# Patient Record
Sex: Female | Born: 1955 | Race: White | Hispanic: No | Marital: Single | Smoking: Former smoker
Health system: Southern US, Community
[De-identification: ages and names within clinical notes are randomized; demographics above are authoritative.]

## PROBLEM LIST (undated history)

## (undated) DIAGNOSIS — F17200 Nicotine dependence, unspecified, uncomplicated: Secondary | ICD-10-CM

## (undated) DIAGNOSIS — K219 Gastro-esophageal reflux disease without esophagitis: Secondary | ICD-10-CM

## (undated) DIAGNOSIS — E669 Obesity, unspecified: Secondary | ICD-10-CM

## (undated) DIAGNOSIS — L409 Psoriasis, unspecified: Secondary | ICD-10-CM

## (undated) DIAGNOSIS — L4050 Arthropathic psoriasis, unspecified: Secondary | ICD-10-CM

## (undated) DIAGNOSIS — G40909 Epilepsy, unspecified, not intractable, without status epilepticus: Secondary | ICD-10-CM

## (undated) DIAGNOSIS — T7840XA Allergy, unspecified, initial encounter: Secondary | ICD-10-CM

## (undated) DIAGNOSIS — I10 Essential (primary) hypertension: Secondary | ICD-10-CM

## (undated) DIAGNOSIS — I8390 Asymptomatic varicose veins of unspecified lower extremity: Secondary | ICD-10-CM

## (undated) DIAGNOSIS — C801 Malignant (primary) neoplasm, unspecified: Secondary | ICD-10-CM

## (undated) HISTORY — DX: Allergy, unspecified, initial encounter: T78.40XA

## (undated) HISTORY — DX: Psoriasis, unspecified: L40.9

## (undated) HISTORY — DX: Arthropathic psoriasis, unspecified: L40.50

## (undated) HISTORY — PX: TONSILECTOMY, ADENOIDECTOMY, BILATERAL MYRINGOTOMY AND TUBES: SHX2538

## (undated) HISTORY — DX: Asymptomatic varicose veins of unspecified lower extremity: I83.90

## (undated) HISTORY — DX: Obesity, unspecified: E66.9

## (undated) HISTORY — DX: Nicotine dependence, unspecified, uncomplicated: F17.200

## (undated) HISTORY — PX: TUBAL LIGATION: SHX77

## (undated) HISTORY — DX: Malignant (primary) neoplasm, unspecified: C80.1

## (undated) HISTORY — DX: Gastro-esophageal reflux disease without esophagitis: K21.9

## (undated) HISTORY — DX: Epilepsy, unspecified, not intractable, without status epilepticus: G40.909

## (undated) HISTORY — PX: CATARACT EXTRACTION W/ INTRAOCULAR LENS IMPLANT: SHX1309

---

## 2002-08-27 ENCOUNTER — Ambulatory Visit (HOSPITAL_COMMUNITY): Admission: RE | Admit: 2002-08-27 | Discharge: 2002-08-27 | Payer: Self-pay | Admitting: Rheumatology

## 2002-08-27 ENCOUNTER — Encounter: Payer: Self-pay | Admitting: Rheumatology

## 2005-12-26 ENCOUNTER — Ambulatory Visit: Payer: Self-pay | Admitting: Family Medicine

## 2006-08-25 ENCOUNTER — Ambulatory Visit: Payer: Self-pay | Admitting: Family Medicine

## 2006-08-25 ENCOUNTER — Other Ambulatory Visit: Admission: RE | Admit: 2006-08-25 | Discharge: 2006-08-25 | Payer: Self-pay | Admitting: Family Medicine

## 2006-12-01 ENCOUNTER — Ambulatory Visit: Payer: Self-pay | Admitting: Family Medicine

## 2007-12-18 ENCOUNTER — Ambulatory Visit: Payer: Self-pay | Admitting: Family Medicine

## 2008-01-22 ENCOUNTER — Ambulatory Visit: Payer: Self-pay | Admitting: Family Medicine

## 2008-01-25 ENCOUNTER — Ambulatory Visit: Payer: Self-pay | Admitting: Physician Assistant

## 2008-06-09 ENCOUNTER — Ambulatory Visit: Payer: Self-pay | Admitting: Family Medicine

## 2008-06-13 ENCOUNTER — Ambulatory Visit: Payer: Self-pay | Admitting: Family Medicine

## 2009-01-02 ENCOUNTER — Other Ambulatory Visit: Admission: RE | Admit: 2009-01-02 | Discharge: 2009-01-02 | Payer: Self-pay | Admitting: Family Medicine

## 2009-01-02 ENCOUNTER — Ambulatory Visit: Payer: Self-pay | Admitting: Family Medicine

## 2009-01-23 LAB — HM COLONOSCOPY

## 2009-03-13 ENCOUNTER — Ambulatory Visit (HOSPITAL_COMMUNITY): Admission: RE | Admit: 2009-03-13 | Discharge: 2009-03-13 | Payer: Self-pay | Admitting: Rheumatology

## 2009-07-24 ENCOUNTER — Ambulatory Visit: Payer: Self-pay | Admitting: Family Medicine

## 2009-08-21 ENCOUNTER — Ambulatory Visit: Payer: Self-pay | Admitting: Family Medicine

## 2009-09-04 ENCOUNTER — Ambulatory Visit: Payer: Self-pay | Admitting: Family Medicine

## 2009-10-30 ENCOUNTER — Ambulatory Visit: Payer: Self-pay | Admitting: Family Medicine

## 2010-02-19 ENCOUNTER — Ambulatory Visit: Payer: Self-pay | Admitting: Physician Assistant

## 2010-05-28 ENCOUNTER — Ambulatory Visit: Payer: Self-pay | Admitting: Family Medicine

## 2010-10-25 ENCOUNTER — Telehealth: Payer: Self-pay | Admitting: Family Medicine

## 2010-10-25 DIAGNOSIS — K219 Gastro-esophageal reflux disease without esophagitis: Secondary | ICD-10-CM

## 2010-10-25 MED ORDER — DEXLANSOPRAZOLE 60 MG PO CPDR: 60.0000 mg | DELAYED_RELEASE_CAPSULE | Freq: Every day | ORAL | Status: DC

## 2010-11-23 NOTE — Telephone Encounter (Signed)
DONE

## 2011-01-03 ENCOUNTER — Encounter: Payer: Self-pay | Admitting: Family Medicine

## 2011-01-07 ENCOUNTER — Ambulatory Visit (INDEPENDENT_AMBULATORY_CARE_PROVIDER_SITE_OTHER): Payer: Managed Care, Other (non HMO) | Admitting: Family Medicine

## 2011-01-07 ENCOUNTER — Other Ambulatory Visit: Payer: Self-pay | Admitting: Family Medicine

## 2011-01-07 ENCOUNTER — Encounter: Payer: Self-pay | Admitting: Family Medicine

## 2011-01-07 VITALS — BP 112/70 | HR 52 | Ht 67.0 in | Wt 224.0 lb

## 2011-01-07 DIAGNOSIS — L408 Other psoriasis: Secondary | ICD-10-CM

## 2011-01-07 DIAGNOSIS — L409 Psoriasis, unspecified: Secondary | ICD-10-CM

## 2011-01-07 DIAGNOSIS — K219 Gastro-esophageal reflux disease without esophagitis: Secondary | ICD-10-CM

## 2011-01-07 DIAGNOSIS — G40909 Epilepsy, unspecified, not intractable, without status epilepticus: Secondary | ICD-10-CM

## 2011-01-07 DIAGNOSIS — I10 Essential (primary) hypertension: Secondary | ICD-10-CM

## 2011-01-07 DIAGNOSIS — Z0000 Encounter for general adult medical examination without abnormal findings: Secondary | ICD-10-CM

## 2011-01-07 DIAGNOSIS — L4050 Arthropathic psoriasis, unspecified: Secondary | ICD-10-CM | POA: Insufficient documentation

## 2011-01-07 DIAGNOSIS — Z79899 Other long term (current) drug therapy: Secondary | ICD-10-CM

## 2011-01-07 DIAGNOSIS — J301 Allergic rhinitis due to pollen: Secondary | ICD-10-CM | POA: Insufficient documentation

## 2011-01-07 DIAGNOSIS — E669 Obesity, unspecified: Secondary | ICD-10-CM | POA: Insufficient documentation

## 2011-01-07 LAB — POCT URINALYSIS DIPSTICK
Glucose, UA: NEGATIVE
Ketones, UA: NEGATIVE
Leukocytes, UA: NEGATIVE
Protein, UA: NEGATIVE
Urobilinogen, UA: NEGATIVE

## 2011-01-07 LAB — CBC WITH DIFFERENTIAL/PLATELET
Basophils Absolute: 0 K/uL (ref 0.0–0.1)
Basophils Relative: 0 % (ref 0–1)
Eosinophils Absolute: 0.1 K/uL (ref 0.0–0.7)
Eosinophils Relative: 3 % (ref 0–5)
HCT: 36.9 % (ref 36.0–46.0)
Hemoglobin: 11.9 g/dL — ABNORMAL LOW (ref 12.0–15.0)
Lymphocytes Relative: 43 % (ref 12–46)
Lymphs Abs: 2 K/uL (ref 0.7–4.0)
MCH: 31.2 pg (ref 26.0–34.0)
MCHC: 32.2 g/dL (ref 30.0–36.0)
MCV: 96.6 fL (ref 78.0–100.0)
Monocytes Absolute: 0.5 K/uL (ref 0.1–1.0)
Monocytes Relative: 11 % (ref 3–12)
Neutro Abs: 2 K/uL (ref 1.7–7.7)
Neutrophils Relative %: 43 % (ref 43–77)
Platelets: 248 K/uL (ref 150–400)
RBC: 3.82 MIL/uL — ABNORMAL LOW (ref 3.87–5.11)
RDW: 15 % (ref 11.5–15.5)
WBC: 4.6 K/uL (ref 4.0–10.5)

## 2011-01-07 LAB — COMPREHENSIVE METABOLIC PANEL WITH GFR
ALT: 17 U/L (ref 0–35)
AST: 18 U/L (ref 0–37)
Albumin: 4.5 g/dL (ref 3.5–5.2)
Alkaline Phosphatase: 87 U/L (ref 39–117)
BUN: 14 mg/dL (ref 6–23)
CO2: 27 meq/L (ref 19–32)
Calcium: 9.7 mg/dL (ref 8.4–10.5)
Chloride: 104 meq/L (ref 96–112)
Creat: 0.61 mg/dL (ref 0.50–1.10)
Glucose, Bld: 91 mg/dL (ref 70–99)
Potassium: 4.4 meq/L (ref 3.5–5.3)
Sodium: 141 meq/L (ref 135–145)
Total Bilirubin: 0.3 mg/dL (ref 0.3–1.2)
Total Protein: 7.9 g/dL (ref 6.0–8.3)

## 2011-01-07 LAB — LIPID PANEL
Cholesterol: 232 mg/dL — ABNORMAL HIGH (ref 0–200)
HDL: 105 mg/dL
LDL Cholesterol: 111 mg/dL — ABNORMAL HIGH (ref 0–99)
Total CHOL/HDL Ratio: 2.2 ratio
Triglycerides: 81 mg/dL
VLDL: 16 mg/dL (ref 0–40)

## 2011-01-07 NOTE — Patient Instructions (Signed)
Stay on your present medications. Make dietary adjustments especially in regard to her we'll hydrate.

## 2011-01-07 NOTE — Progress Notes (Signed)
  Subjective:    Patient ID: Kimberly Butler, female    DOB: Apr 27, 1956, 55 y.o.   MRN: [id number]  HPI She is here for a complete examination. She is being followed by her rheumatologist and recently had a PPD which was negative. She will need blood work for the immune modulating medication she is on. She continues on Dilantin and has not had difficulty with this. Her reflux symptoms are under good control with Dexilant. Her allergies are also under good control. Social history Rose reviewed. The main issue is that she had to put her dog down and 2 weeks later her mother died. Her mother was in hospice. She does seem to be handling this well but is having some difficulty with her brother. She helped take care of her mother and has been using walking is in good therapy. She has had some slight foot discomfort because of this.   Review of Systems  Constitutional: Negative.   HENT: Negative.   Eyes: Negative.   Respiratory: Negative.   Cardiovascular: Negative.   Gastrointestinal: Negative.  Negative for anal bleeding.  Genitourinary: Negative.   Musculoskeletal: Positive for arthralgias.       Objective:   Physical Exam BP 112/70  Pulse 52  Ht 5\' 7"  (1.702 m)  Wt 224 lb (101.606 kg)  BMI 35.08 kg/m2  General Appearance:    Alert, cooperative, no distress, appears stated age  Head:    Normocephalic, without obvious abnormality, atraumatic  Eyes:    PERRL, conjunctiva/corneas clear, EOM's intact, fundi    benign  Ears:    Normal TM's and external ear canals  Nose:   Nares normal, mucosa normal, no drainage or sinus   tenderness  Throat:   Lips, mucosa, and tongue normal; teeth and gums normal  Neck:   Supple, no lymphadenopathy;  thyroid:  no   enlargement/tenderness/nodules; no carotid   bruit or JVD  Back:    Spine nontender, no curvature, ROM normal, no CVA     tenderness  Lungs:     Clear to auscultation bilaterally without wheezes, rales or     ronchi; respirations unlabored  Chest  Wall:    No tenderness or deformity   Heart:    Regular rate and rhythm, S1 and S2 normal, no murmur, rub   or gallop  Breast Exam:    Deferred to GYN  Abdomen:     Soft, non-tender, nondistended, normoactive bowel sounds,    no masses, no hepatosplenomegaly  Genitalia:    Deferred to GYN     Extremities:   No clubbing, cyanosis or edema  Pulses:   2+ and symmetric all extremities  Skin:   Skin color, texture, turgor normal, no rashes or lesions  Lymph nodes:   Cervical, supraclavicular, and axillary nodes normal  Neurologic:   CNII-XII intact, normal strength, sensation and gait; reflexes 2+ and symmetric throughout          Psych:   Normal mood, affect, hygiene and grooming.           Assessment & Plan:  Seizure disorder. Psoriasis. Allergic rhinitis. GERD. Recent family stress. Obesity. I encouraged her to get involved with bereavement counseling through hospice. Encouraged her to continue with her physical activities and wear comfortable shoes to help with her foot discomfort. Continue on present medications. Routine blood screening also ordered.

## 2011-01-08 ENCOUNTER — Other Ambulatory Visit: Payer: Self-pay

## 2011-01-08 ENCOUNTER — Telehealth: Payer: Self-pay

## 2011-01-08 NOTE — Telephone Encounter (Signed)
Left message for pt she needed to take a multi vit other than that labs look good

## 2011-01-14 ENCOUNTER — Other Ambulatory Visit: Payer: Self-pay

## 2011-01-14 MED ORDER — OLMESARTAN MEDOXOMIL-HCTZ 20-12.5 MG PO TABS: 1.0000 | ORAL_TABLET | Freq: Every day | ORAL | Status: DC

## 2011-03-04 ENCOUNTER — Encounter: Payer: Self-pay | Admitting: Family Medicine

## 2011-03-04 ENCOUNTER — Other Ambulatory Visit: Payer: Self-pay | Admitting: Family Medicine

## 2011-03-04 ENCOUNTER — Ambulatory Visit (INDEPENDENT_AMBULATORY_CARE_PROVIDER_SITE_OTHER): Payer: Managed Care, Other (non HMO) | Admitting: Family Medicine

## 2011-03-04 VITALS — BP 110/70 | Temp 100.5°F | Wt 227.0 lb

## 2011-03-04 DIAGNOSIS — H6690 Otitis media, unspecified, unspecified ear: Secondary | ICD-10-CM

## 2011-03-04 DIAGNOSIS — L4050 Arthropathic psoriasis, unspecified: Secondary | ICD-10-CM

## 2011-03-04 DIAGNOSIS — H6691 Otitis media, unspecified, right ear: Secondary | ICD-10-CM

## 2011-03-04 MED ORDER — AMOXICILLIN 875 MG PO TABS: 875.0000 mg | ORAL_TABLET | Freq: Two times a day (BID) | ORAL | Status: AC

## 2011-03-04 NOTE — Patient Instructions (Signed)
Take all the antibiotic and if not totally back to normal ,call me. 

## 2011-03-04 NOTE — Progress Notes (Signed)
  Subjective:    Patient ID: Kimberly Butler, female    DOB: [birth year], 55 y.o.   MRN: [id number]  HPI She has a three-day history of sore throat, nasal congestion, headache, fatigue, fever and chills. No coughing. She does have underlying psoriatic arthritis and continues on her methotrexate. She does not smoke and has seasonal allergies.   Review of Systems     Objective:   Physical Exam alert and in no distress. Tympanic membrane on the right is dull and vascular, left is normal, canals are normal. Throat is clear. Tonsils are normal. Neck is supple without adenopathy or thyromegaly. Cardiac exam shows a regular sinus rhythm without murmurs or gallops. Lungs are clear to auscultation.        Assessment & Plan:   1. ROM (right otitis media)   2. Psoriatic arthritis    I will treat her with Amoxil. She will continue on her other medications. She will call if further difficulty.

## 2011-03-28 IMAGING — CR DG CHEST 2V
2 series · 2 of 2 positions shown · non-contrast
Comparison: None

CLINICAL DATA: Prior to starting immunosuppressive therapy for
arthritis.

CHEST - 2 VIEW

[w chest pa]
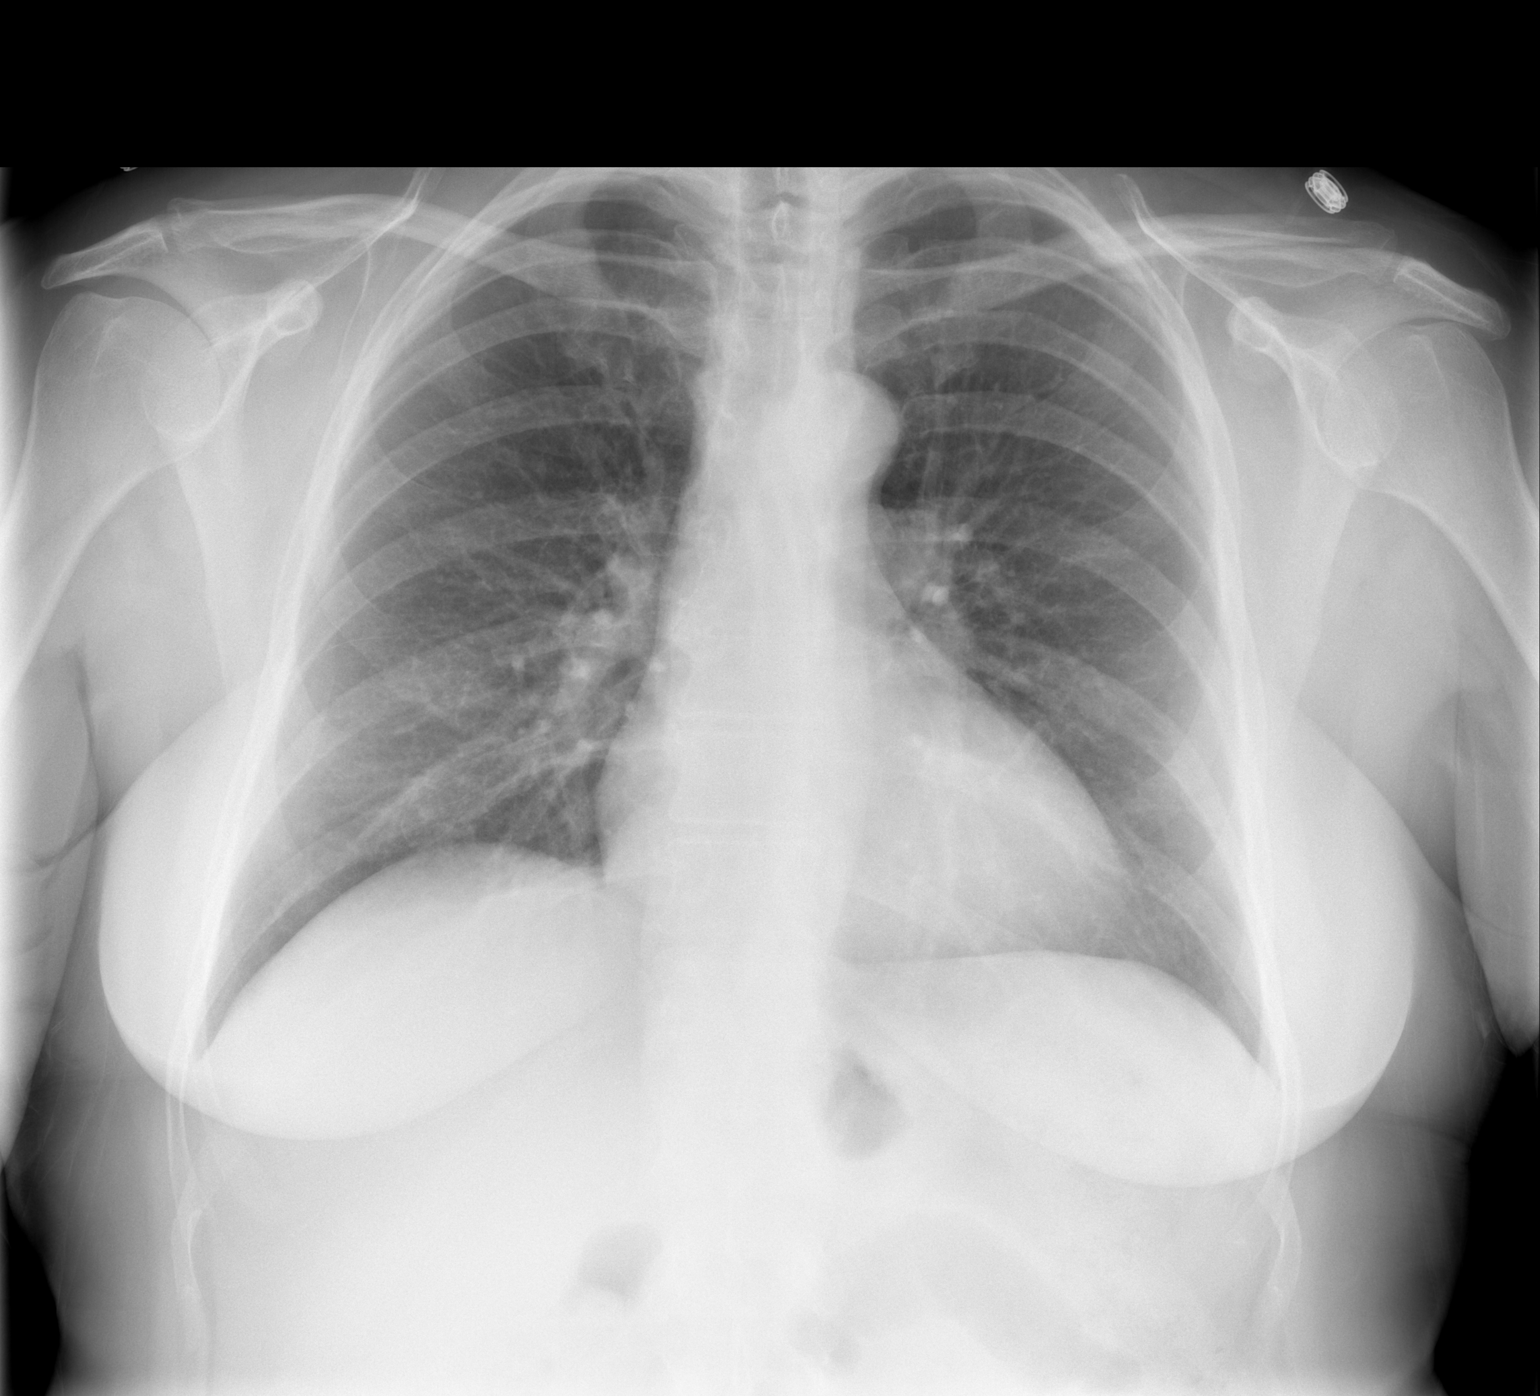

[w chest lat]
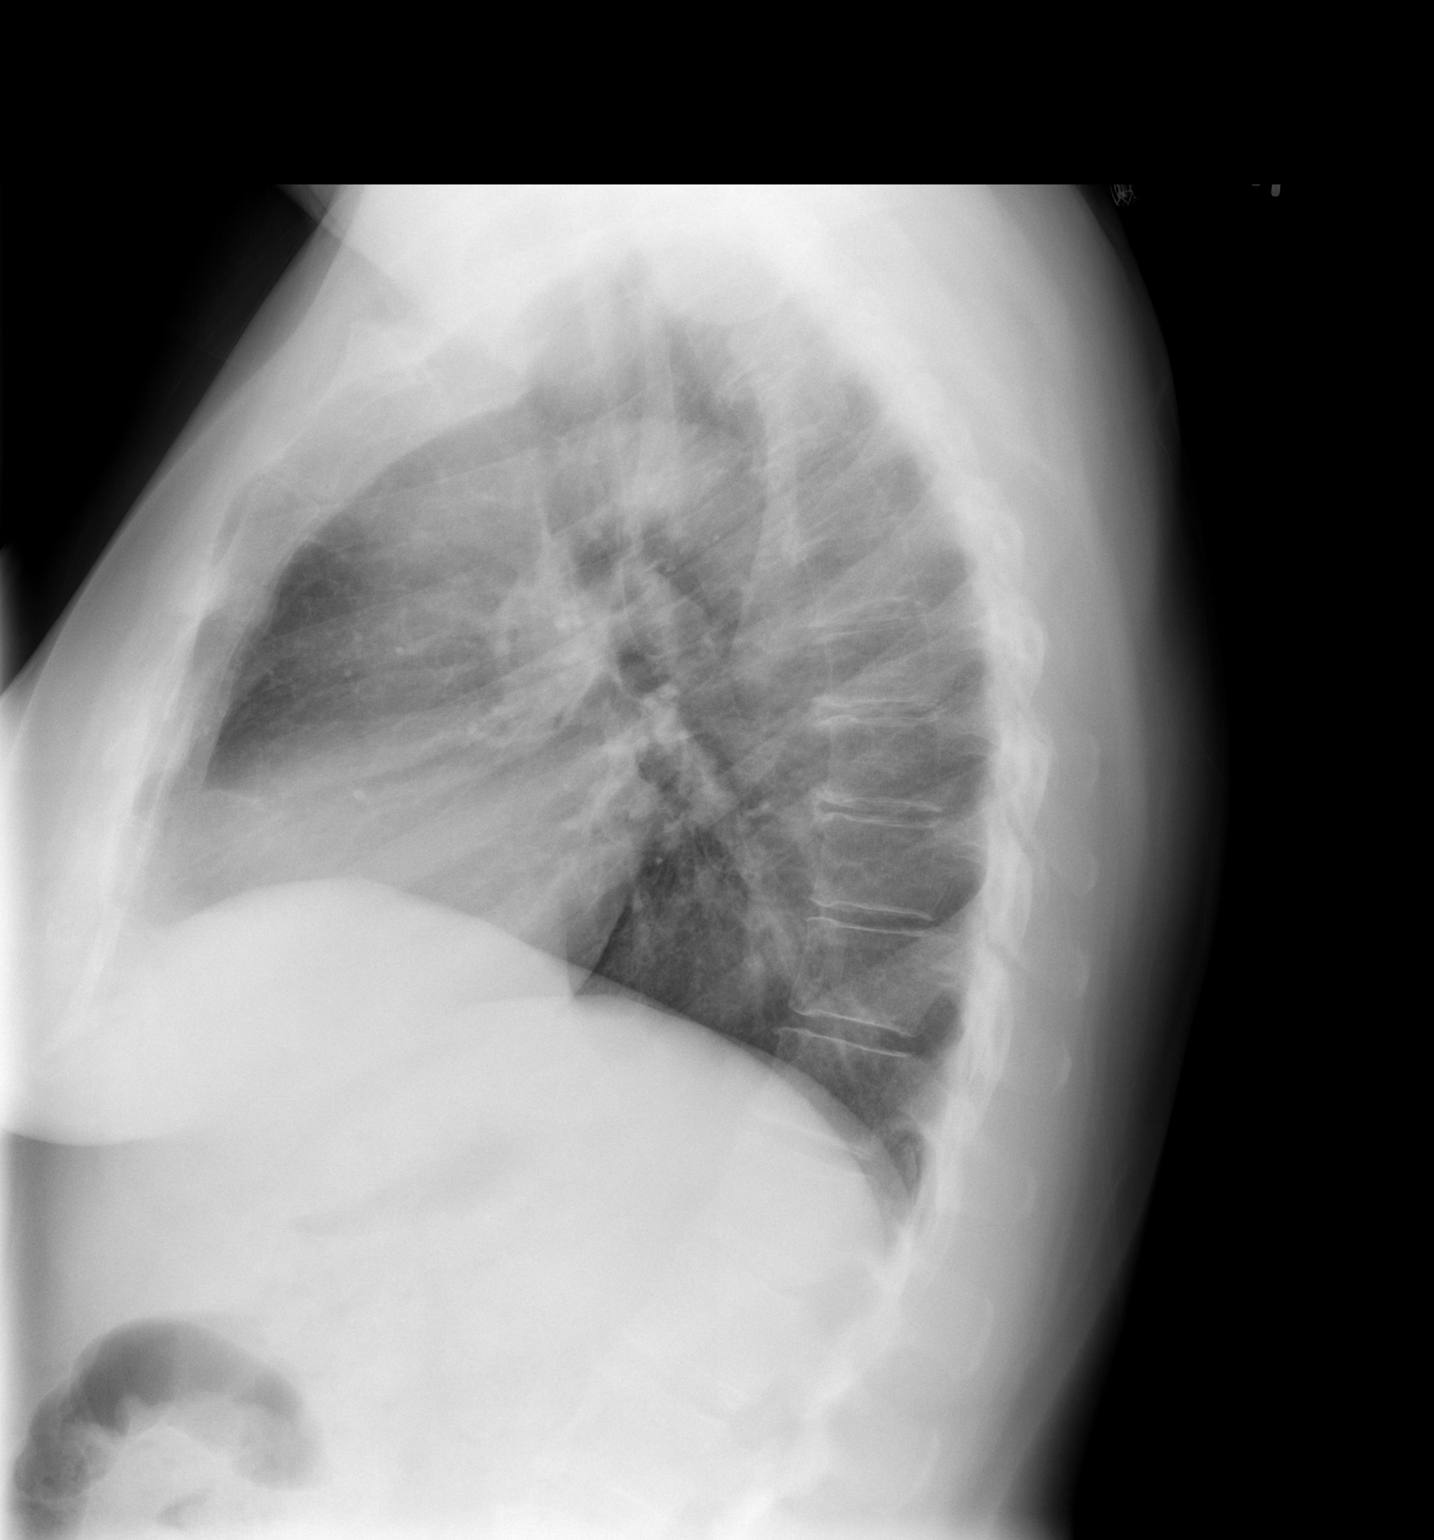

[2 of 2 positions shown; findings below may reference images not displayed]

FINDINGS: Heart size and mediastinal contours are normal.  Both
lungs are clear.  There is no evidence of pleural effusion.  No
mass or adenopathy identified.
IMPRESSION: No active cardiopulmonary disease.

## 2011-04-13 ENCOUNTER — Other Ambulatory Visit: Payer: Self-pay | Admitting: Family Medicine

## 2011-04-15 NOTE — Telephone Encounter (Signed)
Is this okay to refill? 

## 2011-05-15 ENCOUNTER — Other Ambulatory Visit: Payer: Self-pay | Admitting: Family Medicine

## 2011-07-30 ENCOUNTER — Encounter: Payer: Self-pay | Admitting: Medical

## 2011-07-30 ENCOUNTER — Ambulatory Visit (INDEPENDENT_AMBULATORY_CARE_PROVIDER_SITE_OTHER): Payer: Managed Care, Other (non HMO) | Admitting: Medical

## 2011-07-30 VITALS — BP 112/78 | HR 60 | Temp 98.5°F | Resp 16 | Wt 229.0 lb

## 2011-07-30 DIAGNOSIS — J40 Bronchitis, not specified as acute or chronic: Secondary | ICD-10-CM

## 2011-07-30 DIAGNOSIS — R509 Fever, unspecified: Secondary | ICD-10-CM

## 2011-07-30 DIAGNOSIS — R059 Cough, unspecified: Secondary | ICD-10-CM

## 2011-07-30 DIAGNOSIS — R05 Cough: Secondary | ICD-10-CM

## 2011-07-30 DIAGNOSIS — Z79899 Other long term (current) drug therapy: Secondary | ICD-10-CM

## 2011-07-30 MED ORDER — AMOXICILLIN-POT CLAVULANATE 875-125 MG PO TABS: 1.0000 | ORAL_TABLET | Freq: Two times a day (BID) | ORAL | Status: DC

## 2011-07-30 NOTE — Patient Instructions (Signed)
Acute Bronchitis Bronchitis is a problem of the air tubes leading to your lungs. Acute means the illness started quickly. In this condition, the lining of those tubes becomes puffy (swollen) and can leak fluid. This makes it harder for air to get in and out of your lungs. You may cough a lot. This is because the air tubes are narrow. Bronchitis is most often caused by a virus. Medicines that kill germs (antibiotics) may be needed with germ (bacteria) infections for people who:  Smoke.   Have lasting (chronic) lung problems.   Are elderly.  HOME CARE  Rest.   Drink enough water and fluids to keep the pee clear or pale yellow.   Only take medicine as told by your doctor.   Medicines may be prescribed that will open up the airways. This will help make breathing easier.   Bronchitis usually gets better on its own in a few days.  Recovery from some problems (symptoms) of bronchitis may be slow. You should start feeling a little better after 2 to 3 days. Coughing may last for 3 to 4 weeks. GET HELP RIGHT AWAY IF:  You or your child has a temperature by mouth above 101, not controlled by medicine.   Chills or chest pain develops.   You or your child develops very bad shortness of breath.   There is bloody saliva mixed with mucus (sputum).   You or your child throws up (vomits) often, loses too much fluid (dehydration), feels faint, or has a very bad headache.   You or your child does not improve after 1 week of treatment.  MAKE SURE YOU:   Understand these instructions.   Will watch this condition.   Will get help right away if you or your child is not doing well or gets worse.  Document Released: 11/13/2007 Document Re-Released: 08/21/2009 [employer] Patient Information 2011 [employer].  

## 2011-07-30 NOTE — Progress Notes (Signed)
Subjective:  Kimberly Butler is a 57 y.o. female who presents for chest congestion.  She started feeling bad last Thursday about 5 days ago with itchy throat, back pain and chest pressure.  Then started having cough, chest congestion, nasal congestion, hoarseness, ears popping, and began having fever 101.2 last night and chills.  She works around [employer] so is around sick contacts.   She denies recent surgery, long travel, no bed ridden statu, no hx/o DVT or PE, but she is on Enbrel and sees Kimberly Butler/rheumatology regularly.  She die have a flu shot this year.   She is a former smoker.  Using Tylenol and Mucinex DM for symptoms. No other aggravating or relieving factors.  No other c/o.  The following portions of the patient's history were reviewed and updated as appropriate: allergies, current medications, past family history, past medical history, past social history, past surgical history and problem list.  Past Medical History  Diagnosis Date  . Epilepsy   . Psoriasis   . Varicose veins   . Smoker   . Obesity   . Allergy     RHINITIS  . GERD (gastroesophageal reflux disease)   . Cancer     BASAL CELL   ROS: Gen: +fever and chills, no weight changes Skin: no rash Heart: no CP, palpitations, but +longstanding edema, no worse than usual Lungs: no SOB or wheezing GI: no pain, NVD GU: negative   Objective:   Filed Vitals:   07/30/11 0936  BP: 112/78  Pulse: 60  Temp: 98.5 F (36.9 C)  Resp: 16    General appearance: Alert, WD/WN, no distress, ill appearing                             Skin: warm, no rash, no diaphoresis                           Head: + sinus tenderness                            Eyes: conjunctiva normal, corneas clear, PERRLA                            Ears: TMs with mild serous fluid behind TMs bilat, external ear canals normal                          Nose: septum midline, turbinates swollen, with erythema and clear discharge             Mouth/throat:  MMM, tongue normal, mild pharyngeal erythema                           Neck: supple, no adenopathy, no thyromegaly, nontender                          Heart: RRR, normal S1, S2, no murmurs                         Lungs: +bronchial breath sounds, +scattered rhonchi, no wheezes, no rales                Extremities: no edema, nontender    CXR: no obvious pneumonia, cardiomegaly,  or acute findings.  Some hilar congestion suggestive of bronchitis.  Will send for over read.  Assessment and Plan:   Encounter Diagnoses  Name Primary?  . Cough Yes  . Fever   . High risk medication use    Prescription given today for Augmentin as below.  Discussed diagnosis and treatment of bronchitis.  Suggested symptomatic OTC remedies for cough and congestion.  Nasal saline spray for nasal congestion.  Call/return in 2-3 days if symptoms are worse or not improving.   Call report in 2-3 days to let me know how she is doing.

## 2011-08-05 ENCOUNTER — Other Ambulatory Visit: Payer: Self-pay | Admitting: Medical

## 2011-08-05 ENCOUNTER — Telehealth: Payer: Self-pay | Admitting: Family Medicine

## 2011-08-05 MED ORDER — LEVOFLOXACIN 500 MG PO TABS: 500.0000 mg | ORAL_TABLET | Freq: Every day | ORAL | Status: AC

## 2011-08-05 NOTE — Telephone Encounter (Signed)
Message copied by Janeice Robinson on Mon Aug 05, 2011 10:51 AM ------      Message from: Aleen Campi, DAVID S      Created: Mon Aug 05, 2011  8:01 AM       Chest xray over read suggests pneumonia.  Call and see how she is doing?  Radiology recommended repeat CXR in 8-10 weeks to make sure the finding on xray resolve.  Thus, I recommend CXR repeat 8-10 wk, strongly consider stopping tobacco, and if not improving let me know.

## 2011-08-05 NOTE — Telephone Encounter (Signed)
Patient states that her chest still feels full, she has no taste buds or smell of food, and she is still coughing but she is sleeping better. She states that she has stop her arithrists medication. CLS     Patient was notified of her xray report. CLS

## 2011-08-06 NOTE — Telephone Encounter (Signed)
I called the patient per Kristian Covey PA-C to let her know that he was going to send out a new antibotic to the pharmacy for her. CLS

## 2011-08-16 ENCOUNTER — Encounter: Payer: Self-pay | Admitting: Medical

## 2011-08-16 ENCOUNTER — Ambulatory Visit (INDEPENDENT_AMBULATORY_CARE_PROVIDER_SITE_OTHER): Payer: Managed Care, Other (non HMO) | Admitting: Medical

## 2011-08-16 VITALS — BP 112/78 | HR 58 | Temp 98.0°F | Resp 14 | Wt 230.0 lb

## 2011-08-16 DIAGNOSIS — R21 Rash and other nonspecific skin eruption: Secondary | ICD-10-CM

## 2011-08-16 DIAGNOSIS — I498 Other specified cardiac arrhythmias: Secondary | ICD-10-CM

## 2011-08-16 DIAGNOSIS — R001 Bradycardia, unspecified: Secondary | ICD-10-CM

## 2011-08-16 DIAGNOSIS — J189 Pneumonia, unspecified organism: Secondary | ICD-10-CM

## 2011-08-16 NOTE — Progress Notes (Signed)
Subjective: Here for f/u. I saw her about 2 weeks ago for possible pneumonia.  She took the Levaquin and now feels fine.   She has had no more cough or fever.   She is read to restart her Enbrel and Methotrexate but needs clearance from [place].  She has developed a small rash on back of right leg.  The lower leg is itchy.  Its the same leg she has chronic swelling in.  No other new c/o.    Past Medical History  Diagnosis Date  . Epilepsy   . Psoriasis   . Varicose veins   . Smoker   . Obesity   . Allergy     RHINITIS  . GERD (gastroesophageal reflux disease)   . Cancer     BASAL CELL   ROS: Gen: no fever, chills, wt changes Skin: right lower leg rash Heart: no CP, palpitations, but occasional DOE, she attributes to deconditioning Lungs: no SOB, wheezing GI: negative    Objective:   Physical Exam  Filed Vitals:   08/16/11 0814  BP: 112/78  Pulse: 58  Temp: 98 F (36.7 C)  Resp: 14    General appearance: alert, no distress, WD/WN Skin: right popliteal region with patch of flat erythema, few scattered 1-2 mm flat areas of erythema of right lower leg, but no other worrisome lesions Oral cavity: MMM, no lesions Neck: supple, no lymphadenopathy, no thyromegaly, no masses Heart: bradycardia, but otherwise RRR, normal S1, S2, no murmurs Lungs: CTA bilaterally, no wheezes, rhonchi, or rales Extremities:  Chronic right lower leg edema asymmetric compared to left lower leg Pulses: 1+ symmetric LE   Assessment and Plan :    Encounter Diagnoses  Name Primary?  . Pneumonia Yes  . Rash   . Bradycardia     Pneumonia - resolved.  We discussed her recent CXR over read findings.  Per radiology, repeat CXR in 8 weeks to demonstrate clearance/ no other abnormalities.  She can restart her Enbrel and Methotrexate now.  Rash - topical OTC hydrocortisone cream TID, c/t lotion regularly, and call if not resolving.  Bradycardia - not new, similar to prior visits.  We discussed her high  risk medications.   She wants to have EKG when she returns for repeat CXR in 8 wks.  She does note prior stress test about 5-7 years ago in [facility].  Follow-up 8 wk.

## 2011-08-17 ENCOUNTER — Other Ambulatory Visit: Payer: Self-pay | Admitting: Family Medicine

## 2011-09-30 ENCOUNTER — Encounter: Payer: Self-pay | Admitting: Internal Medicine

## 2011-10-07 ENCOUNTER — Encounter: Payer: Self-pay | Admitting: Family Medicine

## 2011-10-07 ENCOUNTER — Ambulatory Visit (INDEPENDENT_AMBULATORY_CARE_PROVIDER_SITE_OTHER): Payer: Managed Care, Other (non HMO) | Admitting: Family Medicine

## 2011-10-07 VITALS — BP 122/82 | HR 68 | Wt 232.0 lb

## 2011-10-07 DIAGNOSIS — R9389 Abnormal findings on diagnostic imaging of other specified body structures: Secondary | ICD-10-CM

## 2011-10-07 DIAGNOSIS — R918 Other nonspecific abnormal finding of lung field: Secondary | ICD-10-CM

## 2011-10-07 NOTE — Progress Notes (Signed)
  Subjective:    Patient ID: Kimberly Butler, female    DOB: 1955-08-15, 56 y.o.   MRN: [id number]  HPI She was seen approximately 2 months ago and treated for possible pneumonia. The x-ray did show questionable changes and recommended followup. Presently she is having no chest pain, shortness of breath, coughing or congestion.   Review of Systems     Objective:   Physical Exam Alert and in no distress. Lungs are clear to auscultation. Cardiac exam shows regular rhythm without murmurs or gallops.       Assessment & Plan:   1. Abnormal chest x-ray    the chest x-ray appears normal however I will wait for radiology to read this.

## 2011-10-11 ENCOUNTER — Encounter: Payer: Self-pay | Admitting: Family Medicine

## 2011-10-22 ENCOUNTER — Other Ambulatory Visit: Payer: Self-pay | Admitting: Family Medicine

## 2011-10-22 NOTE — Telephone Encounter (Signed)
Is this ok?

## 2011-10-22 NOTE — Telephone Encounter (Signed)
I called the medicine in 

## 2011-10-22 NOTE — Telephone Encounter (Signed)
Dexilant called in

## 2011-11-06 ENCOUNTER — Other Ambulatory Visit: Payer: Self-pay | Admitting: Family Medicine

## 2011-11-06 NOTE — Telephone Encounter (Signed)
Is this ok?

## 2011-11-06 NOTE — Telephone Encounter (Signed)
Have her set up an appointment before the prescription runs out. I just called 90 day supply

## 2011-11-06 NOTE — Telephone Encounter (Signed)
Left message for pt on home phone to please call and set up an appt

## 2011-12-09 ENCOUNTER — Telehealth: Payer: Self-pay | Admitting: Family Medicine

## 2011-12-11 NOTE — Telephone Encounter (Signed)
P.A. FOR BENICAR DENIED.  APPT 7/8

## 2011-12-16 ENCOUNTER — Encounter: Payer: Self-pay | Admitting: Family Medicine

## 2011-12-16 ENCOUNTER — Ambulatory Visit (INDEPENDENT_AMBULATORY_CARE_PROVIDER_SITE_OTHER): Payer: Managed Care, Other (non HMO) | Admitting: Family Medicine

## 2011-12-16 ENCOUNTER — Other Ambulatory Visit: Payer: Self-pay

## 2011-12-16 VITALS — BP 122/80 | HR 46 | Wt 232.0 lb

## 2011-12-16 DIAGNOSIS — G5602 Carpal tunnel syndrome, left upper limb: Secondary | ICD-10-CM

## 2011-12-16 DIAGNOSIS — G5600 Carpal tunnel syndrome, unspecified upper limb: Secondary | ICD-10-CM

## 2011-12-16 DIAGNOSIS — G40909 Epilepsy, unspecified, not intractable, without status epilepticus: Secondary | ICD-10-CM

## 2011-12-16 DIAGNOSIS — E669 Obesity, unspecified: Secondary | ICD-10-CM

## 2011-12-16 DIAGNOSIS — I10 Essential (primary) hypertension: Secondary | ICD-10-CM

## 2011-12-16 MED ORDER — LISINOPRIL-HYDROCHLOROTHIAZIDE 10-12.5 MG PO TABS: 1.0000 | ORAL_TABLET | Freq: Every day | ORAL | Status: DC

## 2011-12-16 MED ORDER — GLUCOSE BLOOD VI STRP: ORAL_STRIP | Status: DC

## 2011-12-16 MED ORDER — FREESTYLE LANCETS MISC: Status: DC

## 2011-12-16 NOTE — Progress Notes (Signed)
  Subjective:    Patient ID: Kimberly Butler, female    DOB: 17-Aug-1955, 56 y.o.   MRN: [id number]  HPI She is here for medication management visit. She complains of left wrist and shoulder discomfort. She describes a pain in her wrist was driving and also at night. She has been using a splint which he says helps. She also states that sometimes when she lies on the left shoulder, she will have pain that radiates down her arm. Lying flat on her back does not cause difficulty. She continues on her Dilantin for an underlying seizure disorder. She is also on blood pressure medication needs to be switched due to cost.she continues on methotrexate.   Review of Systems     Objective:   Physical Exam        Assessment & Plan:   1. Carpal tunnel syndrome of left wrist    2. Seizure disorder  Phenytoin level, total  3. Hypertension  lisinopril-hydrochlorothiazide (PRINZIDE,ZESTORETIC) 10-12.5 MG per tablet  4. Obesity (BMI 30-39.9)     I will switch her to lisinopril. Discussed diet and exercise with her to reduce her chances of becoming a diabetic. She will continue on her other medications. Recheck here in one month.

## 2011-12-16 NOTE — Telephone Encounter (Signed)
SENT IN LANCETS AND TEST STRIPS FREE STYLE

## 2011-12-17 ENCOUNTER — Other Ambulatory Visit: Payer: Self-pay

## 2011-12-17 MED ORDER — DEXLANSOPRAZOLE 60 MG PO CPDR: 60.0000 mg | DELAYED_RELEASE_CAPSULE | Freq: Every day | ORAL | Status: DC

## 2011-12-17 MED ORDER — DILANTIN 100 MG PO CAPS: ORAL_CAPSULE | ORAL | Status: DC

## 2011-12-17 NOTE — Telephone Encounter (Signed)
Sent med in per jcl 

## 2011-12-24 ENCOUNTER — Other Ambulatory Visit: Payer: Self-pay

## 2011-12-24 NOTE — Telephone Encounter (Signed)
Error sent diabetes supply for pt

## 2012-01-13 ENCOUNTER — Ambulatory Visit: Payer: Managed Care, Other (non HMO) | Admitting: Family Medicine

## 2012-01-20 ENCOUNTER — Other Ambulatory Visit: Payer: Self-pay | Admitting: Family Medicine

## 2012-02-17 ENCOUNTER — Telehealth: Payer: Self-pay | Admitting: Internal Medicine

## 2012-02-17 ENCOUNTER — Ambulatory Visit (INDEPENDENT_AMBULATORY_CARE_PROVIDER_SITE_OTHER): Payer: Managed Care, Other (non HMO) | Admitting: Family Medicine

## 2012-02-17 ENCOUNTER — Encounter: Payer: Self-pay | Admitting: Family Medicine

## 2012-02-17 VITALS — BP 122/80 | HR 56 | Wt 235.0 lb

## 2012-02-17 DIAGNOSIS — I10 Essential (primary) hypertension: Secondary | ICD-10-CM

## 2012-02-17 DIAGNOSIS — L409 Psoriasis, unspecified: Secondary | ICD-10-CM

## 2012-02-17 DIAGNOSIS — J301 Allergic rhinitis due to pollen: Secondary | ICD-10-CM

## 2012-02-17 DIAGNOSIS — L408 Other psoriasis: Secondary | ICD-10-CM

## 2012-02-17 MED ORDER — MOMETASONE FUROATE 50 MCG/ACT NA SUSP: 2.0000 | Freq: Every day | NASAL | Status: DC

## 2012-02-17 NOTE — Telephone Encounter (Signed)
Done

## 2012-02-17 NOTE — Progress Notes (Signed)
  Subjective:    Patient ID: Kimberly Butler, female    DOB: [birth year], 56 y.o.   MRN: [id number]  HPI She is here for blood pressure recheck. She was switched on her last visit to lisinopril. She has no side effects specifically coughing. She also complains of a one-year history decreased sensation of smell. She does note some sinus pressure, PND. No fever, chills, headache. She does take Claritin and Nasonex. She does note that when she takes her methotrexate, her allergy symptoms worsen.   Review of Systems     Objective:   Physical Exam alert and in no distress. Tympanic membranes and canals are normal. Throat is clear. Tonsils are normal. Neck is supple without adenopathy or thyromegaly. Cardiac exam shows a regular sinus rhythm without murmurs or gallops. Lungs are clear to auscultation. Nasal mucosa is normal with no tenderness over sinuses       Assessment & Plan:   1. Psoriasis   2. Hypertension   3. Allergic rhinitis due to pollen    continue present medication regimen but use the allergies meds on a regular basis. If she has difficulty, she will call me for possibly switching to a different regimen.

## 2012-02-17 NOTE — Addendum Note (Signed)
Addended by: Barbette Or A on: 02/17/2012 10:40 AM   Modules accepted: Orders

## 2012-02-17 NOTE — Telephone Encounter (Signed)
Needs a refill on nasonex sent to the pharmacy

## 2012-02-17 NOTE — Patient Instructions (Signed)
Use the Claritin and Nasonex regularly and if that stops working let me know

## 2012-03-11 ENCOUNTER — Ambulatory Visit (INDEPENDENT_AMBULATORY_CARE_PROVIDER_SITE_OTHER): Payer: Managed Care, Other (non HMO) | Admitting: Medical

## 2012-03-11 ENCOUNTER — Encounter: Payer: Self-pay | Admitting: Medical

## 2012-03-11 ENCOUNTER — Other Ambulatory Visit: Payer: Self-pay | Admitting: Medical

## 2012-03-11 VITALS — BP 122/80 | HR 60 | Temp 97.9°F | Resp 16 | Wt 230.0 lb

## 2012-03-11 DIAGNOSIS — R21 Rash and other nonspecific skin eruption: Secondary | ICD-10-CM

## 2012-03-11 DIAGNOSIS — R609 Edema, unspecified: Secondary | ICD-10-CM

## 2012-03-11 DIAGNOSIS — L509 Urticaria, unspecified: Secondary | ICD-10-CM

## 2012-03-11 DIAGNOSIS — R600 Localized edema: Secondary | ICD-10-CM

## 2012-03-11 DIAGNOSIS — Z79899 Other long term (current) drug therapy: Secondary | ICD-10-CM

## 2012-03-11 LAB — CBC WITH DIFFERENTIAL/PLATELET
Basophils Absolute: 0 10*3/uL (ref 0.0–0.1)
Basophils Relative: 0 % (ref 0–1)
Eosinophils Absolute: 0.2 10*3/uL (ref 0.0–0.7)
Eosinophils Relative: 4 % (ref 0–5)
MCH: 31.6 pg (ref 26.0–34.0)
MCHC: 33.6 g/dL (ref 30.0–36.0)
Neutrophils Relative %: 46 % (ref 43–77)
Platelets: 262 10*3/uL (ref 150–400)
RBC: 3.73 MIL/uL — ABNORMAL LOW (ref 3.87–5.11)
RDW: 14.2 % (ref 11.5–15.5)

## 2012-03-11 LAB — COMPREHENSIVE METABOLIC PANEL
ALT: 15 U/L (ref 0–35)
Alkaline Phosphatase: 77 U/L (ref 39–117)
Creat: 0.61 mg/dL (ref 0.50–1.10)
Sodium: 139 mEq/L (ref 135–145)
Total Bilirubin: 0.3 mg/dL (ref 0.3–1.2)
Total Protein: 7.2 g/dL (ref 6.0–8.3)

## 2012-03-11 MED ORDER — LOSARTAN POTASSIUM-HCTZ 50-12.5 MG PO TABS: 1.0000 | ORAL_TABLET | Freq: Every day | ORAL | Status: DC

## 2012-03-11 NOTE — Progress Notes (Signed)
Subjective: Here for c/o rash and swelling.   Sunday started having swelling of left eyelid, but then eyes started watering that night.  Next morning orbit seemed swollen on the left.  Later that day started taking benadryl, but then yesterday afternoon started getting bumps on right arm and then rash began on neck, chin, both arms.  No rash on legs, torso.  Rash is itchy/burning.  The only recent change was that Lisinopril HCT was added few months ago since insurer would no longer cover Benicar HCT.  Past Medical History  Diagnosis Date  . Epilepsy   . Psoriasis   . Varicose veins   . Smoker   . Obesity   . Allergy     RHINITIS  . GERD (gastroesophageal reflux disease)   . Cancer     BASAL CELL  . Psoriatic arthritis     Review of Systems Constitutional: -fever, -chills, -sweats ENT: -runny nose, -ear pain, -sore throat Cardiology:  -chest pain, -palpitations, -edema Respiratory: -cough, -shortness of breath, -wheezing Gastroenterology: -abdominal pain, -nausea, -vomiting, -diarrhea, -constipation  Musculoskeletal: -arthralgias, -myalgias, -joint swelling, -back pain Ophthalmology: -vision changes Urology: -dysuria, -difficulty urinating, -hematuria, -urinary frequency, -urgency Neurology: -headache, -weakness, -tingling, -numbness     Objective:   Physical Exam  Filed Vitals:   03/11/12 0820  BP: 122/80  Pulse: 60  Temp: 97.9 F (36.6 C)  Resp: 16    General appearance: alert, no distress, WD/WN Skin: urticarial appearing erythematous rash broadly of chin, neck and to lesser extent patches on forearms, orbits and face seem slightly swollen, lips not particularly swollen HEENT: normocephalic, sclerae anicteric, conjunctiva normal appearing, TMs pearly, nares patent, no discharge or erythema, pharynx normal Oral cavity: MMM, no lesions Neck: supple, no lymphadenopathy, no thyromegaly, no masses Heart: RRR, normal S1, S2, no murmurs Lungs: CTA bilaterally, no wheezes,  rhonchi, or rales MSK: nontender Neuro: normal strength  Assessment and Plan :    Encounter Diagnoses  Name Primary?  . Periorbital edema Yes  . Rash   . Urticaria   . Encounter for long-term (current) use of other medications    i called and discussed rash with Dr. Corliss Skains, patient's rheumatologist.  She did not feel like this related to etanercept or methotrexate or dermatomyositis.   At this point we will check some labs as she is due for monitoring, will have her increase benadryl to TID for the next 2-3 days, and we will stop lisinopril HCT and change to Losartan HCT in the event this is related to recent onset of lisinopril.   Advised she call Friday for symptoms update, and call/return in the meantime if new or worsening symptoms.  Follow-up pending labs

## 2012-03-13 ENCOUNTER — Telehealth: Payer: Self-pay | Admitting: Internal Medicine

## 2012-03-13 ENCOUNTER — Other Ambulatory Visit: Payer: Self-pay | Admitting: Medical

## 2012-03-13 LAB — IRON AND TIBC
Iron: 94 ug/dL (ref 42–145)
UIBC: 189 ug/dL (ref 125–400)

## 2012-03-13 MED ORDER — PREDNISONE 10 MG PO TABS: ORAL_TABLET | ORAL | Status: DC

## 2012-03-13 NOTE — Telephone Encounter (Signed)
Pt notified and shane talked to pt

## 2012-03-13 NOTE — Telephone Encounter (Signed)
Pt is not better. Pt is swollen and rash is still on face. And wants to know what to do. [facility] [place]

## 2012-03-13 NOTE — Addendum Note (Signed)
Addended by: Dayle Points on: 03/13/2012 11:22 AM   Modules accepted: Orders

## 2012-03-13 NOTE — Telephone Encounter (Signed)
Pt called back.  She notes that her neck rash has actually improved, but she notes that her psoriasis patches seem to be a little worse than usual.  Recently was cutting back on methotrexate per her rheumatologist, but she has concerns about what to do next.  After discussing this with her, advised she not begin the prednisone taper and increase methotrexate back to where it was prior.  Call report monday

## 2012-03-13 NOTE — Telephone Encounter (Signed)
If rash is no better, then hold off on Enbrel for a few days and begin short taper of prednisone steroid.  Call back Monday.  If rash still not going away, then consider biopsy of rash here or referral to dermatology.

## 2012-03-23 ENCOUNTER — Encounter: Payer: Self-pay | Admitting: Family Medicine

## 2012-04-22 ENCOUNTER — Encounter: Payer: Self-pay | Admitting: Medical

## 2012-04-22 ENCOUNTER — Ambulatory Visit (INDEPENDENT_AMBULATORY_CARE_PROVIDER_SITE_OTHER): Payer: Managed Care, Other (non HMO) | Admitting: Medical

## 2012-04-22 ENCOUNTER — Encounter: Payer: Self-pay | Admitting: Family Medicine

## 2012-04-22 VITALS — BP 122/70 | HR 60 | Temp 97.5°F | Resp 16 | Wt 233.0 lb

## 2012-04-22 DIAGNOSIS — H6980 Other specified disorders of Eustachian tube, unspecified ear: Secondary | ICD-10-CM

## 2012-04-22 DIAGNOSIS — H6990 Unspecified Eustachian tube disorder, unspecified ear: Secondary | ICD-10-CM

## 2012-04-22 DIAGNOSIS — R42 Dizziness and giddiness: Secondary | ICD-10-CM

## 2012-04-22 LAB — GLUCOSE, POCT (MANUAL RESULT ENTRY): POC Glucose: 77 mg/dl (ref 70–99)

## 2012-04-22 NOTE — Progress Notes (Signed)
Subjective: Been getting dizzy the last 2 weeks.  getting worse.  Took her dog to the vet this morning, felt dizzy driving.   Room seemed to be spinning around while in the waiting room at the vet.  Has felt tired and fatigued.  This has started to become a daily symptom.  Initially was once weekly.  Had it 3 times this morning.  Usually lasts for seconds, but this morning longer.  Didn't seem to go away this morning.  Denies dizziness currently, but is fatigued.   Hasn't been sleeping good at night.  Sometimes gets nauseated with the dizzy spells.  Denies syncope, no palpations.  All seems to be in her head.  Has chronic numbness of left arm, but no new numbness, tingling, weakness.  No vision changes. No hearing changes, no slurred speech, but thinking/mental processing seems slow.   Not skipping meals.  Had a banana this morning, but after the dizzy spell at the vet, had a cookie.  Doesn't feel like her self in general.  Feels like if she jerked her head around, would get dizzy easily.   She has had some nasal congestion and sneezing.  Has had a few loose stools, but no other GI or GU symptoms.   No SOB, CP.  Has chronic edema of right ankle but no other new swelling.    Past Medical History  Diagnosis Date  . Epilepsy   . Psoriasis   . Varicose veins   . Smoker   . Obesity   . Allergy     RHINITIS  . GERD (gastroesophageal reflux disease)   . Cancer     BASAL CELL  . Psoriatic arthritis    Current Outpatient Prescriptions on File Prior to Visit  Medication Sig Dispense Refill  . calcium-vitamin D (OSCAL WITH D) 500-200 MG-UNIT per tablet Take 2 tablets by mouth daily.        Marland Kitchen dexlansoprazole (DEXILANT) 60 MG capsule Take 1 capsule (60 mg total) by mouth daily.  30 capsule  11  . DILANTIN 100 MG ER capsule Pt is to take 2 capsules 2 times daily  360 capsule  3  . Etanercept (ENBREL [place]) Inject into the skin.      . folic acid (FOLVITE) 1 MG tablet Take 1 mg by mouth daily.        . iron  polysaccharides (NIFEREX 60) 40-20 MG capsule Take 1 capsule by mouth daily with breakfast.        . losartan-hydrochlorothiazide (HYZAAR) 50-12.5 MG per tablet Take 1 tablet by mouth daily.  30 tablet  3  . methotrexate 1 G injection Inject 25 mg into the muscle once.       . mometasone (NASONEX) 50 MCG/ACT nasal spray Place 2 sprays into the nose daily.  17 g  11  . Multiple Vitamins-Minerals (MULTIVITAMIN WITH MINERALS) tablet Take 1 tablet by mouth daily.         ROS as in HPI    Objective:   Physical Exam  Filed Vitals:   04/22/12 1037  BP: 122/70  Pulse: 60  Temp: 97.5 F (36.4 C)  Resp: 16    General appearance: alert, no distress, WD/WN HEENT: normocephalic, sclerae anicteric, mild serous fluid behind right TM, left TM flat, turbinate edema, but no discharge or erythema, pharynx normal Oral cavity: MMM, no lesions Neck: supple, no lymphadenopathy, no thyromegaly, no masses, no bruits Heart: RRR, normal S1, S2, no murmurs Lungs: CTA bilaterally, no wheezes, rhonchi, or  rales Pulses: 2+ symmetric, upper and lower extremities, normal cap refill Neuro: CN2-12 intact, normal DTRs, sensation, normal finger to nose, romberg normal, no cerebellar signs, nonfocal  Assessment and Plan :    Encounter Diagnoses  Name Primary?  . Vertigo Yes  . Eustachian tube dysfunction   . Dizzy    Her symptoms suggest BPPV as most likely diagnosis likely related to some eustachian tube dysfunction.  Doesn't sound syncopal, no new focal signs, blood sugar normal currently.  Advised OTC Zyrtec QHS for a week, get back on the nasonex BID for a week.  Increase water intake, don't make sudden movements, and avoid driving if too dizzy.  If dizziness persists or worsens, recheck as she is on medications and has medical conditions that complicate the dizziness picture.  Follow-up 1wk or sooner if not improving.

## 2012-04-22 NOTE — Patient Instructions (Signed)
Begin back on Nasonex 2 sprays per nostril twice daily for a week, then once daily for the next few weeks  Consider taking Zyrtec 10mg  OTC at bedtime for a week.  This is an antihistamine.    Increase water intake.  Rest.  Don't drive if feeling too dizzy.  If not improving in 1 week, or if new symptoms, then return.    Vertigo Vertigo means you feel like you or your surroundings are moving when they are not. Vertigo can be dangerous if it occurs when you are at work, driving, or performing difficult activities.  CAUSES  Vertigo occurs when there is a conflict of signals sent to your brain from the visual and sensory systems in your body. There are many different causes of vertigo, including:  Infections, especially in the inner ear.  A bad reaction to a drug or misuse of alcohol and medicines.  Withdrawal from drugs or alcohol.  Rapidly changing positions, such as lying down or rolling over in bed.  A migraine headache.  Decreased blood flow to the brain.  Increased pressure in the brain from a head injury, infection, tumor, or bleeding. SYMPTOMS  You may feel as though the world is spinning around or you are falling to the ground. Because your balance is upset, vertigo can cause nausea and vomiting. You may have involuntary eye movements (nystagmus). DIAGNOSIS  Vertigo is usually diagnosed by physical exam. If the cause of your vertigo is unknown, your caregiver may perform imaging tests, such as an MRI scan (magnetic resonance imaging). TREATMENT  Most cases of vertigo resolve on their own, without treatment. Depending on the cause, your caregiver may prescribe certain medicines. If your vertigo is related to body position issues, your caregiver may recommend movements or procedures to correct the problem. In rare cases, if your vertigo is caused by certain inner ear problems, you may need surgery. HOME CARE INSTRUCTIONS   Follow your caregiver's instructions.  Avoid  driving.  Avoid operating heavy machinery.  Avoid performing any tasks that would be dangerous to you or others during a vertigo episode.  Tell your caregiver if you notice that certain medicines seem to be causing your vertigo. Some of the medicines used to treat vertigo episodes can actually make them worse in some people. SEEK IMMEDIATE MEDICAL CARE IF:   Your medicines do not relieve your vertigo or are making it worse.  You develop problems with talking, walking, weakness, or using your arms, hands, or legs.  You develop severe headaches.  Your nausea or vomiting continues or gets worse.  You develop visual changes.  A family member notices behavioral changes.  Your condition gets worse. MAKE SURE YOU:  Understand these instructions.  Will watch your condition.  Will get help right away if you are not doing well or get worse. Document Released: 03/06/2005 Document Revised: 08/19/2011 Document Reviewed: 12/13/2010 [facility] Patient Information 2013 [place], [place].

## 2012-04-23 ENCOUNTER — Encounter: Payer: Self-pay | Admitting: Medical

## 2012-05-22 ENCOUNTER — Encounter: Payer: Self-pay | Admitting: Medical

## 2012-05-22 ENCOUNTER — Ambulatory Visit (INDEPENDENT_AMBULATORY_CARE_PROVIDER_SITE_OTHER): Payer: Managed Care, Other (non HMO) | Admitting: Medical

## 2012-05-22 VITALS — BP 130/80 | HR 60 | Temp 97.9°F | Resp 16 | Wt 233.0 lb

## 2012-05-22 DIAGNOSIS — H6980 Other specified disorders of Eustachian tube, unspecified ear: Secondary | ICD-10-CM

## 2012-05-22 DIAGNOSIS — H9319 Tinnitus, unspecified ear: Secondary | ICD-10-CM

## 2012-05-22 MED ORDER — AMOXICILLIN 875 MG PO TABS: 875.0000 mg | ORAL_TABLET | Freq: Two times a day (BID) | ORAL | Status: DC

## 2012-05-22 NOTE — Progress Notes (Signed)
Subjective: Here for ongoing ear pressure.  There is a noise in her ears, humming sound, both ears, sometimes popping sounds.  Has to have tv or background noise to drown this out.  Yesterday had some heavy eyes, runny nose, been having some sinus pressure and headache ongoing.  Occasionally dizziness, but the vertigo/dizziness she saw me for a month ago has mostly resolved.  No fever, nausea, vomiting, chest pain, SOB, wheezing, cough, sore throat.   Denies vision change.  No weakness, numbness, tingling. Fatigue.  Been using nasal spray Nasonex, 2 sprays every morning.  After last visit she has used Zyrtec along with Nasonex and this combo seemed to clear things up, but stopped zyrtec after a week.  Seemed ok until the last 1-2 weeks.  Of note, she has psoriatic arthritis, on methotrexate and Enbrel.  Past Medical History  Diagnosis Date  . Epilepsy   . Psoriasis   . Varicose veins   . Smoker   . Obesity   . Allergy     RHINITIS  . GERD (gastroesophageal reflux disease)   . Cancer     BASAL CELL  . Psoriatic arthritis    ROS as in HPI    Objective:   Physical Exam  Filed Vitals:   05/22/12 1355  BP: 130/80  Pulse: 60  Temp: 97.9 F (36.6 C)  Resp: 16    General appearance: alert, no distress, WD/WN, pleasant HEENT: normocephalic, sclerae anicteric, left TM flat, right TM relatively normal appearing, nares patent, no discharge or erythema, pharynx normal Oral cavity: MMM, no lesions Neck: supple, no lymphadenopathy, no thyromegaly, no masses Heart: RRR, normal S1, S2, no murmurs Lungs: CTA bilaterally, no wheezes, rhonchi, or rales Pulses normal CN 2-12 intact  Assessment and Plan :    Encounter Diagnoses  Name Primary?  . Eustachian tube dysfunction Yes  . Tinnitus    Eustachian tube dysfunction - hold the Enbrel Saturday.  Begin back on zyrtec, c/t Nasonex daily, hydrate well, and if this doesn't seem to help in the next 3-4 days, or if worsening with sinus  pressure, begin Amoxicillin.    Tinnitus - likely related to the eustachian tube dysfunction.  If not resolving in 2wk, then consider ENT referral.  Follow-up with call report 1wk.

## 2012-05-22 NOTE — Patient Instructions (Signed)
Continue nasonex daily in the morning, 2 sprays in each nostril.   Begin back on Zyrtec OTC daily for 1-2 weeks.   Use this at bedtime.    Hold the Enbrel until you finish the antibiotic.  Begin Amoxicillin antibiotic twice daily for 10 days.  If not improving let me know.

## 2012-06-27 ENCOUNTER — Other Ambulatory Visit: Payer: Self-pay | Admitting: Medical

## 2012-07-28 ENCOUNTER — Encounter: Payer: Managed Care, Other (non HMO) | Admitting: Family Medicine

## 2012-08-03 ENCOUNTER — Encounter: Payer: Managed Care, Other (non HMO) | Admitting: Family Medicine

## 2012-08-19 ENCOUNTER — Telehealth: Payer: Self-pay | Admitting: Family Medicine

## 2012-08-19 NOTE — Telephone Encounter (Signed)
Prior authorization received for  Dexilant DR 60mg , faxed copy of prior auth to [facility] [address], [employer]

## 2012-08-26 ENCOUNTER — Telehealth: Payer: Self-pay | Admitting: Family Medicine

## 2012-08-26 MED ORDER — ESOMEPRAZOLE MAGNESIUM 40 MG PO CPDR: 40.0000 mg | DELAYED_RELEASE_CAPSULE | Freq: Two times a day (BID) | ORAL | Status: DC

## 2012-08-26 MED ORDER — ESOMEPRAZOLE MAGNESIUM 40 MG PO CPDR: 40.0000 mg | DELAYED_RELEASE_CAPSULE | Freq: Every day | ORAL | Status: DC

## 2012-08-26 NOTE — Telephone Encounter (Signed)
Pt called and stated that dexilant is too expensive. She states her portion is 120.00 dollars and she cant afford that. She states that although dexilant works better she wants to go back on something she has been on before at maybe a higher dose or something. [facility] uses [facility] [address], please call pt with status update.

## 2012-08-26 NOTE — Telephone Encounter (Signed)
Her Dexilant was getting too expensive. She would like to be switched back to a med she has had in the past but at a higher dose.I will: Nexium twice a day dosing

## 2012-10-06 ENCOUNTER — Encounter: Payer: Self-pay | Admitting: Internal Medicine

## 2012-10-08 ENCOUNTER — Other Ambulatory Visit: Payer: Self-pay | Admitting: Family Medicine

## 2012-10-08 ENCOUNTER — Telehealth: Payer: Self-pay | Admitting: Family Medicine

## 2012-10-08 ENCOUNTER — Other Ambulatory Visit: Payer: Self-pay | Admitting: Medical

## 2012-10-08 MED ORDER — LOSARTAN POTASSIUM-HCTZ 50-12.5 MG PO TABS: ORAL_TABLET | ORAL | Status: DC

## 2012-10-08 NOTE — Telephone Encounter (Signed)
Needs refill losartan-hctz  cpe 11/16/12

## 2012-10-08 NOTE — Telephone Encounter (Signed)
Patient needs refill on losartin-hctz  cpe 11/16/12

## 2012-10-30 ENCOUNTER — Encounter: Payer: Self-pay | Admitting: Internal Medicine

## 2012-11-16 ENCOUNTER — Ambulatory Visit (INDEPENDENT_AMBULATORY_CARE_PROVIDER_SITE_OTHER): Payer: BC Managed Care – PPO | Admitting: Family Medicine

## 2012-11-16 ENCOUNTER — Telehealth: Payer: Self-pay | Admitting: Family Medicine

## 2012-11-16 ENCOUNTER — Encounter: Payer: Self-pay | Admitting: Family Medicine

## 2012-11-16 ENCOUNTER — Other Ambulatory Visit (HOSPITAL_COMMUNITY)
Admission: RE | Admit: 2012-11-16 | Discharge: 2012-11-16 | Disposition: A | Discharge status: Home / Self Care | Payer: BC Managed Care – PPO | Source: Ambulatory Visit | Attending: Family Medicine | Admitting: Family Medicine

## 2012-11-16 VITALS — BP 124/76 | HR 64 | Ht 67.0 in | Wt 236.0 lb

## 2012-11-16 DIAGNOSIS — G40909 Epilepsy, unspecified, not intractable, without status epilepticus: Secondary | ICD-10-CM

## 2012-11-16 DIAGNOSIS — L408 Other psoriasis: Secondary | ICD-10-CM

## 2012-11-16 DIAGNOSIS — Z79899 Other long term (current) drug therapy: Secondary | ICD-10-CM

## 2012-11-16 DIAGNOSIS — Z01419 Encounter for gynecological examination (general) (routine) without abnormal findings: Secondary | ICD-10-CM | POA: Insufficient documentation

## 2012-11-16 DIAGNOSIS — J301 Allergic rhinitis due to pollen: Secondary | ICD-10-CM

## 2012-11-16 DIAGNOSIS — L409 Psoriasis, unspecified: Secondary | ICD-10-CM

## 2012-11-16 DIAGNOSIS — I10 Essential (primary) hypertension: Secondary | ICD-10-CM

## 2012-11-16 DIAGNOSIS — Z0000 Encounter for general adult medical examination without abnormal findings: Secondary | ICD-10-CM

## 2012-11-16 DIAGNOSIS — K219 Gastro-esophageal reflux disease without esophagitis: Secondary | ICD-10-CM

## 2012-11-16 DIAGNOSIS — E669 Obesity, unspecified: Secondary | ICD-10-CM

## 2012-11-16 DIAGNOSIS — M461 Sacroiliitis, not elsewhere classified: Secondary | ICD-10-CM

## 2012-11-16 LAB — CBC WITH DIFFERENTIAL/PLATELET
Basophils Absolute: 0 10*3/uL (ref 0.0–0.1)
Lymphocytes Relative: 44 % (ref 12–46)
Lymphs Abs: 1.7 10*3/uL (ref 0.7–4.0)
Neutro Abs: 1.6 10*3/uL — ABNORMAL LOW (ref 1.7–7.7)
Neutrophils Relative %: 42 % — ABNORMAL LOW (ref 43–77)
Platelets: 220 10*3/uL (ref 150–400)
RBC: 3.75 MIL/uL — ABNORMAL LOW (ref 3.87–5.11)
RDW: 14.5 % (ref 11.5–15.5)
WBC: 3.8 10*3/uL — ABNORMAL LOW (ref 4.0–10.5)

## 2012-11-16 LAB — HEMOCCULT GUIAC POC 1CARD (OFFICE)

## 2012-11-16 LAB — POCT URINALYSIS DIPSTICK
Glucose, UA: NEGATIVE
Leukocytes, UA: NEGATIVE
Spec Grav, UA: 1.02
Urobilinogen, UA: NEGATIVE

## 2012-11-16 MED ORDER — DILANTIN 100 MG PO CAPS: ORAL_CAPSULE | ORAL | Status: DC

## 2012-11-16 MED ORDER — ESOMEPRAZOLE MAGNESIUM 40 MG PO CPDR: 40.0000 mg | DELAYED_RELEASE_CAPSULE | Freq: Two times a day (BID) | ORAL | Status: DC

## 2012-11-16 MED ORDER — LOSARTAN POTASSIUM-HCTZ 50-12.5 MG PO TABS: ORAL_TABLET | ORAL | Status: DC

## 2012-11-16 MED ORDER — MOMETASONE FUROATE 50 MCG/ACT NA SUSP: 2.0000 | Freq: Every day | NASAL | Status: DC

## 2012-11-16 NOTE — Telephone Encounter (Signed)
Please send a cope of labs over to Dr. Shan Levans per pt.

## 2012-11-16 NOTE — Progress Notes (Signed)
Subjective:    Patient ID: Kimberly Butler, female    DOB: 1955-08-26, 57 y.o.   MRN: [id number]  HPI She is here for a complete examination. She does have a 2 month history of low back pain that is worse with standing. She has been no numbness, tingling or weakness. She has been doing stretching exercises without much benefit. She has been taking one Advil per day without much success. She does have underlying psoriatic arthritis and continues to see her rheumatologist. She continues on appropriate meds for that. She also has a history of seizures but has not had one in several years and is very stable on Dilantin. Her allergies are under good control on her present medication regimen. Her lifestyle in regard to weight is unchanged. She does very little physical activity. Social history was reviewed.   Review of Systems  Constitutional: Negative.   HENT: Negative.   Eyes: Negative.   Respiratory: Negative.   Cardiovascular: Negative.   Endocrine: Negative.   Genitourinary: Negative.   Allergic/Immunologic: Negative.   Neurological: Negative.   Hematological: Negative.   Psychiatric/Behavioral: Negative.        Objective:   Physical Exam BP 124/76  Pulse 64  Ht 5\' 7"  (1.702 m)  Wt 236 lb (107.049 kg)  BMI 36.95 kg/m2  General Appearance:    Alert, cooperative, no distress, appears stated age  Head:    Normocephalic, without obvious abnormality, atraumatic  Eyes:    PERRL, conjunctiva/corneas clear, EOM's intact, fundi    benign  Ears:    Normal TM's and external ear canals  Nose:   Nares normal, mucosa normal, no drainage or sinus   tenderness  Throat:   Lips, mucosa, and tongue normal; teeth and gums normal  Neck:   Supple, no lymphadenopathy;  thyroid:  no   enlargement/tenderness/nodules; no carotid   bruit or JVD  Back:    Spine nontender, no curvature, ROM normal, no CVA     tenderness  Lungs:     Clear to auscultation bilaterally without wheezes, rales or     ronchi;  respirations unlabored  Chest Wall:    No tenderness or deformity   Heart:    Regular rate and rhythm, S1 and S2 normal, no murmur, rub   or gallop  Breast Exam:    No tenderness, masses, or nipple discharge or inversion.      No axillary lymphadenopathy  Abdomen:     Soft, non-tender, nondistended, normoactive bowel sounds,    no masses, no hepatosplenomegaly  Genitalia:    Normal external genitalia without lesions.  BUS and vagina normal; cervix without lesions, or cervical motion tenderness. No abnormal vaginal discharge.  Uterus and adnexa not enlarged, nontender, no masses.  Pap performed  Rectal:    Normal tone, no masses or tenderness; guaiac negative stool  Extremities:   No clubbing, cyanosis or edema.back exam shows good motion and normal lumbar curve. She is slightly tender over left SI joint. Negative straight leg raising.  Pulses:   2+ and symmetric all extremities  Skin:   Skin color, texture, turgor normal, no rashes or lesions  Lymph nodes:   Cervical, supraclavicular, and axillary nodes normal  Neurologic:   CNII-XII intact, normal strength, sensation and gait; reflexes 2+ and symmetric throughout          Psych:   Normal mood, affect, hygiene and grooming.         Assessment & Plan:  Routine general medical examination at a health  care facility - Plan: Urinalysis Dipstick, CBC with Differential, Comprehensive metabolic panel, Lipid panel, Hemoccult - 1 Card (office), Cytology - PAP [place]  Seizure disorder - Plan: DILANTIN 100 MG ER capsule  GERD (gastroesophageal reflux disease) - Plan: esomeprazole (NEXIUM) 40 MG capsule  Hypertension - Plan: losartan-hydrochlorothiazide (HYZAAR) 50-12.5 MG per tablet  Obesity (BMI 30-39.9)  Psoriasis  Allergic rhinitis due to pollen - Plan: mometasone (NASONEX) 50 MCG/ACT nasal spray  Encounter for long-term (current) use of other medications - Plan: CBC with Differential, Comprehensive metabolic panel, Lipid  panel  Sacroiliitis she will continue on her present medications. Discussed treatment of her back with heat and stretching. Discussed the fact that this could possibly be related to her underlying psoriatic arthritis with conservative care would be appropriate.

## 2012-11-16 NOTE — Patient Instructions (Signed)
Do the heat to your back as well as stretching exercises on a daily basis

## 2012-11-17 LAB — COMPREHENSIVE METABOLIC PANEL
ALT: 17 U/L (ref 0–35)
AST: 20 U/L (ref 0–37)
Albumin: 4.2 g/dL (ref 3.5–5.2)
CO2: 27 mEq/L (ref 19–32)
Calcium: 9.1 mg/dL (ref 8.4–10.5)
Chloride: 105 mEq/L (ref 96–112)
Creat: 0.65 mg/dL (ref 0.50–1.10)
Potassium: 4.2 mEq/L (ref 3.5–5.3)
Sodium: 142 mEq/L (ref 135–145)
Total Protein: 7.1 g/dL (ref 6.0–8.3)

## 2012-11-17 LAB — LIPID PANEL
LDL Cholesterol: 95 mg/dL (ref 0–99)
Total CHOL/HDL Ratio: 2.3 Ratio

## 2012-11-17 NOTE — Telephone Encounter (Signed)
Sent through epic

## 2012-11-17 NOTE — Progress Notes (Signed)
Quick Note:  CALLED PT CELL# PT INFORMED AND COPY SENT TO DR.DEVESHWAR PT VERBALIZED UNDERSTANDING ______

## 2012-11-19 ENCOUNTER — Other Ambulatory Visit: Payer: Self-pay | Admitting: Family Medicine

## 2012-12-24 ENCOUNTER — Telehealth: Payer: Self-pay | Admitting: Internal Medicine

## 2012-12-24 ENCOUNTER — Other Ambulatory Visit: Payer: Self-pay | Admitting: Family Medicine

## 2012-12-24 NOTE — Telephone Encounter (Signed)
Pt is taking dexliant not nexium so she wanted me to disconitnue the nexium

## 2013-01-03 ENCOUNTER — Other Ambulatory Visit: Payer: Self-pay | Admitting: Family Medicine

## 2013-02-23 ENCOUNTER — Encounter: Payer: Self-pay | Admitting: Family Medicine

## 2013-02-23 ENCOUNTER — Ambulatory Visit (INDEPENDENT_AMBULATORY_CARE_PROVIDER_SITE_OTHER): Payer: BC Managed Care – PPO | Admitting: Family Medicine

## 2013-02-23 VITALS — BP 130/82 | HR 52 | Temp 98.0°F | Wt 238.0 lb

## 2013-02-23 DIAGNOSIS — L4050 Arthropathic psoriasis, unspecified: Secondary | ICD-10-CM

## 2013-02-23 DIAGNOSIS — J019 Acute sinusitis, unspecified: Secondary | ICD-10-CM

## 2013-02-23 MED ORDER — AMOXICILLIN-POT CLAVULANATE 875-125 MG PO TABS: 1.0000 | ORAL_TABLET | Freq: Two times a day (BID) | ORAL | Status: DC

## 2013-02-23 NOTE — Patient Instructions (Signed)
Take all the antibiotic and call me if you not totally back to normal. Follow the rules of your rheumatologist concerning your injections. If you get worse call me.

## 2013-02-23 NOTE — Progress Notes (Signed)
  Subjective:    Patient ID: Kimberly Butler, female    DOB: 1955/07/26, 57 y.o.   MRN: [id number]  HPI She has a 8-day history started with sore throat followed by PND and greenish drainage from the right eye, nasal congestion, dry cough, diaphoresis, fatigue and malaise. No sore throat or earache. She stopped her Embril when she got sick   Review of Systems     Objective:   Physical Exam alert and in no distress. Tympanic membranes and canals are normal. Throat is clear. Tonsils are normal. Neck is supple without adenopathy or thyromegaly. Cardiac exam shows a regular sinus rhythm without murmurs or gallops. Lungs are clear to auscultation. Conjunctiva are noninjected and no exudate is noted. She is tender over frontal and maxillary sinuses.       Assessment & Plan:  Acute sinusitis - Plan: amoxicillin-clavulanate (AUGMENTIN) 875-125 MG per tablet  Psoriatic arthritis  she will call the recommendations of her rheumatologist concerning starting her shots again. Call if further difficulty.

## 2013-03-15 ENCOUNTER — Other Ambulatory Visit: Payer: Self-pay | Admitting: Family Medicine

## 2013-03-23 ENCOUNTER — Other Ambulatory Visit: Payer: Self-pay | Admitting: Family Medicine

## 2013-03-24 ENCOUNTER — Other Ambulatory Visit: Payer: Self-pay | Admitting: Family Medicine

## 2013-03-26 ENCOUNTER — Telehealth: Payer: Self-pay | Admitting: Family Medicine

## 2013-03-27 NOTE — Telephone Encounter (Signed)
Make sure that I get a copy of the MRI

## 2013-04-01 ENCOUNTER — Encounter: Payer: Self-pay | Admitting: Family Medicine

## 2013-04-02 ENCOUNTER — Other Ambulatory Visit: Payer: Self-pay | Admitting: Family Medicine

## 2013-07-21 ENCOUNTER — Telehealth: Payer: Self-pay | Admitting: Family Medicine

## 2013-07-23 ENCOUNTER — Telehealth: Payer: Self-pay | Admitting: Family Medicine

## 2013-07-23 NOTE — Telephone Encounter (Signed)
P.A. [place] approved til 06/09/38, pt informed & faxed pharmacy

## 2013-07-23 NOTE — Telephone Encounter (Signed)
Please call concerning dexilant PA, about of meds will need samples

## 2013-08-23 ENCOUNTER — Other Ambulatory Visit: Payer: Self-pay | Admitting: Family Medicine

## 2013-08-23 NOTE — Telephone Encounter (Signed)
Is this ok to refill?  

## 2013-08-24 ENCOUNTER — Encounter: Payer: Self-pay | Admitting: Family Medicine

## 2013-08-24 ENCOUNTER — Ambulatory Visit (INDEPENDENT_AMBULATORY_CARE_PROVIDER_SITE_OTHER): Payer: BC Managed Care – PPO | Admitting: Family Medicine

## 2013-08-24 ENCOUNTER — Ambulatory Visit: Payer: BC Managed Care – PPO | Admitting: Family Medicine

## 2013-08-24 VITALS — BP 112/74 | HR 60 | Wt 240.0 lb

## 2013-08-24 DIAGNOSIS — R42 Dizziness and giddiness: Secondary | ICD-10-CM

## 2013-08-24 DIAGNOSIS — R110 Nausea: Secondary | ICD-10-CM

## 2013-08-24 MED ORDER — PROMETHAZINE HCL 25 MG/ML IJ SOLN
25.0000 mg | Freq: Once | INTRAMUSCULAR | Status: AC
  Administered 2013-08-24: 25 mg via INTRAMUSCULAR

## 2013-08-24 MED ORDER — MECLIZINE HCL 12.5 MG PO TABS: 12.5000 mg | ORAL_TABLET | Freq: Three times a day (TID) | ORAL | Status: DC | PRN

## 2013-08-24 NOTE — Progress Notes (Signed)
   Subjective:    Patient ID: Kimberly Butler, female    DOB: 02/08/1956, 58 y.o.   MRN: [id number]  HPI She woke up this morning complaining of diaphoresis, nausea and dizziness, weakness. She took her medications this morning. She is breakfast and this had no effect. No blurred or double vision. She notes any head motion will cause dizziness however he she states perfectly still, the nausea and dizziness diminished.  Review of Systems     Objective:   Physical Exam alert and in no distress. EOMI Tympanic membranes and canals are normal. Throat is clear. Tonsils are normal. Neck is supple without adenopathy or thyromegaly. Cardiac exam shows a regular sinus rhythm without murmurs or gallops. Lungs are clear to auscultation. No carotid bruits noted. DTRs normal       Assessment & Plan:  Vertigo - Plan: meclizine (ANTIVERT) 12.5 MG tablet  Nausea alone - Plan: meclizine (ANTIVERT) 12.5 MG tablet, promethazine (PHENERGAN) injection 25 mg  she will be given Antivert and is to call me if continued difficulty. Explained that I did not think there was any indication of this being related to her seizure activity. She is in agreement with this.

## 2013-08-24 NOTE — Patient Instructions (Signed)
You can take 2 meclizine at a time if the one pill 3 times a day does not work.

## 2013-09-26 ENCOUNTER — Other Ambulatory Visit: Payer: Self-pay | Admitting: Family Medicine

## 2013-10-03 ENCOUNTER — Other Ambulatory Visit: Payer: Self-pay | Admitting: Family Medicine

## 2013-10-04 NOTE — Telephone Encounter (Signed)
Is this okay to refill? 

## 2013-10-04 NOTE — Telephone Encounter (Signed)
Is this ok to refill?  

## 2013-10-25 LAB — HM MAMMOGRAPHY

## 2013-11-02 ENCOUNTER — Encounter: Payer: Self-pay | Admitting: Internal Medicine

## 2013-11-04 ENCOUNTER — Encounter: Payer: Self-pay | Admitting: Family Medicine

## 2013-11-18 ENCOUNTER — Encounter: Payer: Self-pay | Admitting: Internal Medicine

## 2013-11-28 ENCOUNTER — Other Ambulatory Visit: Payer: Self-pay | Admitting: Family Medicine

## 2014-01-02 ENCOUNTER — Other Ambulatory Visit: Payer: Self-pay | Admitting: Family Medicine

## 2014-01-20 ENCOUNTER — Telehealth: Payer: Self-pay | Admitting: Medical

## 2014-01-21 NOTE — Telephone Encounter (Signed)
P.A. DEXILANT til 01/04/15 approved, left message for pt.  Faxed pharmacy

## 2014-02-07 ENCOUNTER — Other Ambulatory Visit: Payer: Self-pay | Admitting: Family Medicine

## 2014-02-19 ENCOUNTER — Other Ambulatory Visit: Payer: Self-pay | Admitting: Family Medicine

## 2014-03-21 ENCOUNTER — Telehealth: Payer: Self-pay | Admitting: Internal Medicine

## 2014-03-21 ENCOUNTER — Other Ambulatory Visit: Payer: Self-pay | Admitting: Family Medicine

## 2014-03-21 DIAGNOSIS — G40909 Epilepsy, unspecified, not intractable, without status epilepticus: Secondary | ICD-10-CM

## 2014-03-21 MED ORDER — DILANTIN 100 MG PO CAPS: ORAL_CAPSULE | ORAL | Status: DC

## 2014-03-21 NOTE — Telephone Encounter (Signed)
Pt needs a refill on her dilantin to [facility]. Pt has schedule an appt with you for a med check next week

## 2014-03-24 ENCOUNTER — Telehealth: Payer: Self-pay | Admitting: Internal Medicine

## 2014-03-24 DIAGNOSIS — G40909 Epilepsy, unspecified, not intractable, without status epilepticus: Secondary | ICD-10-CM

## 2014-03-24 MED ORDER — DILANTIN 100 MG PO CAPS: ORAL_CAPSULE | ORAL | Status: DC

## 2014-03-24 NOTE — Telephone Encounter (Signed)
Pt called and states pharmacy filled the generic of dilantin and not DILANTIN. i have resent it in that pt can only have DILANTIN

## 2014-03-31 ENCOUNTER — Encounter: Payer: Self-pay | Admitting: Family Medicine

## 2014-03-31 ENCOUNTER — Ambulatory Visit (INDEPENDENT_AMBULATORY_CARE_PROVIDER_SITE_OTHER): Payer: BC Managed Care – PPO | Admitting: Family Medicine

## 2014-03-31 VITALS — BP 122/80 | HR 64 | Wt 225.0 lb

## 2014-03-31 DIAGNOSIS — I10 Essential (primary) hypertension: Secondary | ICD-10-CM

## 2014-03-31 DIAGNOSIS — L4050 Arthropathic psoriasis, unspecified: Secondary | ICD-10-CM

## 2014-03-31 DIAGNOSIS — J301 Allergic rhinitis due to pollen: Secondary | ICD-10-CM

## 2014-03-31 DIAGNOSIS — E669 Obesity, unspecified: Secondary | ICD-10-CM

## 2014-03-31 DIAGNOSIS — G40909 Epilepsy, unspecified, not intractable, without status epilepticus: Secondary | ICD-10-CM

## 2014-03-31 DIAGNOSIS — K219 Gastro-esophageal reflux disease without esophagitis: Secondary | ICD-10-CM

## 2014-03-31 DIAGNOSIS — Z23 Encounter for immunization: Secondary | ICD-10-CM

## 2014-03-31 LAB — CBC WITH DIFFERENTIAL/PLATELET
BASOS ABS: 0 10*3/uL (ref 0.0–0.1)
BASOS PCT: 0 % (ref 0–1)
EOS ABS: 0.2 10*3/uL (ref 0.0–0.7)
Eosinophils Relative: 3 % (ref 0–5)
HCT: 33.1 % — ABNORMAL LOW (ref 36.0–46.0)
Hemoglobin: 11.2 g/dL — ABNORMAL LOW (ref 12.0–15.0)
Lymphocytes Relative: 51 % — ABNORMAL HIGH (ref 12–46)
Lymphs Abs: 2.6 10*3/uL (ref 0.7–4.0)
MCH: 31.8 pg (ref 26.0–34.0)
MCHC: 33.8 g/dL (ref 30.0–36.0)
MCV: 94 fL (ref 78.0–100.0)
Monocytes Absolute: 0.5 10*3/uL (ref 0.1–1.0)
Monocytes Relative: 9 % (ref 3–12)
NEUTROS ABS: 1.9 10*3/uL (ref 1.7–7.7)
NEUTROS PCT: 37 % — AB (ref 43–77)
PLATELETS: 223 10*3/uL (ref 150–400)
RBC: 3.52 MIL/uL — ABNORMAL LOW (ref 3.87–5.11)
RDW: 13.7 % (ref 11.5–15.5)
WBC: 5 10*3/uL (ref 4.0–10.5)

## 2014-03-31 MED ORDER — MOMETASONE FUROATE 50 MCG/ACT NA SUSP: 2.0000 | Freq: Every day | NASAL | Status: DC

## 2014-03-31 MED ORDER — DEXLANSOPRAZOLE 60 MG PO CPDR: DELAYED_RELEASE_CAPSULE | ORAL | Status: DC

## 2014-03-31 MED ORDER — DILANTIN 100 MG PO CAPS: ORAL_CAPSULE | ORAL | Status: DC

## 2014-03-31 MED ORDER — LOSARTAN POTASSIUM-HCTZ 50-12.5 MG PO TABS: ORAL_TABLET | ORAL | Status: DC

## 2014-03-31 NOTE — Progress Notes (Signed)
   Subjective:    Patient ID: Kimberly Butler, female    DOB: 06-09-1956, 58 y.o.   MRN: [id number]  HPI She is here for an interval evaluation. She does have an underlying seizure disorder and continues on Dilantin. She does have psoriatic arthritis and is being followed by rheumatology. She presently is on Enbrel as well as MTX. She does have some breakthrough arthritic symptoms on occasion. She continues on her Hyzaar for her hypertension. Her allergies seem to be under good control. She does have reflux disease and is doing well on her present medication regimen. She has no other concerns or complaints.   Review of Systems     Objective:   Physical Exam alert and in no distress. Tympanic membranes and canals are normal. Throat is clear. Tonsils are normal. Neck is supple without adenopathy or thyromegaly. Cardiac exam shows a regular sinus rhythm without murmurs or gallops. Lungs are clear to auscultation.        Assessment & Plan:  Seizure disorder - Plan: DILANTIN 100 MG ER capsule, CBC with Differential, Comprehensive metabolic panel, Phenytoin level, free  Need for prophylactic vaccination against Streptococcus pneumoniae (pneumococcus) - Plan: Pneumococcal polysaccharide vaccine 23-valent greater than or equal to 2yo subcutaneous/IM  Psoriatic arthritis  Essential hypertension - Plan: losartan-hydrochlorothiazide (HYZAAR) 50-12.5 MG per tablet, CBC with Differential, Comprehensive metabolic panel  Obesity (BMI 30-39.9) - Plan: CBC with Differential, Comprehensive metabolic panel, Lipid panel  Allergic rhinitis due to pollen - Plan: mometasone (NASONEX) 50 MCG/ACT nasal spray  Gastroesophageal reflux disease without esophagitis, Active  did recommend Tylenol for her arthritic pain and she has a breakthrough.

## 2014-04-01 LAB — COMPREHENSIVE METABOLIC PANEL
ALK PHOS: 82 U/L (ref 39–117)
ALT: 15 U/L (ref 0–35)
AST: 19 U/L (ref 0–37)
Albumin: 4 g/dL (ref 3.5–5.2)
BILIRUBIN TOTAL: 0.3 mg/dL (ref 0.2–1.2)
BUN: 16 mg/dL (ref 6–23)
CO2: 29 mEq/L (ref 19–32)
Calcium: 9 mg/dL (ref 8.4–10.5)
Chloride: 103 mEq/L (ref 96–112)
Creat: 0.64 mg/dL (ref 0.50–1.10)
Glucose, Bld: 97 mg/dL (ref 70–99)
Potassium: 3.6 mEq/L (ref 3.5–5.3)
SODIUM: 139 meq/L (ref 135–145)
TOTAL PROTEIN: 7 g/dL (ref 6.0–8.3)

## 2014-04-01 LAB — LIPID PANEL
Cholesterol: 183 mg/dL (ref 0–200)
HDL: 80 mg/dL (ref >39)
LDL CALC: 82 mg/dL (ref 0–99)
TRIGLYCERIDES: 105 mg/dL (ref <150)
Total CHOL/HDL Ratio: 2.3 Ratio
VLDL: 21 mg/dL (ref 0–40)

## 2014-04-03 LAB — PHENYTOIN LEVEL, FREE AND TOTAL
PHENYTOIN BOUND: 9.7 mg/L
PHENYTOIN, TOTAL: 10.3 mg/L (ref 10.0–20.0)
Phenytoin, Free: 0.6 mg/L — ABNORMAL LOW (ref 1.0–2.0)

## 2014-05-24 ENCOUNTER — Encounter: Payer: BC Managed Care – PPO | Admitting: Family Medicine

## 2014-06-21 ENCOUNTER — Ambulatory Visit (INDEPENDENT_AMBULATORY_CARE_PROVIDER_SITE_OTHER): Payer: BLUE CROSS/BLUE SHIELD | Admitting: Medical

## 2014-06-21 ENCOUNTER — Encounter: Payer: Self-pay | Admitting: Medical

## 2014-06-21 ENCOUNTER — Ambulatory Visit
Admission: RE | Admit: 2014-06-21 | Discharge: 2014-06-21 | Disposition: A | Discharge status: Home / Self Care | Payer: BLUE CROSS/BLUE SHIELD | Source: Ambulatory Visit | Attending: Medical | Admitting: Medical

## 2014-06-21 ENCOUNTER — Telehealth: Payer: Self-pay | Admitting: Medical

## 2014-06-21 VITALS — BP 102/70 | HR 56 | Temp 98.0°F | Resp 16 | Wt 215.0 lb

## 2014-06-21 DIAGNOSIS — Z79899 Other long term (current) drug therapy: Secondary | ICD-10-CM

## 2014-06-21 DIAGNOSIS — R05 Cough: Secondary | ICD-10-CM

## 2014-06-21 DIAGNOSIS — R059 Cough, unspecified: Secondary | ICD-10-CM

## 2014-06-21 DIAGNOSIS — J018 Other acute sinusitis: Secondary | ICD-10-CM

## 2014-06-21 DIAGNOSIS — R918 Other nonspecific abnormal finding of lung field: Secondary | ICD-10-CM

## 2014-06-21 MED ORDER — AMOXICILLIN-POT CLAVULANATE 875-125 MG PO TABS: 1.0000 | ORAL_TABLET | Freq: Two times a day (BID) | ORAL | Status: DC

## 2014-06-21 NOTE — Addendum Note (Signed)
Addended by: Carlena Hurl on: 06/21/2014 04:14 PM   Modules accepted: Orders

## 2014-06-21 NOTE — Telephone Encounter (Signed)
pls call patient . I haven't seen CXR results yet.  I did send Augmentin antibiotic to take BID, but she was suppose to go for CXR right before lunch today.

## 2014-06-21 NOTE — Progress Notes (Signed)
Subjective:  Kimberly Butler is a 59 y.o. female who presents for possible sinus infection.  Symptoms include 3 week of congestion, mostly sinus pressure, head congestion, and now moving into chest.  Slight headache all the time.  Had fever last week, but none now. No ear pain, no sore throat.  But ears are popping.   When symptoms started had swollen orbits, rash and bumps on right leg and forehead that is finally clearing up.  Having lots of thick nasal mucous.  Used some Coricidin HBP last week and after 24 hours got itching and blisters of lips so stopped Coricidin.   Didn't take Enbrel last Friday.   Saw Dr. Estanislado Pandy yesterday who gave her Zpak but told her to see [place] first before taking this.  She has been instructed to hold off on Methotrexate and Enbrel until she sees [place].    Denies sick contacts.  No hives, no hx/o cold sores.   No other aggravating or relieving factors.  No other c/o.  ROS as in subjective   Objective: Filed Vitals:   06/21/14 1213  BP: 102/70  Pulse: 56  Temp: 98 F (36.7 C)  Resp: 16    General appearance: Alert, WD/WN, no distress                             Skin: warm, upper lip with vesicle left upper lip 51mm diameter, crusting lesions of central and right upper lip from recent vesicles, forehead with brown rough patches from recent worse rash per patient                           Head: + maxillary sinus tenderness,                            Eyes: conjunctiva normal, corneas clear, PERRLA                            Ears: flat TMs, external ear canals normal                          Nose: septum midline, turbinates swollen, with erythema and clear discharge             Mouth/throat: MMM, tongue normal, mild pharyngeal erythema                           Neck: supple, no adenopathy, no thyromegaly, nontender                          Heart: RRR, normal S1, S2, no murmurs                         Lungs: right lower field decreased sounds, otherwise few rhonchi, no  wheezes, no rales      Assessment and Plan:  Encounter Diagnoses  Name Primary?  . Cough Yes  . High risk medication use   . Other acute sinusitis   . Abnormal lung field    Go for CXR.  Discussed symptomatic relief, nasal saline flush with distilled water, rest, good hydration.  Hold off on Enbrel and Methotrexate at this time.   We will call with results and  plan.

## 2014-06-21 NOTE — Telephone Encounter (Signed)
CXR results are in the system. Patient is aware of the medication that was sent to the pharmacy

## 2014-06-30 ENCOUNTER — Telehealth: Payer: Self-pay | Admitting: Internal Medicine

## 2014-06-30 MED ORDER — LEVOFLOXACIN 750 MG PO TABS: 750.0000 mg | ORAL_TABLET | Freq: Every day | ORAL | Status: DC

## 2014-06-30 NOTE — Telephone Encounter (Signed)
Pt called and states that she was running a fever of 100.2, having cold chills last night then woke up sweating this morning. She is weak, fatigue. Has a cough still at night and still having sinus issues. She was told to take augmentin and has helped alittle but still has 2 days left of the meds. She would like something else called [facility]. Pt was told to hold off on her methotrexate and enbrel.

## 2014-06-30 NOTE — Telephone Encounter (Signed)
She has had difficulty recently with fever and chills and is also noted a slight worsening of her breathing. I will switch her to Levaquin. Instructed her to go to the emergency room if her symptoms do not improve

## 2014-07-07 ENCOUNTER — Telehealth: Payer: Self-pay | Admitting: Family Medicine

## 2014-07-07 ENCOUNTER — Encounter: Payer: Self-pay | Admitting: Internal Medicine

## 2014-07-07 DIAGNOSIS — L409 Psoriasis, unspecified: Secondary | ICD-10-CM | POA: Insufficient documentation

## 2014-07-07 NOTE — Telephone Encounter (Signed)
Left word for word message  

## 2014-07-07 NOTE — Telephone Encounter (Signed)
Pt called and states she cannot afford her acid meds any more they are now charging her 90.00 for refill.  Please call her in something different.  Pt ph [phone]

## 2014-07-07 NOTE — Telephone Encounter (Signed)
She will need to check with her insurance to find out which meds are better covered of the same class

## 2014-07-12 ENCOUNTER — Telehealth: Payer: Self-pay | Admitting: Internal Medicine

## 2014-07-12 MED ORDER — PANTOPRAZOLE SODIUM 40 MG PO TBEC
40.0000 mg | DELAYED_RELEASE_TABLET | Freq: Every day | ORAL | Status: DC
Start: 2014-07-12 — End: 2015-06-23

## 2014-07-12 NOTE — Telephone Encounter (Signed)
Pt called stating that her delixant is not covered by her insurance, but will cover omeprazole, esomeprazole, pantoprazole.Marland Kitchen Please send a new rx to pt pharmacy at [facility] in [place] and please let pt know

## 2014-07-24 ENCOUNTER — Other Ambulatory Visit: Payer: Self-pay | Admitting: Family Medicine

## 2014-07-27 NOTE — Telephone Encounter (Signed)
dt ?

## 2014-07-29 ENCOUNTER — Encounter: Payer: Self-pay | Admitting: Family Medicine

## 2014-07-29 ENCOUNTER — Ambulatory Visit (INDEPENDENT_AMBULATORY_CARE_PROVIDER_SITE_OTHER): Payer: BLUE CROSS/BLUE SHIELD | Admitting: Family Medicine

## 2014-07-29 VITALS — BP 124/80 | HR 63 | Ht 66.0 in | Wt 216.0 lb

## 2014-07-29 DIAGNOSIS — Z79899 Other long term (current) drug therapy: Secondary | ICD-10-CM

## 2014-07-29 DIAGNOSIS — G40909 Epilepsy, unspecified, not intractable, without status epilepticus: Secondary | ICD-10-CM

## 2014-07-29 DIAGNOSIS — I10 Essential (primary) hypertension: Secondary | ICD-10-CM

## 2014-07-29 DIAGNOSIS — E669 Obesity, unspecified: Secondary | ICD-10-CM

## 2014-07-29 DIAGNOSIS — L4050 Arthropathic psoriasis, unspecified: Secondary | ICD-10-CM

## 2014-07-29 DIAGNOSIS — K219 Gastro-esophageal reflux disease without esophagitis: Secondary | ICD-10-CM

## 2014-07-29 DIAGNOSIS — J301 Allergic rhinitis due to pollen: Secondary | ICD-10-CM

## 2014-07-29 DIAGNOSIS — Z0000 Encounter for general adult medical examination without abnormal findings: Secondary | ICD-10-CM

## 2014-07-29 DIAGNOSIS — L409 Psoriasis, unspecified: Secondary | ICD-10-CM

## 2014-07-29 LAB — POCT URINALYSIS DIPSTICK
BILIRUBIN UA: NEGATIVE
Glucose, UA: NEGATIVE
KETONES UA: NEGATIVE
LEUKOCYTES UA: NEGATIVE
Nitrite, UA: NEGATIVE
PH UA: 6
RBC UA: NEGATIVE
Spec Grav, UA: >=1.03
Urobilinogen, UA: NEGATIVE

## 2014-07-29 LAB — CBC WITH DIFFERENTIAL/PLATELET
Basophils Absolute: 0 10*3/uL (ref 0.0–0.1)
Basophils Relative: 0 % (ref 0–1)
Eosinophils Absolute: 0.2 10*3/uL (ref 0.0–0.7)
Eosinophils Relative: 4 % (ref 0–5)
HEMATOCRIT: 35 % — AB (ref 36.0–46.0)
Hemoglobin: 11.8 g/dL — ABNORMAL LOW (ref 12.0–15.0)
LYMPHS PCT: 40 % (ref 12–46)
Lymphs Abs: 1.8 10*3/uL (ref 0.7–4.0)
MCH: 31.5 pg (ref 26.0–34.0)
MCHC: 33.7 g/dL (ref 30.0–36.0)
MCV: 93.3 fL (ref 78.0–100.0)
MONO ABS: 0.5 10*3/uL (ref 0.1–1.0)
MPV: 9.5 fL (ref 8.6–12.4)
Monocytes Relative: 10 % (ref 3–12)
NEUTROS ABS: 2.1 10*3/uL (ref 1.7–7.7)
NEUTROS PCT: 46 % (ref 43–77)
PLATELETS: 245 10*3/uL (ref 150–400)
RBC: 3.75 MIL/uL — AB (ref 3.87–5.11)
RDW: 14.1 % (ref 11.5–15.5)
WBC: 4.6 10*3/uL (ref 4.0–10.5)

## 2014-07-29 NOTE — Progress Notes (Signed)
   Subjective:    Patient ID: Kimberly Butler, female    DOB: Oct 13, 1955, 59 y.o.   MRN: [id number]  HPI She is here for a complete examination. She continues on her Dilantin and has had no difficulty with that. She does have underlying psoriasis and psoriatic arthritis. She is on Enbrel and is followed by Dr. Bennie Dallas. Her weight is stable. She is having no difficulty with her allergies and her reflux symptoms seem to be under fairly good control although she is mixing PPIs. Her work and home life are stable. Family and social history was reviewed. Health maintenance and immunizations were also renewed.   Review of Systems  All other systems reviewed and are negative.      Objective:   Physical Exam BP 124/80 mmHg  Pulse 63  Ht 5\' 6"  (1.676 m)  Wt 216 lb (97.977 kg)  BMI 34.88 kg/m2  General Appearance:    Alert, cooperative, no distress, appears stated age  Head:    Normocephalic, without obvious abnormality, atraumatic  Eyes:    PERRL, conjunctiva/corneas clear, EOM's intact, fundi    benign  Ears:    Normal TM's and external ear canals  Nose:   Nares normal, mucosa normal, no drainage or sinus   tenderness  Throat:   Lips, mucosa, and tongue normal; teeth and gums normal  Neck:   Supple,no lymphadenopathy;  thyroid:  no   enlargement/tenderness/nodules; no carotid   bruit or JVD  Back:    Spine nontender, no curvature, ROM normal, no CVA     tenderness  Lungs:     Clear to auscultation bilaterally without wheezes, rales or     ronchi; respirations unlabored  Chest Wall:    No tenderness or deformity   Heart:    Regular rate and rhythm, S1 and S2 normal, no murmur, rub   or gallop  Breast Exam:    Deferred to GYN  Abdomen:     Soft, non-tender, nondistended, normoactive bowel sounds,    no masses, no hepatosplenomegaly  Genitalia:    Deferred to GYN     Extremities:   No clubbing, cyanosis or edema  Pulses:   2+ and symmetric all extremities  Skin:   Skin color, texture,  turgor normal, no rashes or lesions  Lymph nodes:   Cervical, supraclavicular, and axillary nodes normal  Neurologic:   CNII-XII intact, normal strength, sensation and gait; reflexes 2+ and symmetric throughout          Psych:   Normal mood, affect, hygiene and grooming.         Assessment & Plan:  Routine general medical examination at a health care facility - Plan: POCT urinalysis dipstick  Seizure disorder - Plan: Phenytoin level, free  Psoriatic arthritis - Plan: Quantiferon tb gold assay  Obesity (BMI 30-39.9) - Plan: CBC with Differential/Platelet, Comprehensive metabolic panel  Psoriasis  Essential hypertension - Plan: CBC with Differential/Platelet, Comprehensive metabolic panel  Gastroesophageal reflux disease without esophagitis  Allergic rhinitis due to pollen  Encounter for long-term (current) use of other high-risk medications - Plan: Quantiferon tb gold assay  Continue on present medication regimen. Encouraged her to remain as active as she possibly can.

## 2014-07-30 LAB — COMPREHENSIVE METABOLIC PANEL
ALT: 17 U/L (ref 0–35)
AST: 19 U/L (ref 0–37)
Albumin: 4 g/dL (ref 3.5–5.2)
Alkaline Phosphatase: 82 U/L (ref 39–117)
BUN: 17 mg/dL (ref 6–23)
CHLORIDE: 105 meq/L (ref 96–112)
CO2: 29 mEq/L (ref 19–32)
CREATININE: 0.57 mg/dL (ref 0.50–1.10)
Calcium: 9.5 mg/dL (ref 8.4–10.5)
Glucose, Bld: 90 mg/dL (ref 70–99)
Potassium: 4.1 mEq/L (ref 3.5–5.3)
SODIUM: 141 meq/L (ref 135–145)
Total Bilirubin: 0.3 mg/dL (ref 0.2–1.2)
Total Protein: 7.2 g/dL (ref 6.0–8.3)

## 2014-08-03 LAB — PHENYTOIN LEVEL, FREE AND TOTAL
Phenytoin Bound: 5.4 mg/L
Phenytoin, Free: 0.7 mg/L — ABNORMAL LOW (ref 1.0–2.0)
Phenytoin, Total: 6.1 mg/L — ABNORMAL LOW (ref 10.0–20.0)

## 2014-11-03 LAB — HM MAMMOGRAPHY: HM Mammogram: NEGATIVE

## 2014-11-21 ENCOUNTER — Encounter: Payer: Self-pay | Admitting: Internal Medicine

## 2014-12-26 ENCOUNTER — Other Ambulatory Visit: Payer: Self-pay | Admitting: Family Medicine

## 2015-01-22 ENCOUNTER — Other Ambulatory Visit: Payer: Self-pay | Admitting: Family Medicine

## 2015-06-23 ENCOUNTER — Other Ambulatory Visit: Payer: Self-pay | Admitting: Family Medicine

## 2015-08-02 ENCOUNTER — Other Ambulatory Visit: Payer: Self-pay | Admitting: Family Medicine

## 2015-10-01 ENCOUNTER — Other Ambulatory Visit: Payer: Self-pay | Admitting: Family Medicine

## 2015-11-08 LAB — HM MAMMOGRAPHY

## 2015-11-09 ENCOUNTER — Encounter: Payer: Self-pay | Admitting: Family Medicine

## 2016-02-16 ENCOUNTER — Other Ambulatory Visit: Payer: Self-pay | Admitting: Family Medicine

## 2016-03-06 LAB — HM DEXA SCAN

## 2016-03-07 ENCOUNTER — Encounter: Payer: Self-pay | Admitting: Family Medicine

## 2016-03-15 ENCOUNTER — Other Ambulatory Visit: Payer: Self-pay | Admitting: Family Medicine

## 2016-03-18 ENCOUNTER — Telehealth: Payer: Self-pay | Admitting: Family Medicine

## 2016-03-18 NOTE — Telephone Encounter (Signed)
Appt for CPE made & Dilantin Rx was called in

## 2016-03-18 NOTE — Telephone Encounter (Signed)
Is this okay to refill? 

## 2016-03-29 ENCOUNTER — Ambulatory Visit (INDEPENDENT_AMBULATORY_CARE_PROVIDER_SITE_OTHER): Payer: BLUE CROSS/BLUE SHIELD | Admitting: Family Medicine

## 2016-03-29 ENCOUNTER — Other Ambulatory Visit (HOSPITAL_COMMUNITY)
Admission: RE | Admit: 2016-03-29 | Discharge: 2016-03-29 | Disposition: A | Discharge status: Home / Self Care | Payer: BLUE CROSS/BLUE SHIELD | Source: Ambulatory Visit | Attending: Family Medicine | Admitting: Family Medicine

## 2016-03-29 ENCOUNTER — Encounter: Payer: Self-pay | Admitting: Family Medicine

## 2016-03-29 VITALS — BP 110/70 | HR 50 | Ht 66.0 in | Wt 222.0 lb

## 2016-03-29 DIAGNOSIS — Z87891 Personal history of nicotine dependence: Secondary | ICD-10-CM

## 2016-03-29 DIAGNOSIS — Z1211 Encounter for screening for malignant neoplasm of colon: Secondary | ICD-10-CM | POA: Diagnosis not present

## 2016-03-29 DIAGNOSIS — G40909 Epilepsy, unspecified, not intractable, without status epilepticus: Secondary | ICD-10-CM | POA: Diagnosis not present

## 2016-03-29 DIAGNOSIS — L409 Psoriasis, unspecified: Secondary | ICD-10-CM

## 2016-03-29 DIAGNOSIS — E669 Obesity, unspecified: Secondary | ICD-10-CM | POA: Diagnosis not present

## 2016-03-29 DIAGNOSIS — J301 Allergic rhinitis due to pollen: Secondary | ICD-10-CM | POA: Diagnosis not present

## 2016-03-29 DIAGNOSIS — D638 Anemia in other chronic diseases classified elsewhere: Secondary | ICD-10-CM

## 2016-03-29 DIAGNOSIS — Z0000 Encounter for general adult medical examination without abnormal findings: Secondary | ICD-10-CM

## 2016-03-29 DIAGNOSIS — K219 Gastro-esophageal reflux disease without esophagitis: Secondary | ICD-10-CM

## 2016-03-29 DIAGNOSIS — Z01419 Encounter for gynecological examination (general) (routine) without abnormal findings: Secondary | ICD-10-CM | POA: Insufficient documentation

## 2016-03-29 DIAGNOSIS — Z23 Encounter for immunization: Secondary | ICD-10-CM

## 2016-03-29 DIAGNOSIS — Z124 Encounter for screening for malignant neoplasm of cervix: Secondary | ICD-10-CM | POA: Diagnosis not present

## 2016-03-29 DIAGNOSIS — I10 Essential (primary) hypertension: Secondary | ICD-10-CM | POA: Diagnosis not present

## 2016-03-29 DIAGNOSIS — L4050 Arthropathic psoriasis, unspecified: Secondary | ICD-10-CM

## 2016-03-29 DIAGNOSIS — Z79899 Other long term (current) drug therapy: Secondary | ICD-10-CM

## 2016-03-29 LAB — COMPREHENSIVE METABOLIC PANEL
ALBUMIN: 4.3 g/dL (ref 3.6–5.1)
ALT: 16 U/L (ref 6–29)
AST: 23 U/L (ref 10–35)
Alkaline Phosphatase: 87 U/L (ref 33–130)
BUN: 14 mg/dL (ref 7–25)
CHLORIDE: 104 mmol/L (ref 98–110)
CO2: 30 mmol/L (ref 20–31)
Calcium: 9.3 mg/dL (ref 8.6–10.4)
Creat: 0.7 mg/dL (ref 0.50–0.99)
Glucose, Bld: 85 mg/dL (ref 65–99)
POTASSIUM: 3.7 mmol/L (ref 3.5–5.3)
Sodium: 142 mmol/L (ref 135–146)
TOTAL PROTEIN: 7.5 g/dL (ref 6.1–8.1)
Total Bilirubin: 0.3 mg/dL (ref 0.2–1.2)

## 2016-03-29 LAB — POCT URINALYSIS DIPSTICK
Bilirubin, UA: NEGATIVE
Glucose, UA: NEGATIVE
KETONES UA: NEGATIVE
Leukocytes, UA: NEGATIVE
Nitrite, UA: NEGATIVE
PH UA: 6
PROTEIN UA: NEGATIVE
RBC UA: NEGATIVE
Urobilinogen, UA: NEGATIVE

## 2016-03-29 LAB — LIPID PANEL
CHOL/HDL RATIO: 2.1 ratio (ref <=5.0)
Cholesterol: 194 mg/dL (ref 125–200)
HDL: 94 mg/dL (ref >=46)
LDL CALC: 78 mg/dL (ref <130)
Triglycerides: 108 mg/dL (ref <150)
VLDL: 22 mg/dL (ref <30)

## 2016-03-29 MED ORDER — MOMETASONE FUROATE 0.1 % EX CREA: 1.0000 "application " | TOPICAL_CREAM | Freq: Every day | CUTANEOUS | 0 refills | Status: DC

## 2016-03-29 MED ORDER — DILANTIN 100 MG PO CAPS: ORAL_CAPSULE | ORAL | 3 refills | Status: DC

## 2016-03-29 MED ORDER — PANTOPRAZOLE SODIUM 40 MG PO TBEC: 40.0000 mg | DELAYED_RELEASE_TABLET | Freq: Every day | ORAL | 3 refills | Status: DC

## 2016-03-29 MED ORDER — LOSARTAN POTASSIUM-HCTZ 50-12.5 MG PO TABS: ORAL_TABLET | ORAL | 3 refills | Status: DC

## 2016-03-29 MED ORDER — DOCUSATE SODIUM 100 MG PO CAPS: 100.0000 mg | ORAL_CAPSULE | Freq: Two times a day (BID) | ORAL | 0 refills | Status: DC

## 2016-03-29 NOTE — Progress Notes (Signed)
Subjective:    Patient ID: Kimberly Butler, female    DOB: 1955/09/30, 60 y.o.   MRN: [id number]  HPI She is here for a complete examination. She is on DMARD medication from her rheumatologist. Blood work needs be drawn for that. She also has a history of seizure disorder and has been quite stable on Dilantin for several years. She continues on her blood pressure medication. She also has reflux disease and needs to take the Nexium on a regular basis. Her weight is down slightly. Allergies seem to be under good control. She does occasionally have psoriatic outbreaks especially in the vaginal area and does use a topical steroid for this. Review of record also indicates anemia probably from chronic illness. She continues to work. Her marriage seems to be fairly stable. Family and social history as well as health maintenance and immunizations were reviewed.   Review of Systems  All other systems reviewed and are negative.      Objective:   Physical Exam BP 110/70   Pulse (!) 50   Ht 5\' 6"  (1.676 m)   Wt 222 lb (100.7 kg)   BMI 35.83 kg/m   General Appearance:    Alert, cooperative, no distress, appears stated age  Head:    Normocephalic, without obvious abnormality, atraumatic  Eyes:    PERRL, conjunctiva/corneas clear, EOM's intact, fundi    benign  Ears:    Normal TM's and external ear canals  Nose:   Nares normal, mucosa normal, no drainage or sinus   tenderness  Throat:   Lips, mucosa, and tongue normal; teeth and gums normal  Neck:   Supple, no lymphadenopathy;  thyroid:  no   enlargement/tenderness/nodules; no carotid   bruit or JVD     Lungs:     Clear to auscultation bilaterally without wheezes, rales or     ronchi; respirations unlabored      Heart:    Regular rate and rhythm, S1 and S2 normal, no murmur, rub   or gallop  Breast Exam:  Deferred  Abdomen:     Soft, non-tender, nondistended, normoactive bowel sounds,    no masses, no hepatosplenomegaly  Genitalia:    Normal  external genitalia without lesions.  BUS and vagina normal; cervix without lesions, or cervical motion tenderness. No abnormal vaginal discharge.  Uterus and adnexa not enlarged, nontender, no masses.  Pap performed  Rectal:   Deferred  Extremities:   No clubbing, cyanosis or edema  Pulses:   2+ and symmetric all extremities  Skin:   Skin color, texture, turgor normal, no rashes or lesions  Lymph nodes:   Cervical, supraclavicular, and axillary nodes normal  Neurologic:   CNII-XII intact, normal strength, sensation and gait; reflexes 2+ and symmetric throughout          Psych:   Normal mood, affect, hygiene and grooming.         Assessment & Plan:  Routine general medical examination at a health care facility - Plan: CBC with Differential/Platelet, Comprehensive metabolic panel, Lipid panel, POCT Urinalysis Dipstick  Need for prophylactic vaccination and inoculation against influenza - Plan: Flu Vaccine QUAD 36+ mos PF IM (Fluarix & Fluzone Quad PF)  Psoriatic arthritis ([place]) - Plan: CBC with Differential/Platelet, Comprehensive metabolic panel  Seizure disorder ([place]) - Plan: Phenytoin level, free, DILANTIN 100 MG ER capsule  Obesity (BMI 30-39.9) - Plan: CBC with Differential/Platelet, Comprehensive metabolic panel, Lipid panel  Essential hypertension - Plan: CBC with Differential/Platelet, Comprehensive metabolic panel, losartan-hydrochlorothiazide (HYZAAR)  50-12.5 MG tablet  Gastroesophageal reflux disease without esophagitis  Chronic allergic rhinitis due to pollen, unspecified seasonality  Need for prophylactic vaccination with combined diphtheria-tetanus-pertussis (DTP) vaccine - Plan: Tdap vaccine greater than or equal to 7yo IM  Psoriasis - Plan: mometasone (ELOCON) 0.1 % cream  High risk medication use  Screening for colon cancer - Plan: Ambulatory referral to Gastroenterology  Former smoker, stopped smoking in distant past  Anemia of chronic disease - Plan: CBC with  Differential/Platelet, docusate sodium (COLACE) 100 MG capsule  Screening for cervical cancer - Plan: Cytology - PAP [place] Overall she is doing quite well but did recommend she increase her physical activity. Her immunizations were updated as indicated above. She will continue to be followed by her rheumatologist.

## 2016-03-30 LAB — CBC WITH DIFFERENTIAL/PLATELET
Basophils Absolute: 0 cells/uL (ref 0–200)
Basophils Relative: 0 %
Eosinophils Absolute: 156 cells/uL (ref 15–500)
Eosinophils Relative: 2 %
HCT: 35.9 % (ref 35.0–45.0)
HEMOGLOBIN: 11.9 g/dL (ref 11.7–15.5)
Lymphocytes Relative: 29 %
Lymphs Abs: 2262 cells/uL (ref 850–3900)
MCH: 31.4 pg (ref 27.0–33.0)
MCHC: 33.1 g/dL (ref 32.0–36.0)
MCV: 94.7 fL (ref 80.0–100.0)
MONOS PCT: 9 %
MPV: 8.9 fL (ref 7.5–12.5)
Monocytes Absolute: 702 cells/uL (ref 200–950)
Neutro Abs: 4680 cells/uL (ref 1500–7800)
Neutrophils Relative %: 60 %
PLATELETS: 235 10*3/uL (ref 140–400)
RBC: 3.79 MIL/uL — ABNORMAL LOW (ref 3.80–5.10)
RDW: 13.5 % (ref 11.0–15.0)
WBC: 7.8 10*3/uL (ref 4.0–10.5)

## 2016-03-30 LAB — PHENYTOIN LEVEL, TOTAL: PHENYTOIN LVL: 10.8 ug/mL (ref 10.0–20.0)

## 2016-04-01 LAB — CYTOLOGY - PAP
Adequacy: ABSENT
Diagnosis: NEGATIVE

## 2016-04-02 ENCOUNTER — Telehealth: Payer: Self-pay

## 2016-04-02 NOTE — Telephone Encounter (Signed)
Yes I have sent labs to [facility]  And just sent referral to Dr.Hung

## 2016-04-02 NOTE — Telephone Encounter (Signed)
Pt called to let us know that [facility] called her to schedule appt, but she is supposed be going to Dr. Ulyses Amor office. She called Dr. Ulyses Amor office and they do not have the referral.   Pt also questions if labs have been faxed to Dr. Ihor Gully?   Please call pt with update. Victorino December

## 2016-04-16 ENCOUNTER — Telehealth: Payer: Self-pay

## 2016-04-16 NOTE — Telephone Encounter (Signed)
Have her come in so I can evaluate these

## 2016-04-16 NOTE — Telephone Encounter (Signed)
Pt has appointment 11/9

## 2016-04-16 NOTE — Telephone Encounter (Signed)
Pt called the office with few concerns. 1-Pt reports there is a callus on her left foot. She reports pain in callus with walking. Does pt need to be seen here or at her dermatologist?  2-Pt states that she fell 1 week ago while hiking. She hit her right chest when she fell. She said that her chest wall has been hurting, and pain has improved overall. She reports pain gets worse when lying down. Denies SHOB or trouble breathing. Any other advice to offer?  3- Said she has not heard from Dr. Ulyses Amor office- provided phone number to pt to contact their office since referral faxed on 04/02/16. Victorino December

## 2016-04-18 ENCOUNTER — Encounter: Payer: Self-pay | Admitting: Family Medicine

## 2016-04-18 ENCOUNTER — Ambulatory Visit (INDEPENDENT_AMBULATORY_CARE_PROVIDER_SITE_OTHER): Payer: BLUE CROSS/BLUE SHIELD | Admitting: Family Medicine

## 2016-04-18 VITALS — BP 110/70 | HR 58 | Temp 97.9°F | Resp 18 | Wt 225.4 lb

## 2016-04-18 DIAGNOSIS — L4050 Arthropathic psoriasis, unspecified: Secondary | ICD-10-CM

## 2016-04-18 DIAGNOSIS — B9789 Other viral agents as the cause of diseases classified elsewhere: Secondary | ICD-10-CM | POA: Diagnosis not present

## 2016-04-18 DIAGNOSIS — R0781 Pleurodynia: Secondary | ICD-10-CM

## 2016-04-18 DIAGNOSIS — J069 Acute upper respiratory infection, unspecified: Secondary | ICD-10-CM

## 2016-04-18 DIAGNOSIS — B070 Plantar wart: Secondary | ICD-10-CM

## 2016-04-18 DIAGNOSIS — Z79899 Other long term (current) drug therapy: Secondary | ICD-10-CM | POA: Diagnosis not present

## 2016-04-18 NOTE — Progress Notes (Signed)
   Subjective:    Patient ID: Kimberly Butler, female    DOB: 09/16/1955, 60 y.o.   MRN: [id number]  HPI She is here for multiple issues. She has a 2 day history started with rhinorrhea followed by slight sore throat, nasal congestion and rhinorrhea. She is now having a slightly productive cough. Presently she is taking methotrexate for treatment of her psoriatic arthritis. Also 2 weeks ago while hiking she fell injuring her right ribs. She is still having some point tenderness in her right rib area and unable lay on that side but no shortness of breath or dyspnea on exertion. She also has a painful left lateral foot with point tenderness over the head of the fifth metatarsal.   Review of Systems     Objective:   Physical Exam Alert and in no distress. Tympanic membranes and canals are normal. Pharyngeal area is normal. Neck is supple without adenopathy or thyromegaly. Cardiac exam shows a regular sinus rhythm without murmurs or gallops. Lungs are clear to auscultation. Localized point tenderness is noted with compression of the chest. Slight tenderness to palpation over the lateral lower rib area. Small wart present over the plantar surface of the head of the fifth metatarsal on the left foot.        Assessment & Plan:  Plantar wart of left foot  Viral upper respiratory tract infection  Rib pain on right side  Encounter for long-term (current) use of high-risk medication  Psoriatic arthritis ([place]) I discussed the fact that she could easily of had a fractured rib but at this point conservative care is the main issue. Recommend she treat her viral URI with OTC medications. She will call if continued difficulty with that. The plantar wart was shaved down. She was instructed on proper use of Compound W on a weekly basis. I will have return here in 1 month's awaken shaved down further.

## 2016-04-19 ENCOUNTER — Encounter: Payer: Self-pay | Admitting: Family Medicine

## 2016-04-23 ENCOUNTER — Encounter: Payer: Self-pay | Admitting: Family Medicine

## 2016-04-24 ENCOUNTER — Encounter: Payer: Self-pay | Admitting: Family Medicine

## 2016-04-29 ENCOUNTER — Telehealth: Payer: Self-pay | Admitting: Radiology

## 2016-04-29 MED ORDER — ETANERCEPT 50 MG/ML ~~LOC~~ SOAJ: 50.0000 mg | SUBCUTANEOUS | 0 refills | Status: DC

## 2016-04-29 NOTE — Telephone Encounter (Signed)
02/22/16 last visit Next visit not sch message sent for appt scheduling 03/29/16 CBC CMP PCP normal  TB neg 12/31/15 Ok to refill per Mr Carlyon Shadow

## 2016-04-29 NOTE — Telephone Encounter (Signed)
Refill request received via fax for Enbrel. [facility] mail order

## 2016-05-07 ENCOUNTER — Telehealth: Payer: Self-pay | Admitting: Radiology

## 2016-05-07 NOTE — Telephone Encounter (Signed)
Refill request received via fax for MTX 0.2mL [facility]

## 2016-05-08 MED ORDER — METHOTREXATE SODIUM CHEMO INJECTION 50 MG/2ML: 17.5000 mg | INTRAMUSCULAR | 0 refills | Status: DC

## 2016-05-08 NOTE — Telephone Encounter (Signed)
02/22/16 last visit 03/29/16 labs WNL  Next visit 08/22/16 Ok to refill per Dr Estanislado Pandy

## 2016-05-13 ENCOUNTER — Telehealth: Payer: Self-pay | Admitting: Rheumatology

## 2016-05-13 NOTE — Telephone Encounter (Signed)
Kimberly Butler a Software engineer from [employer] called about a drug reaction. She says with the MTX and dilantin, the MTX decreases the patients dilantin levels. Cb# [phone]

## 2016-05-14 NOTE — Telephone Encounter (Signed)
Reviewed patient's chart.  Noted that patient has been on methotrexate since October 2013.  Patient was on Dilantin therapy prior to initiation of methotrexate.  Patient had phenytoin level on 03/29/16 that was 10.8.

## 2016-05-14 NOTE — Telephone Encounter (Signed)
Called patient and advised her that she needs to ensure that her phenytoin level is being monitored.  Patient voiced understanding and confirmed that her primary provider monitors her phenytoin level.  I also called [facility] regarding refill.  They will refill patient's methotrexate.

## 2016-05-14 NOTE — Telephone Encounter (Signed)
She should have Dilantin levels monitored by neurologist on regular basis

## 2016-05-14 NOTE — Telephone Encounter (Signed)
Patient called about MTX refill. Patient said [facility] will not refill until they get a call back  From the office since the patient is on dilantin.

## 2016-05-31 ENCOUNTER — Other Ambulatory Visit: Payer: Self-pay | Admitting: Rheumatology

## 2016-05-31 LAB — CBC WITH DIFFERENTIAL/PLATELET
BASOS ABS: 0 {cells}/uL (ref 0–200)
BASOS PCT: 0 %
EOS ABS: 122 {cells}/uL (ref 15–500)
Eosinophils Relative: 2 %
HCT: 33.8 % — ABNORMAL LOW (ref 35.0–45.0)
HEMOGLOBIN: 11.3 g/dL — AB (ref 11.7–15.5)
LYMPHS ABS: 2318 {cells}/uL (ref 850–3900)
Lymphocytes Relative: 38 %
MCH: 31.4 pg (ref 27.0–33.0)
MCHC: 33.4 g/dL (ref 32.0–36.0)
MCV: 93.9 fL (ref 80.0–100.0)
MONOS PCT: 8 %
MPV: 9 fL (ref 7.5–12.5)
Monocytes Absolute: 488 cells/uL (ref 200–950)
NEUTROS ABS: 3172 {cells}/uL (ref 1500–7800)
Neutrophils Relative %: 52 %
PLATELETS: 280 10*3/uL (ref 140–400)
RBC: 3.6 MIL/uL — ABNORMAL LOW (ref 3.80–5.10)
RDW: 14.1 % (ref 11.0–15.0)
WBC: 6.1 10*3/uL (ref 3.8–10.8)

## 2016-05-31 LAB — COMPLETE METABOLIC PANEL WITH GFR
ALT: 17 U/L (ref 6–29)
AST: 23 U/L (ref 10–35)
Albumin: 3.9 g/dL (ref 3.6–5.1)
Alkaline Phosphatase: 77 U/L (ref 33–130)
BUN: 12 mg/dL (ref 7–25)
CHLORIDE: 104 mmol/L (ref 98–110)
CO2: 30 mmol/L (ref 20–31)
Calcium: 9.5 mg/dL (ref 8.6–10.4)
Creat: 0.61 mg/dL (ref 0.50–0.99)
GLUCOSE: 82 mg/dL (ref 65–99)
POTASSIUM: 4.3 mmol/L (ref 3.5–5.3)
SODIUM: 142 mmol/L (ref 135–146)
Total Bilirubin: 0.3 mg/dL (ref 0.2–1.2)
Total Protein: 7.2 g/dL (ref 6.1–8.1)

## 2016-06-01 LAB — VITAMIN D 25 HYDROXY (VIT D DEFICIENCY, FRACTURES): VIT D 25 HYDROXY: 39 ng/mL (ref 30–100)

## 2016-06-09 NOTE — Progress Notes (Signed)
Labs stable

## 2016-06-11 ENCOUNTER — Telehealth: Payer: Self-pay | Admitting: Radiology

## 2016-06-11 NOTE — Telephone Encounter (Signed)
-----   Message from Bo Merino, MD sent at 06/09/2016  8:06 PM EST ----- Labs stable

## 2016-06-11 NOTE — Telephone Encounter (Signed)
Called patient to advise labs c/w previous

## 2016-07-31 ENCOUNTER — Other Ambulatory Visit (INDEPENDENT_AMBULATORY_CARE_PROVIDER_SITE_OTHER): Payer: Self-pay | Admitting: Rheumatology

## 2016-07-31 ENCOUNTER — Other Ambulatory Visit: Payer: Self-pay | Admitting: Rheumatology

## 2016-07-31 NOTE — Telephone Encounter (Signed)
Dr. D

## 2016-07-31 NOTE — Telephone Encounter (Signed)
Patient is requesting refills of enbrel and MTX be sent to [facility].

## 2016-07-31 NOTE — Telephone Encounter (Signed)
Tiffany from [facility] called needing refills on Embrel & Methotrexate for the patient. CB # [phone]

## 2016-08-01 MED ORDER — ETANERCEPT 50 MG/ML ~~LOC~~ SOAJ: 50.0000 mg | SUBCUTANEOUS | 0 refills | Status: DC

## 2016-08-01 MED ORDER — METHOTREXATE SODIUM CHEMO INJECTION 50 MG/2ML: 17.5000 mg | INTRAMUSCULAR | 0 refills | Status: DC

## 2016-08-01 NOTE — Telephone Encounter (Signed)
Last Visit: 02/22/16 Next Visit: 08/30/16 Labs: 05/31/16 Stable TB Gold: 12/31/15  Okay to refill Enbrel and MTX?

## 2016-08-01 NOTE — Telephone Encounter (Signed)
See previous phone note.  

## 2016-08-01 NOTE — Telephone Encounter (Signed)
ok 

## 2016-08-05 ENCOUNTER — Other Ambulatory Visit: Payer: Self-pay | Admitting: Radiology

## 2016-08-05 MED ORDER — ETANERCEPT 50 MG/ML ~~LOC~~ SOAJ: 50.0000 mg | SUBCUTANEOUS | 0 refills | Status: DC

## 2016-08-05 MED ORDER — METHOTREXATE SODIUM CHEMO INJECTION 50 MG/2ML: 17.5000 mg | INTRAMUSCULAR | 0 refills | Status: DC

## 2016-08-05 NOTE — Telephone Encounter (Signed)
Last Visit: 02/22/16 Next Visit: 08/30/16 Labs: 05/31/16 Stable TB Gold: 12/31/15 Neg    Okay to refill Enbrel and MTX?

## 2016-08-05 NOTE — Telephone Encounter (Signed)
Refill request received via fax for Enbrel and methotrexate [facility]/ [facility] [address]

## 2016-08-05 NOTE — Telephone Encounter (Signed)
ok 

## 2016-08-28 DIAGNOSIS — Z8639 Personal history of other endocrine, nutritional and metabolic disease: Secondary | ICD-10-CM | POA: Insufficient documentation

## 2016-08-28 DIAGNOSIS — M7062 Trochanteric bursitis, left hip: Secondary | ICD-10-CM | POA: Insufficient documentation

## 2016-08-28 DIAGNOSIS — Z79899 Other long term (current) drug therapy: Secondary | ICD-10-CM | POA: Insufficient documentation

## 2016-08-28 DIAGNOSIS — M533 Sacrococcygeal disorders, not elsewhere classified: Secondary | ICD-10-CM | POA: Insufficient documentation

## 2016-08-28 DIAGNOSIS — Z780 Asymptomatic menopausal state: Secondary | ICD-10-CM | POA: Insufficient documentation

## 2016-08-28 DIAGNOSIS — Z8719 Personal history of other diseases of the digestive system: Secondary | ICD-10-CM | POA: Insufficient documentation

## 2016-08-28 DIAGNOSIS — Z862 Personal history of diseases of the blood and blood-forming organs and certain disorders involving the immune mechanism: Secondary | ICD-10-CM | POA: Insufficient documentation

## 2016-08-28 NOTE — Progress Notes (Signed)
Office Visit Note  Patient: Kimberly Butler             Date of Birth: [birth year]           MRN: [id number]             PCP: Wyatt Haste, MD Referring: Denita Lung, MD Visit Date: 08/30/2016 Occupation: '@GUAROCC'$ @    Subjective:  Follow-up   History of Present Illness: Kimberly Butler is a 61 y.o. female  Last seen in our office on 02/22/2016.  Patient was doing well with the Enbrel and methotrexate and therefore Dr. Estanislado Pandy suggested that she might benefit from going to every 14 days of Enbrel.  Patient did that and feels that she is having more psoriasis as a result. Today in the office shows me one lesion on her right forearm. No other lesions are anywhere else. She's not having any joint pain swelling and stiffness.  Patient wants to do every 7 days of Enbrel. But she is happy to follow our advice. I suggested to the patient that we can do every 10 days of Enbrel. Patient is agreeable.   Patient also wants to do methotrexate at at 0.4 ML sensitive 0.6 mL's because her hair was falling out.  Otherwise she is doing well. She had her bone density done in September 2017 and had a T score at -0.3.   Activities of Daily Living:  Patient reports morning stiffness for 15 minutes.   Patient Denies nocturnal pain.  Difficulty dressing/grooming: Denies Difficulty climbing stairs: Denies Difficulty getting out of chair: Denies Difficulty using hands for taps, buttons, cutlery, and/or writing: Denies   Review of Systems  Constitutional: Negative for fatigue.  HENT: Negative for mouth sores and mouth dryness.   Eyes: Negative for dryness.  Respiratory: Negative for shortness of breath.   Gastrointestinal: Negative for constipation and diarrhea.  Musculoskeletal: Negative for myalgias and myalgias.  Skin: Negative for sensitivity to sunlight.  Psychiatric/Behavioral: Negative for decreased concentration and sleep disturbance.    PMFS History:  Patient Active Problem  List   Diagnosis Date Noted  . Trochanteric bursitis, left hip 08/28/2016  . Post-menopausal 08/28/2016  . High risk medication use 08/28/2016  . History of vitamin D deficiency 08/28/2016  . History of gastroesophageal reflux (GERD) 08/28/2016  . History of iron deficiency anemia 08/28/2016  . Primary osteoarthritis of both hands 08/28/2016  . Primary osteoarthritis of both knees 08/28/2016  . History of epilepsy 08/28/2016  . Sacro-iliac pain 08/28/2016  . Psoriasis 07/07/2014  . Seizure disorder ([place]) 01/07/2011  . GERD (gastroesophageal reflux disease) 01/07/2011  . Hypertension 01/07/2011  . Psoriatic arthritis ([place]) 01/07/2011  . Allergic rhinitis due to pollen 01/07/2011  . Obesity (BMI 30-39.9) 01/07/2011    Past Medical History:  Diagnosis Date  . Allergy    RHINITIS  . Cancer (HCC)    BASAL CELL  . Epilepsy ([place])   . GERD (gastroesophageal reflux disease)   . Obesity   . Psoriasis   . Psoriatic arthritis ([place])   . Smoker   . Varicose veins     Family History  Problem Relation Age of Onset  . Cancer Mother   . Arthritis Father   . Cancer Sister    Past Surgical History:  Procedure Laterality Date  . TONSILECTOMY, ADENOIDECTOMY, BILATERAL MYRINGOTOMY AND TUBES    . TUBAL LIGATION     Social History   Social History Narrative  . No narrative on file     Objective:  Vital Signs: BP 128/70   Pulse 62   Resp 14   Ht 5\' 7"  (1.702 m)   Wt 225 lb (102.1 kg)   BMI 35.24 kg/m    Physical Exam  Constitutional: She is oriented to person, place, and time. She appears well-developed and well-nourished.  HENT:  Head: Normocephalic and atraumatic.  Eyes: EOM are normal. Pupils are equal, round, and reactive to light.  Cardiovascular: Normal rate, regular rhythm and normal heart sounds.  Exam reveals no gallop and no friction rub.   No murmur heard. Pulmonary/Chest: Effort normal and breath sounds normal. She has no wheezes. She has no rales.  Abdominal:  Soft. Bowel sounds are normal. She exhibits no distension. There is no tenderness. There is no guarding. No hernia.  Musculoskeletal: Normal range of motion. She exhibits no edema, tenderness or deformity.  Lymphadenopathy:    She has no cervical adenopathy.  Neurological: She is alert and oriented to person, place, and time. Coordination normal.  Skin: Skin is warm and dry. Capillary refill takes less than 2 seconds. No rash noted.  Psychiatric: She has a normal mood and affect. Her behavior is normal.  Nursing note and vitals reviewed.    Musculoskeletal Exam:  Full range of motion of all joints Grip strength is equal and strong bilaterally For myalgia tender points are all absent  CDAI Exam: CDAI Homunculus Exam:   Joint Counts:  CDAI Tender Joint count: 0 CDAI Swollen Joint count: 0  Global Assessments:  Patient Global Assessment: 0 Provider Global Assessment: 0  No synovitis on examination. Patient does have osteoarthritis of bilateral hands with DIP PIP prominence bilaterally.  Investigation: Findings:  July 2017:  CBC showed hemoglobin of 11.5.  Comprehensive metabolic panel was normal.  TB Gold was negative  Bone Density to evaluate her bone mass  Patient's T score is -0.3 as of September 2017.  Orders Only on 05/31/2016  Component Date Value Ref Range Status  . Sodium 05/31/2016 142  135 - 146 mmol/L Final  . Potassium 05/31/2016 4.3  3.5 - 5.3 mmol/L Final  . Chloride 05/31/2016 104  98 - 110 mmol/L Final  . CO2 05/31/2016 30  20 - 31 mmol/L Final  . Glucose, Bld 05/31/2016 82  65 - 99 mg/dL Final  . BUN 06/02/2016 12  7 - 25 mg/dL Final  . Creat 88/88/3895 0.61  0.50 - 0.99 mg/dL Final   Comment:   For patients > or = 61 years of age: The upper reference limit for Creatinine is approximately 13% higher for people identified as African-American.     . Total Bilirubin 05/31/2016 0.3  0.2 - 1.2 mg/dL Final  . Alkaline Phosphatase 05/31/2016 77  33 - 130  U/L Final  . AST 05/31/2016 23  10 - 35 U/L Final  . ALT 05/31/2016 17  6 - 29 U/L Final  . Total Protein 05/31/2016 7.2  6.1 - 8.1 g/dL Final  . Albumin 06/02/2016 3.9  3.6 - 5.1 g/dL Final  . Calcium 23/91/9769 9.5  8.6 - 10.4 mg/dL Final  . GFR, Est African American 05/31/2016 >89  >=60 mL/min Final  . GFR, Est Non African American 05/31/2016 >89  >=60 mL/min Final  . WBC 05/31/2016 6.1  3.8 - 10.8 K/uL Final  . RBC 05/31/2016 3.60* 3.80 - 5.10 MIL/uL Final  . Hemoglobin 05/31/2016 11.3* 11.7 - 15.5 g/dL Final  . HCT 06/02/2016 33.8* 35.0 - 45.0 % Final  . MCV 05/31/2016 93.9  80.0 -  100.0 fL Final  . MCH 05/31/2016 31.4  27.0 - 33.0 pg Final  . MCHC 05/31/2016 33.4  32.0 - 36.0 g/dL Final  . RDW 05/31/2016 14.1  11.0 - 15.0 % Final  . Platelets 05/31/2016 280  140 - 400 K/uL Final  . MPV 05/31/2016 9.0  7.5 - 12.5 fL Final  . Neutro Abs 05/31/2016 3172  1,500 - 7,800 cells/uL Final  . Lymphs Abs 05/31/2016 2318  850 - 3,900 cells/uL Final  . Monocytes Absolute 05/31/2016 488  200 - 950 cells/uL Final  . Eosinophils Absolute 05/31/2016 122  15 - 500 cells/uL Final  . Basophils Absolute 05/31/2016 0  0 - 200 cells/uL Final  . Neutrophils Relative % 05/31/2016 52  % Final  . Lymphocytes Relative 05/31/2016 38  % Final  . Monocytes Relative 05/31/2016 8  % Final  . Eosinophils Relative 05/31/2016 2  % Final  . Basophils Relative 05/31/2016 0  % Final  . Smear Review 05/31/2016 Criteria for review not met   Final  . Vit D, 25-Hydroxy 05/31/2016 39  30 - 100 ng/mL Final   Comment: Vitamin D Status           25-OH Vitamin D        Deficiency                <20 ng/mL        Insufficiency         20 - 29 ng/mL        Optimal             > or = 30 ng/mL   For 25-OH Vitamin D testing on patients on D2-supplementation and patients for whom quantitation of D2 and D3 fractions is required, the QuestAssureD 25-OH VIT D, (D2,D3), LC/MS/MS is recommended: order code [id number] (patients > 2  yrs).   Abstract on 04/19/2016  Component Date Value Ref Range Status  . HM Colonoscopy 01/23/2009 See Report (in chart)  See Report (in chart), Patient Reported Final  Abstract on 04/03/2016  Component Date Value Ref Range Status  . HM Colonoscopy 01/23/2009 See Report (in chart)  See Report (in chart), Patient Reported Final  Office Visit on 03/29/2016  Component Date Value Ref Range Status  . WBC 03/29/2016 7.8  4.0 - 10.5 K/uL Final  . RBC 03/29/2016 3.79* 3.80 - 5.10 MIL/uL Final  . Hemoglobin 03/29/2016 11.9  11.7 - 15.5 g/dL Final  . HCT 03/29/2016 35.9  35.0 - 45.0 % Final  . MCV 03/29/2016 94.7  80.0 - 100.0 fL Final  . MCH 03/29/2016 31.4  27.0 - 33.0 pg Final  . MCHC 03/29/2016 33.1  32.0 - 36.0 g/dL Final  . RDW 03/29/2016 13.5  11.0 - 15.0 % Final  . Platelets 03/29/2016 235  140 - 400 K/uL Final  . MPV 03/29/2016 8.9  7.5 - 12.5 fL Final  . Neutro Abs 03/29/2016 4680  1,500 - 7,800 cells/uL Final  . Lymphs Abs 03/29/2016 2262  850 - 3,900 cells/uL Final  . Monocytes Absolute 03/29/2016 702  200 - 950 cells/uL Final  . Eosinophils Absolute 03/29/2016 156  15 - 500 cells/uL Final  . Basophils Absolute 03/29/2016 0  0 - 200 cells/uL Final  . Neutrophils Relative % 03/29/2016 60  % Final  . Lymphocytes Relative 03/29/2016 29  % Final  . Monocytes Relative 03/29/2016 9  % Final  . Eosinophils Relative 03/29/2016 2  % Final  . Basophils Relative 03/29/2016 0  %  Final  . Smear Review 03/29/2016 Criteria for review not met   Final  . Sodium 03/29/2016 142  135 - 146 mmol/L Final  . Potassium 03/29/2016 3.7  3.5 - 5.3 mmol/L Final  . Chloride 03/29/2016 104  98 - 110 mmol/L Final  . CO2 03/29/2016 30  20 - 31 mmol/L Final  . Glucose, Bld 03/29/2016 85  65 - 99 mg/dL Final  . BUN 03/29/2016 14  7 - 25 mg/dL Final  . Creat 03/29/2016 0.70  0.50 - 0.99 mg/dL Final   Comment:   For patients > or = 61 years of age: The upper reference limit for Creatinine is approximately 13%  higher for people identified as African-American.     . Total Bilirubin 03/29/2016 0.3  0.2 - 1.2 mg/dL Final  . Alkaline Phosphatase 03/29/2016 87  33 - 130 U/L Final  . AST 03/29/2016 23  10 - 35 U/L Final  . ALT 03/29/2016 16  6 - 29 U/L Final  . Total Protein 03/29/2016 7.5  6.1 - 8.1 g/dL Final  . Albumin 03/29/2016 4.3  3.6 - 5.1 g/dL Final  . Calcium 03/29/2016 9.3  8.6 - 10.4 mg/dL Final  . Cholesterol 03/29/2016 194  125 - 200 mg/dL Final  . Triglycerides 03/29/2016 108  <150 mg/dL Final  . HDL 03/29/2016 94  >=46 mg/dL Final  . Total CHOL/HDL Ratio 03/29/2016 2.1  <=5.0 Ratio Final  . VLDL 03/29/2016 22  <30 mg/dL Final  . LDL Cholesterol 03/29/2016 78  <130 mg/dL Final   Comment:   Total Cholesterol/HDL Ratio:CHD Risk                        Coronary Heart Disease Risk Table                                        Men       Women          1/2 Average Risk              3.4        3.3              Average Risk              5.0        4.4           2X Average Risk              9.6        7.1           3X Average Risk             23.4       11.0 Use the calculated Patient Ratio above and the CHD Risk table  to determine the patient's CHD Risk.   . Adequacy 03/29/2016 Satisfactory for evaluation  endocervical/transformation zone component ABSENT.   Final  . Diagnosis 03/29/2016 NEGATIVE FOR INTRAEPITHELIAL LESIONS OR MALIGNANCY.   Final  . Material Submitted 03/29/2016 CervicoVaginal Pap [ThinPrep Imaged]   Final  . Color, UA 03/29/2016 yellow   Final  . Clarity, UA 03/29/2016 clear   Final  . Glucose, UA 03/29/2016 n   Final  . Bilirubin, UA 03/29/2016 n   Final  . Ketones, UA 03/29/2016 n   Final  . Spec Grav, UA 03/29/2016 >=1.030  Final  . Blood, UA 03/29/2016 n   Final  . pH, UA 03/29/2016 6.0   Final  . Protein, UA 03/29/2016 n   Final  . Urobilinogen, UA 03/29/2016 negative   Final  . Nitrite, UA 03/29/2016 n   Final  . Leukocytes, UA 03/29/2016 Negative   Negative Final  . Phenytoin Lvl 03/29/2016 10.8  10.0 - 20.0 ug/mL Final  Abstract on 03/07/2016  Component Date Value Ref Range Status  . HM Dexa Scan 03/06/2016 wnl   Final      Imaging: No results found.  Speciality Comments: No specialty comments available.    Procedures:  No procedures performed Allergies: Patient has no known allergies.   Assessment / Plan:     Visit Diagnoses: Psoriasis  Psoriatic arthritis ([place])  High risk medication use - MTX-subcutaneouslyEnbrel-subcutaneously - Plan: CBC with Differential/Platelet, COMPLETE METABOLIC PANEL WITH GFR, CBC with Differential/Platelet, COMPLETE METABOLIC PANEL WITH GFR, Quantiferon tb gold assay (blood)  Trochanteric bursitis, left hip  Post-menopausal  Primary osteoarthritis of both hands  Primary osteoarthritis of both knees  History of vitamin D deficiency - Plan: VITAMIN D 25 Hydroxy (Vit-D Deficiency, Fractures)  History of gastroesophageal reflux (GERD)  History of anemia  History of epilepsy - Childhood  Sacro-iliac pain - Right SI Sclerosis  History of iron deficiency anemia   Plan: #1: Psoriatic arthritis is well controlled currently. Enbrel every 14 days is creating increased psoriasis for the patient. We suggested to every 10 days and patient is agreeable. No joint pain swelling or stiffness.  #2: High risk prescription Enbrel every other week is not working well for the patient. She'll do every 10 days Methotrexate 0.35M also suggested but she did 0.4 ML's for about 6 months due to increase hair loss. I've suggested to do 0.5 ML's every week and patient is agreeable.  #3: CBC with differential and CMP with GFR and vitamin D to be done in office today. She has a history of normal bone density in September 2017 and I want to monitor her vitamin D levels to ensure that she has proper amounts.  #4: CBC with differential CMP with GFR and TB goal will be due in 3 months.  #5: Return to clinic  in 5 months  #6: Note that she was using some kind of "cream" that she does not recall the name of and she was applying it to the vaginal area. She ended up getting a fungal infection. She saw her dermatologist who treated her for medication that she only takes 2 times. She has one more dose to take next week.  #7: OA of the hands. #8: OA of the knee joint. Doing well. No pain. No swelling stiffness or warmth.   Orders: Orders Placed This Encounter  Procedures  . CBC with Differential/Platelet  . COMPLETE METABOLIC PANEL WITH GFR  . VITAMIN D 25 Hydroxy (Vit-D Deficiency, Fractures)  . CBC with Differential/Platelet  . COMPLETE METABOLIC PANEL WITH GFR  . Quantiferon tb gold assay (blood)   No orders of the defined types were placed in this encounter.   Face-to-face time spent with patient was 30 minutes. 50% of time was spent in counseling and coordination of care.  Follow-Up Instructions: Return in about 5 months (around 01/30/2017) for PsA, Ps, Enbrel start Q 10 days, MTX 0.75m start now, .   NEliezer Lofts PA-C  Note - This record has been created using [employer]  Chart creation errors have been sought, but may not always  have been located. Such creation errors do not reflect on  the standard of medical care.

## 2016-08-30 ENCOUNTER — Encounter: Payer: Self-pay | Admitting: Rheumatology

## 2016-08-30 ENCOUNTER — Telehealth: Payer: Self-pay | Admitting: Pharmacist

## 2016-08-30 ENCOUNTER — Ambulatory Visit (INDEPENDENT_AMBULATORY_CARE_PROVIDER_SITE_OTHER): Payer: BLUE CROSS/BLUE SHIELD | Admitting: Rheumatology

## 2016-08-30 VITALS — BP 128/70 | HR 62 | Resp 14 | Ht 67.0 in | Wt 225.0 lb

## 2016-08-30 DIAGNOSIS — Z8639 Personal history of other endocrine, nutritional and metabolic disease: Secondary | ICD-10-CM

## 2016-08-30 DIAGNOSIS — M7062 Trochanteric bursitis, left hip: Secondary | ICD-10-CM | POA: Diagnosis not present

## 2016-08-30 DIAGNOSIS — L4050 Arthropathic psoriasis, unspecified: Secondary | ICD-10-CM | POA: Diagnosis not present

## 2016-08-30 DIAGNOSIS — Z862 Personal history of diseases of the blood and blood-forming organs and certain disorders involving the immune mechanism: Secondary | ICD-10-CM | POA: Diagnosis not present

## 2016-08-30 DIAGNOSIS — Z780 Asymptomatic menopausal state: Secondary | ICD-10-CM | POA: Diagnosis not present

## 2016-08-30 DIAGNOSIS — M533 Sacrococcygeal disorders, not elsewhere classified: Secondary | ICD-10-CM | POA: Diagnosis not present

## 2016-08-30 DIAGNOSIS — Z79899 Other long term (current) drug therapy: Secondary | ICD-10-CM | POA: Diagnosis not present

## 2016-08-30 DIAGNOSIS — Z8719 Personal history of other diseases of the digestive system: Secondary | ICD-10-CM | POA: Diagnosis not present

## 2016-08-30 DIAGNOSIS — L409 Psoriasis, unspecified: Secondary | ICD-10-CM | POA: Diagnosis not present

## 2016-08-30 DIAGNOSIS — Z8669 Personal history of other diseases of the nervous system and sense organs: Secondary | ICD-10-CM

## 2016-08-30 DIAGNOSIS — M19041 Primary osteoarthritis, right hand: Secondary | ICD-10-CM | POA: Diagnosis not present

## 2016-08-30 DIAGNOSIS — M19042 Primary osteoarthritis, left hand: Secondary | ICD-10-CM

## 2016-08-30 DIAGNOSIS — M170 Bilateral primary osteoarthritis of knee: Secondary | ICD-10-CM

## 2016-08-30 LAB — COMPLETE METABOLIC PANEL WITH GFR
ALK PHOS: 92 U/L (ref 33–130)
ALT: 16 U/L (ref 6–29)
AST: 18 U/L (ref 10–35)
Albumin: 4.1 g/dL (ref 3.6–5.1)
BILIRUBIN TOTAL: 0.2 mg/dL (ref 0.2–1.2)
BUN: 13 mg/dL (ref 7–25)
CALCIUM: 9.4 mg/dL (ref 8.6–10.4)
CO2: 28 mmol/L (ref 20–31)
CREATININE: 0.66 mg/dL (ref 0.50–0.99)
Chloride: 104 mmol/L (ref 98–110)
GFR, Est Non African American: >89 mL/min (ref >=60)
Glucose, Bld: 92 mg/dL (ref 65–99)
Potassium: 4.8 mmol/L (ref 3.5–5.3)
Sodium: 141 mmol/L (ref 135–146)
Total Protein: 7.1 g/dL (ref 6.1–8.1)

## 2016-08-30 LAB — CBC WITH DIFFERENTIAL/PLATELET
BASOS ABS: 0 {cells}/uL (ref 0–200)
BASOS PCT: 0 %
EOS ABS: 180 {cells}/uL (ref 15–500)
Eosinophils Relative: 3 %
HEMATOCRIT: 36.4 % (ref 35.0–45.0)
Hemoglobin: 12.1 g/dL (ref 11.7–15.5)
LYMPHS PCT: 41 %
Lymphs Abs: 2460 cells/uL (ref 850–3900)
MCH: 31.1 pg (ref 27.0–33.0)
MCHC: 33.2 g/dL (ref 32.0–36.0)
MCV: 93.6 fL (ref 80.0–100.0)
MONO ABS: 660 {cells}/uL (ref 200–950)
MPV: 10.1 fL (ref 7.5–12.5)
Monocytes Relative: 11 %
NEUTROS PCT: 45 %
Neutro Abs: 2700 cells/uL (ref 1500–7800)
Platelets: 244 10*3/uL (ref 140–400)
RBC: 3.89 MIL/uL (ref 3.80–5.10)
RDW: 14.3 % (ref 11.0–15.0)
WBC: 6 10*3/uL (ref 3.8–10.8)

## 2016-08-30 NOTE — Telephone Encounter (Signed)
Attempted to submit PA for Enbrel through patient's new insurance.  I was unable to submit PA as insurance does not go into effect until 09/08/16.  I informed patient of this.  Also informed her that our office is closed on Monday, 09/09/16, due to the Easter holiday, and will re-open 09/10/16.  We will submit PA as soon as we can and update her on status.  Patient confirms she has enough medication to last through the first week of April.   Elisabeth Most, Pharm.D., BCPS, CPP Clinical Pharmacist Pager: [phone] Phone: [phone] 08/30/2016 2:59 PM

## 2016-08-30 NOTE — Progress Notes (Signed)
Rheumatology Medication Review by a Pharmacist Does the patient feel that his/her medications are working for him/her?  Yes Has the patient been experiencing any side effects to the medications prescribed?  No Does the patient have any problems obtaining medications?  No  Issues to address at subsequent visits: Enbrel PA with new insurance   Pharmacist comments:  Kimberly Butler is a pleasant 61 yo F who presents for follow up of her rheumatoid arthritis.  She is currently taking Enbrel 50 mg every 14 days, methotrexate 0.4 mL weekly, and folic acid 1 mg daily.  Her most recent standing labs were on 05/31/16.  She is due for standing labs today.  Most recent TB Gold was negative on 12/29/15.  She will be due for TB Gold with her next labs.  Patient denies any questions or concerns regarding her medications at this time.  She does report she is getting a new insurance in April.  I made a copy of patient's new insurance card.  Will submit a PA for Enbrel through patient's new insurance.  Will update patient with status of PA once I know status.     Elisabeth Most, Pharm.D., BCPS, CPP Clinical Pharmacist Pager: [phone] Phone: [phone] 08/30/2016 10:03 AM

## 2016-08-31 LAB — VITAMIN D 25 HYDROXY (VIT D DEFICIENCY, FRACTURES): VIT D 25 HYDROXY: 32 ng/mL (ref 30–100)

## 2016-09-10 ENCOUNTER — Telehealth: Payer: Self-pay

## 2016-09-10 NOTE — Telephone Encounter (Signed)
Received a fax from [employer] regarding a prior authorization approval for Enbrel from 09/10/16 to 03/12/17.   Reference number:[id number] Phone number:[phone]  Will send document to scan center.  Left message for patient to call back.   Seymore Brodowski, [place], CPhT  2:51 PM

## 2016-09-10 NOTE — Telephone Encounter (Signed)
Patient returned call. I informed her of the results. Patient voiced understanding. She [facility] uses [facility]. She will check with her new  insurance [employer], clinical (histocompatibility and immunogenetics)) to see if she can still use them for her Enbrel. She will call us to let us know where to send it. She will be due for her next injection on Sunday, April 8th.  Kimberly Butler, [place], CPhT 4:14 PM

## 2016-09-11 ENCOUNTER — Other Ambulatory Visit: Payer: Self-pay | Admitting: Rheumatology

## 2016-09-11 MED ORDER — ETANERCEPT 50 MG/ML ~~LOC~~ SOAJ: 50.0000 mg | SUBCUTANEOUS | 0 refills | Status: DC

## 2016-09-11 MED ORDER — METHOTREXATE SODIUM CHEMO INJECTION 50 MG/2ML: 17.5000 mg | INTRAMUSCULAR | 0 refills | Status: DC

## 2016-09-11 NOTE — Telephone Encounter (Signed)
Last Visit:08/30/16 Next Visit due in August 2018 Message sent to the front to schedule patient.  Labs 08/30/16 WNL TB Gold: 12/30/16 Neg  Okay to refill MTX and Enbrel?

## 2016-09-11 NOTE — Telephone Encounter (Signed)
Patient is requesting refills of Enbrel and MTX be sent to [facility] (this is the new pharmacy she will be using due to insurance changes).

## 2016-09-12 ENCOUNTER — Telehealth: Payer: Self-pay | Admitting: Rheumatology

## 2016-09-12 NOTE — Telephone Encounter (Signed)
-----   Message from Carole Binning, LPN sent at 07/19/6379  9:40 AM EDT ----- Regarding: Please schedule follow up appointment  Please schedule patient follow up appointment. Patient is due August 2018. Thanks!

## 2016-09-12 NOTE — Telephone Encounter (Signed)
Patient is schedule for 01/13/17 @ 9:30

## 2016-09-13 ENCOUNTER — Telehealth: Payer: Self-pay | Admitting: Rheumatology

## 2016-09-13 NOTE — Telephone Encounter (Signed)
Kimberly Butler from [employer] needs clarification on the patient's MTX and Enbrel.  CB#[phone].  Thank you.

## 2016-09-13 NOTE — Telephone Encounter (Addendum)
[employer] specialty was in need of an ICD 10 code and provided ICD 10 code.

## 2016-11-18 ENCOUNTER — Ambulatory Visit (INDEPENDENT_AMBULATORY_CARE_PROVIDER_SITE_OTHER): Payer: Managed Care, Other (non HMO) | Admitting: Family Medicine

## 2016-11-18 ENCOUNTER — Ambulatory Visit: Payer: BLUE CROSS/BLUE SHIELD | Admitting: Family Medicine

## 2016-11-18 ENCOUNTER — Encounter: Payer: Self-pay | Admitting: Family Medicine

## 2016-11-18 VITALS — BP 130/80 | HR 76 | Wt 223.0 lb

## 2016-11-18 DIAGNOSIS — E739 Lactose intolerance, unspecified: Secondary | ICD-10-CM

## 2016-11-18 DIAGNOSIS — R109 Unspecified abdominal pain: Secondary | ICD-10-CM

## 2016-11-18 LAB — HM MAMMOGRAPHY

## 2016-11-18 NOTE — Patient Instructions (Signed)
Pay attention to see if any particular foods or food groups cause trouble. Look for milk and milk products and some the savings and you might want to consider trying Lactaid

## 2016-11-18 NOTE — Progress Notes (Signed)
   Subjective:    Patient ID: Kimberly Butler, female    DOB: 12/24/1955, 61 y.o.   MRN: [id number]  HPI She complains of a two-week history of abdominal bloating and occasional indigestion. She cannot relate this to any particular foods and has cut down on her eating because of this. She does however note that does cause some bloating and she has been avoiding this for a long period of time. She's had no diarrhea, fever, chills or vomiting.   Review of Systems     Objective:   Physical Exam Alert and in no distress. Tympanic membranes and canals are normal. Pharyngeal area is normal. Neck is supple without adenopathy or thyromegaly. Cardiac exam shows a regular sinus rhythm without murmurs or gallops. Lungs are clear to auscultation. Abdominal exam shows active bowel sounds. Positive Murphy's sign but negative Murphy's punch.       Assessment & Plan:  Abdominal pain, unspecified abdominal location - Plan: US Abdomen Limited RUQ  Lactose intolerance in adult Recommend she keep track of her eating habits to see if there is any correlation between her symptoms and particular foods including milk, milk products or greasy foods. Did mention the possibility of using Lactaid to help if she wants to eat anything with no milk products.

## 2016-11-22 ENCOUNTER — Encounter: Payer: Self-pay | Admitting: Family Medicine

## 2016-12-09 ENCOUNTER — Telehealth: Payer: Self-pay

## 2016-12-09 ENCOUNTER — Ambulatory Visit
Admission: RE | Admit: 2016-12-09 | Discharge: 2016-12-09 | Disposition: A | Discharge status: Home / Self Care | Payer: Managed Care, Other (non HMO) | Source: Ambulatory Visit | Attending: Family Medicine | Admitting: Family Medicine

## 2016-12-09 DIAGNOSIS — R109 Unspecified abdominal pain: Secondary | ICD-10-CM

## 2016-12-09 NOTE — Telephone Encounter (Signed)
Pt informed and I have faxed referral to [facility]

## 2016-12-09 NOTE — Telephone Encounter (Signed)
Call report from [facility]- ABD US shows concerns of acute cholecystitis. Pt last seen 11/18/2016 by you.

## 2016-12-09 NOTE — Telephone Encounter (Signed)
Let her know she is having gallbladder problems and refer to general surgery

## 2017-01-01 ENCOUNTER — Ambulatory Visit: Payer: Self-pay | Admitting: General Surgery

## 2017-01-07 NOTE — Progress Notes (Signed)
Office Visit Note  Patient: Kimberly Butler             Date of Birth: 1955-12-12           MRN: [id number]             PCP: Denita Lung, MD Referring: Denita Lung, MD Visit Date: 01/13/2017 Occupation: @GUAROCC @    Subjective:  Medication management   History of Present Illness: Kimberly Butler is a 61 y.o. female with history of psoriatic arthritis and psoriasis. She states she's been doing quite well on current combination of medications. She is scheduled to have cholecystectomy on August 16. She's not having much discomfort from osteoarthritis as well. Her SI joint pain has improved. Her left trochanteric bursitis is better as well.  Activities of Daily Living:  Patient reports morning stiffness for 0 minute.   Patient Denies nocturnal pain.  Difficulty dressing/grooming: Denies Difficulty climbing stairs: Reports Difficulty getting out of chair: Denies Difficulty using hands for taps, buttons, cutlery, and/or writing: Denies   Review of Systems  Constitutional: Negative for fatigue, night sweats, weight gain, weight loss and weakness.  HENT: Negative for mouth sores, trouble swallowing, trouble swallowing, mouth dryness and nose dryness.   Eyes: Negative for pain, redness, visual disturbance and dryness.  Respiratory: Negative for cough, shortness of breath and difficulty breathing.   Cardiovascular: Negative for chest pain, palpitations, hypertension, irregular heartbeat and swelling in legs/feet.  Gastrointestinal: Positive for constipation. Negative for blood in stool and diarrhea.  Endocrine: Negative for increased urination.  Genitourinary: Negative for vaginal dryness.  Musculoskeletal: Negative for arthralgias, joint pain, joint swelling, myalgias, muscle weakness, morning stiffness, muscle tenderness and myalgias.  Skin: Negative for color change, rash, hair loss, skin tightness, ulcers and sensitivity to sunlight.  Allergic/Immunologic: Negative for  susceptible to infections.  Neurological: Negative for dizziness, memory loss and night sweats.  Hematological: Negative for swollen glands.  Psychiatric/Behavioral: Negative for depressed mood and sleep disturbance. The patient is not nervous/anxious.     PMFS History:  Patient Active Problem List   Diagnosis Date Noted  . Lactose intolerance in adult 11/18/2016  . Trochanteric bursitis, left hip 08/28/2016  . Post-menopausal 08/28/2016  . High risk medication use 08/28/2016  . History of vitamin D deficiency 08/28/2016  . History of gastroesophageal reflux (GERD) 08/28/2016  . History of iron deficiency anemia 08/28/2016  . Primary osteoarthritis of both hands 08/28/2016  . Primary osteoarthritis of both knees 08/28/2016  . History of epilepsy 08/28/2016  . Sacro-iliac pain 08/28/2016  . Psoriasis 07/07/2014  . Seizure disorder ([place]) 01/07/2011  . GERD (gastroesophageal reflux disease) 01/07/2011  . Hypertension 01/07/2011  . Psoriatic arthritis ([place]) 01/07/2011  . Allergic rhinitis due to pollen 01/07/2011  . Obesity (BMI 30-39.9) 01/07/2011    Past Medical History:  Diagnosis Date  . Allergy    RHINITIS  . Cancer (HCC)    BASAL CELL  . Epilepsy ([place])   . GERD (gastroesophageal reflux disease)   . Obesity   . Psoriasis   . Psoriatic arthritis ([place])   . Smoker   . Varicose veins     Family History  Problem Relation Age of Onset  . Cancer Mother   . Heart disease Mother   . Arthritis Father   . Kidney failure Father   . Dementia Father   . Diabetes Father   . Cancer Sister   . Cancer Brother    Past Surgical History:  Procedure Laterality Date  .  CESAREAN SECTION    . TONSILECTOMY, ADENOIDECTOMY, BILATERAL MYRINGOTOMY AND TUBES    . TUBAL LIGATION     Social History   Social History Narrative  . No narrative on file     Objective: Vital Signs: BP 117/74 (BP Location: Left Arm, Patient Position: Sitting, Cuff Size: Normal)   Pulse 60   Ht 5\' 7"   (1.702 m)   Wt 231 lb (104.8 kg)   BMI 36.18 kg/m    Physical Exam  Constitutional: She is oriented to person, place, and time. She appears well-developed and well-nourished.  HENT:  Head: Normocephalic and atraumatic.  Eyes: Conjunctivae and EOM are normal.  Neck: Normal range of motion.  Cardiovascular: Normal rate, regular rhythm, normal heart sounds and intact distal pulses.   Pulmonary/Chest: Effort normal and breath sounds normal.  Abdominal: Soft. Bowel sounds are normal.  Lymphadenopathy:    She has no cervical adenopathy.  Neurological: She is alert and oriented to person, place, and time.  Skin: Skin is warm and dry. Capillary refill takes less than 2 seconds.  Psychiatric: She has a normal mood and affect. Her behavior is normal.  Nursing note and vitals reviewed.    Musculoskeletal Exam: C-spine and thoracic lumbar spine good range of motion. Shoulder joints elbow joints wrist joint MCPs PIPs DIPs with good range of motion. She has some thickening of PIP/DIP joint due to osteoarthritis but no synovitis was noted. Hip joints knee joints ankles were good range of motion. She is some edema on her right lower extremity. Patient relates this to having varicose veins. There was no warmth or swelling in MTPs or PIP joints. Her SI joint discomfort has resolved. She has mild tenderness over left trochanteric area.  CDAI Exam: CDAI Homunculus Exam:   Joint Counts:  CDAI Tender Joint count: 0 CDAI Swollen Joint count: 0  Global Assessments:  Patient Global Assessment: 1 Provider Global Assessment: 1  CDAI Calculated Score: 2    Investigation: Findings:  12/31/15 TB gold negative   CBC Latest Ref Rng & Units 08/30/2016 05/31/2016 03/29/2016  WBC 3.8 - 10.8 K/uL 6.0 6.1 7.8  Hemoglobin 11.7 - 15.5 g/dL 12.1 11.3(L) 11.9  Hematocrit 35.0 - 45.0 % 36.4 33.8(L) 35.9  Platelets 140 - 400 K/uL 244 280 235   CMP Latest Ref Rng & Units 08/30/2016 05/31/2016 03/29/2016  Glucose  65 - 99 mg/dL 92 82 85  BUN 7 - 25 mg/dL 13 12 14   Creatinine 0.50 - 0.99 mg/dL 0.66 0.61 0.70  Sodium 135 - 146 mmol/L 141 142 142  Potassium 3.5 - 5.3 mmol/L 4.8 4.3 3.7  Chloride 98 - 110 mmol/L 104 104 104  CO2 20 - 31 mmol/L 28 30 30   Calcium 8.6 - 10.4 mg/dL 9.4 9.5 9.3  Total Protein 6.1 - 8.1 g/dL 7.1 7.2 7.5  Total Bilirubin 0.2 - 1.2 mg/dL 0.2 0.3 0.3  Alkaline Phos 33 - 130 U/L 92 77 87  AST 10 - 35 U/L 18 23 23   ALT 6 - 29 U/L 16 17 16     Imaging: No results found.  Speciality Comments: No specialty comments available.    Procedures:  No procedures performed Allergies: Patient has no known allergies.   Assessment / Plan:     Visit Diagnoses: Psoriatic arthritis (HCC):patient has no active synovitis on examination she is clinically doing well.  Psoriasis: No active psoriasis lesions were noted.  High risk medication use - Methotrexate 0.7 mL subcutaneous every week, folic acid 2 mg  by mouth daily and Enbrel 50 mg subcutaneous every week - Plan: CBC with Differential/Platelet, COMPLETE METABOLIC PANEL WITH GFR, Quantiferon tb gold assay (blood), COMPLETE METABOLIC PANEL WITH GFR, CBC with Differential/Platelet. She will get labs today and then every 3 months to monitor for drug toxicity.  Primary osteoarthritis of both hands: Joint protection and muscle strengthening discussed.  Primary osteoarthritis of both knees: Weight loss diet and exercise discussed.  Trochanteric bursitis, left hip: Symptoms have improved. She will continue to do stretching exercises.  Patient is a scheduled for cholecystectomy on August 16. I had detailed discussion with the patient to stop her medication now and then she will resume 1 week after the surgery if okayed by her surgeon.  History of vitamin D deficiency: On supplement  History of gastroesophageal reflux (GERD)  History of iron deficiency anemia  History of epilepsy  Obesity (BMI 35.0-39.9 without comorbidity) : Weight  loss diet and exercise was discussed at length.  Association of heart disease with psoriatic arthritis was discussed. Need to monitor blood pressure, cholesterol, and to exercise 30-60 minutes on daily basis was discussed.   Orders: Orders Placed This Encounter  Procedures  . CBC with Differential/Platelet  . COMPLETE METABOLIC PANEL WITH GFR  . Quantiferon tb gold assay (blood)  . COMPLETE METABOLIC PANEL WITH GFR  . CBC with Differential/Platelet   No orders of the defined types were placed in this encounter.   Face-to-face time spent with patient was 30 minutes. Greater than 50% of time was spent in counseling and coordination of care.  Follow-Up Instructions: Return in about 5 months (around 06/15/2017) for Psoriatic arthritis, Osteoarthritis.   Bo Merino, MD  Note - This record has been created using Editor, commissioning.  Chart creation errors have been sought, but may not always  have been located. Such creation errors do not reflect on  the standard of medical care.

## 2017-01-13 ENCOUNTER — Ambulatory Visit (INDEPENDENT_AMBULATORY_CARE_PROVIDER_SITE_OTHER): Payer: Managed Care, Other (non HMO) | Admitting: Rheumatology

## 2017-01-13 ENCOUNTER — Encounter: Payer: Self-pay | Admitting: Rheumatology

## 2017-01-13 VITALS — BP 117/74 | HR 60 | Ht 67.0 in | Wt 231.0 lb

## 2017-01-13 DIAGNOSIS — Z8669 Personal history of other diseases of the nervous system and sense organs: Secondary | ICD-10-CM

## 2017-01-13 DIAGNOSIS — Z8639 Personal history of other endocrine, nutritional and metabolic disease: Secondary | ICD-10-CM

## 2017-01-13 DIAGNOSIS — Z79899 Other long term (current) drug therapy: Secondary | ICD-10-CM | POA: Diagnosis not present

## 2017-01-13 DIAGNOSIS — L409 Psoriasis, unspecified: Secondary | ICD-10-CM | POA: Diagnosis not present

## 2017-01-13 DIAGNOSIS — E669 Obesity, unspecified: Secondary | ICD-10-CM

## 2017-01-13 DIAGNOSIS — M7062 Trochanteric bursitis, left hip: Secondary | ICD-10-CM | POA: Diagnosis not present

## 2017-01-13 DIAGNOSIS — M19042 Primary osteoarthritis, left hand: Secondary | ICD-10-CM

## 2017-01-13 DIAGNOSIS — Z862 Personal history of diseases of the blood and blood-forming organs and certain disorders involving the immune mechanism: Secondary | ICD-10-CM

## 2017-01-13 DIAGNOSIS — M19041 Primary osteoarthritis, right hand: Secondary | ICD-10-CM | POA: Diagnosis not present

## 2017-01-13 DIAGNOSIS — Z8719 Personal history of other diseases of the digestive system: Secondary | ICD-10-CM

## 2017-01-13 DIAGNOSIS — L4050 Arthropathic psoriasis, unspecified: Secondary | ICD-10-CM

## 2017-01-13 DIAGNOSIS — M170 Bilateral primary osteoarthritis of knee: Secondary | ICD-10-CM | POA: Diagnosis not present

## 2017-01-13 LAB — CBC WITH DIFFERENTIAL/PLATELET
BASOS PCT: 0 %
Basophils Absolute: 0 cells/uL (ref 0–200)
EOS PCT: 3 %
Eosinophils Absolute: 243 cells/uL (ref 15–500)
HEMATOCRIT: 37.2 % (ref 35.0–45.0)
Hemoglobin: 12.5 g/dL (ref 11.7–15.5)
LYMPHS ABS: 2511 {cells}/uL (ref 850–3900)
LYMPHS PCT: 31 %
MCH: 32.3 pg (ref 27.0–33.0)
MCHC: 33.6 g/dL (ref 32.0–36.0)
MCV: 96.1 fL (ref 80.0–100.0)
MONO ABS: 648 {cells}/uL (ref 200–950)
MPV: 9.8 fL (ref 7.5–12.5)
Monocytes Relative: 8 %
Neutro Abs: 4698 cells/uL (ref 1500–7800)
Neutrophils Relative %: 58 %
PLATELETS: 250 10*3/uL (ref 140–400)
RBC: 3.87 MIL/uL (ref 3.80–5.10)
RDW: 14.1 % (ref 11.0–15.0)
WBC: 8.1 10*3/uL (ref 3.8–10.8)

## 2017-01-13 NOTE — Patient Instructions (Addendum)
Standing Labs We placed an order today for your standing lab work.    Please come back and get your standing labs in November and every 3 months  We have open lab Monday through Friday from 8:30-11:30 AM and 1:30-4 PM at the office of Dr. Bo Merino.   The office is located at [address] [place], [place], [place] [address] No appointment is necessary.   Labs are drawn by [facility].  You may receive a bill from [facility] for your lab work. If you have any questions regarding directions or hours of operation,  please call [phone].     Association of heart disease with psoriatic arthritis was discussed. Need to monitor blood pressure, cholesterol, and to exercise 30-60 minutes on daily basis was discussed.

## 2017-01-14 ENCOUNTER — Other Ambulatory Visit (HOSPITAL_COMMUNITY): Payer: Self-pay

## 2017-01-14 LAB — COMPLETE METABOLIC PANEL WITH GFR
ALT: 16 U/L (ref 6–29)
AST: 19 U/L (ref 10–35)
Albumin: 4.1 g/dL (ref 3.6–5.1)
Alkaline Phosphatase: 79 U/L (ref 33–130)
BUN: 18 mg/dL (ref 7–25)
CALCIUM: 9.1 mg/dL (ref 8.6–10.4)
CHLORIDE: 103 mmol/L (ref 98–110)
CO2: 26 mmol/L (ref 20–32)
CREATININE: 0.55 mg/dL (ref 0.50–0.99)
GFR, Est African American: >89 mL/min (ref >=60)
GFR, Est Non African American: >89 mL/min (ref >=60)
GLUCOSE: 75 mg/dL (ref 65–99)
POTASSIUM: 4.3 mmol/L (ref 3.5–5.3)
SODIUM: 140 mmol/L (ref 135–146)
Total Bilirubin: 0.3 mg/dL (ref 0.2–1.2)
Total Protein: 6.9 g/dL (ref 6.1–8.1)

## 2017-01-14 NOTE — Pre-Procedure Instructions (Signed)
Kimberly Butler  01/14/2017      [id number] - [place], [place] - [address] [place] [address] Phone: [phone] Fax: [phone]  [facility] [address] [place] - [address] [place] [address] Phone: [phone] Fax: [phone]  [place], [place] [address] [place] [address] Phone: [phone] Fax: [place], [place] [address] Phone: [phone] Fax: [phone]    Your procedure is scheduled on January 23, 2017  Report to [facility] Admitting Entrance "A" at 8:30 A.M.  Call this number if you have problems the morning of surgery:  [phone]   Remember:  Do not eat food or drink liquids after midnight on January 22, 2017  Take these medicines the morning of surgery with A SIP OF WATER: DILANTIN and Pantoprazole (Pe Ell).  7 days before surgery stop taking all Aspirins, Vitamins, Fish oils, and Herbal medications. Also stop all NSAIDS i.e. Advil, Motrin, Aleve, Anaprox, Naproxen, BC and Goody Powders.   Do not wear jewelry, make-up or nail polish.  Do not wear lotions, powders, or perfumes, or deoderant.  Do not shave 48 hours prior to surgery.   Do not bring valuables to the hospital.  [facility] is not responsible for any belongings or valuables.  Contacts, dentures or bridgework may not be worn into surgery.  Leave your suitcase in the car.  After surgery it may be brought to your room.  For patients admitted to the hospital, discharge time will be determined by your treatment team.  Patients discharged the day of surgery will not be allowed to drive home.   Special instructions:   [facility]- Preparing For Surgery  Before surgery, you can play an important role. Because skin is not sterile, your skin needs  to be as free of germs as possible. You can reduce the number of germs on your skin by washing with CHG (chlorahexidine gluconate) Soap before surgery.  CHG is an antiseptic cleaner which kills germs and bonds with the skin to continue killing germs even after washing.  Please do not use if you have an allergy to CHG or antibacterial soaps. If your skin becomes reddened/irritated stop using the CHG.  Do not shave (including legs and underarms) for at least 48 hours prior to first CHG shower. It is OK to shave your face.  Please follow these instructions carefully.   1. Shower the NIGHT BEFORE SURGERY and the MORNING OF SURGERY with CHG.   2. If you chose to wash your hair, wash your hair first as usual with your normal shampoo.  3. After you shampoo, rinse your hair and body thoroughly to remove the shampoo.  4. Use CHG as you would any other liquid soap. You can apply CHG directly to the skin and wash gently with a scrungie or a clean washcloth.   5. Apply the CHG Soap to your body ONLY FROM THE NECK DOWN.  Do not use on open wounds or open sores. Avoid contact with your eyes, ears, mouth and genitals (private parts). Wash genitals (private parts) with your normal soap.  6. Wash thoroughly, paying special attention to the area where your surgery will be performed.  7. Thoroughly rinse your body with warm water from the neck down.  8. DO NOT shower/wash  with your normal soap after using and rinsing off the CHG Soap.  9. Pat yourself dry with a CLEAN TOWEL.   10. Wear CLEAN PAJAMAS   11. Place CLEAN SHEETS on your bed the night of your first shower and DO NOT SLEEP WITH PETS.  Day of Surgery: Do not apply any deodorants/lotions. Please wear clean clothes to the hospital/surgery center.    Please read over the following fact sheets that you were given. Pain Booklet, Coughing and Deep Breathing and Surgical Site Infection Prevention

## 2017-01-15 ENCOUNTER — Encounter (HOSPITAL_COMMUNITY): Payer: Self-pay

## 2017-01-15 ENCOUNTER — Encounter (HOSPITAL_COMMUNITY)
Admission: RE | Admit: 2017-01-15 | Discharge: 2017-01-15 | Disposition: A | Discharge status: Home / Self Care | Payer: Managed Care, Other (non HMO) | Source: Ambulatory Visit | Attending: General Surgery | Admitting: General Surgery

## 2017-01-15 DIAGNOSIS — Z01818 Encounter for other preprocedural examination: Secondary | ICD-10-CM | POA: Diagnosis present

## 2017-01-15 DIAGNOSIS — I10 Essential (primary) hypertension: Secondary | ICD-10-CM | POA: Insufficient documentation

## 2017-01-15 DIAGNOSIS — K8020 Calculus of gallbladder without cholecystitis without obstruction: Secondary | ICD-10-CM | POA: Insufficient documentation

## 2017-01-15 DIAGNOSIS — Z01812 Encounter for preprocedural laboratory examination: Secondary | ICD-10-CM | POA: Diagnosis not present

## 2017-01-15 HISTORY — DX: Essential (primary) hypertension: I10

## 2017-01-15 LAB — QUANTIFERON TB GOLD ASSAY (BLOOD)
Interferon Gamma Release Assay: NEGATIVE
Mitogen-Nil: >10 IU/mL
Quantiferon Nil Value: 0.07 IU/mL

## 2017-01-15 MED ORDER — CHLORHEXIDINE GLUCONATE CLOTH 2 % EX PADS: 6.0000 | MEDICATED_PAD | Freq: Once | CUTANEOUS | Status: DC

## 2017-01-15 NOTE — Progress Notes (Signed)
PCP - Jill Alexanders Cardiologist - denies cardiologist or cardiac hx, pt reports possible normal ECHO over 20 years ago Rheumatologist- Dr. Estanislado Pandy, pt not to take enbrel or methotrexate until cleared from Dr. Grandville Silos of infection after surgery per patient  EKG - 01/15/2017    Patient denies shortness of breath, fever, cough and chest pain at PAT appointment   Patient verbalized understanding of instructions that were given to them at the PAT appointment. Patient was also instructed that they will need to review over the PAT instructions again at home before surgery.

## 2017-01-15 NOTE — Progress Notes (Signed)
wnl

## 2017-01-23 ENCOUNTER — Ambulatory Visit (HOSPITAL_COMMUNITY)
Admission: RE | Admit: 2017-01-23 | Discharge: 2017-01-23 | Disposition: A | Discharge status: Home / Self Care | Payer: Managed Care, Other (non HMO) | Source: Ambulatory Visit | Attending: General Surgery | Admitting: General Surgery

## 2017-01-23 ENCOUNTER — Encounter (HOSPITAL_COMMUNITY): Payer: Self-pay | Admitting: *Deleted

## 2017-01-23 ENCOUNTER — Ambulatory Visit (HOSPITAL_COMMUNITY): Payer: Managed Care, Other (non HMO) | Admitting: Certified Registered Nurse Anesthetist

## 2017-01-23 ENCOUNTER — Encounter (HOSPITAL_COMMUNITY)
Admission: RE | Disposition: A | Discharge status: Home / Self Care | Payer: Self-pay | Source: Ambulatory Visit | Attending: General Surgery

## 2017-01-23 DIAGNOSIS — G40909 Epilepsy, unspecified, not intractable, without status epilepticus: Secondary | ICD-10-CM | POA: Diagnosis not present

## 2017-01-23 DIAGNOSIS — Z87891 Personal history of nicotine dependence: Secondary | ICD-10-CM | POA: Insufficient documentation

## 2017-01-23 DIAGNOSIS — Z8249 Family history of ischemic heart disease and other diseases of the circulatory system: Secondary | ICD-10-CM | POA: Insufficient documentation

## 2017-01-23 DIAGNOSIS — Z85820 Personal history of malignant melanoma of skin: Secondary | ICD-10-CM | POA: Insufficient documentation

## 2017-01-23 DIAGNOSIS — Z803 Family history of malignant neoplasm of breast: Secondary | ICD-10-CM | POA: Insufficient documentation

## 2017-01-23 DIAGNOSIS — I10 Essential (primary) hypertension: Secondary | ICD-10-CM | POA: Insufficient documentation

## 2017-01-23 DIAGNOSIS — K8010 Calculus of gallbladder with chronic cholecystitis without obstruction: Secondary | ICD-10-CM | POA: Insufficient documentation

## 2017-01-23 DIAGNOSIS — Z833 Family history of diabetes mellitus: Secondary | ICD-10-CM | POA: Insufficient documentation

## 2017-01-23 DIAGNOSIS — M1990 Unspecified osteoarthritis, unspecified site: Secondary | ICD-10-CM | POA: Diagnosis not present

## 2017-01-23 DIAGNOSIS — Z823 Family history of stroke: Secondary | ICD-10-CM | POA: Insufficient documentation

## 2017-01-23 DIAGNOSIS — Z841 Family history of disorders of kidney and ureter: Secondary | ICD-10-CM | POA: Diagnosis not present

## 2017-01-23 DIAGNOSIS — K219 Gastro-esophageal reflux disease without esophagitis: Secondary | ICD-10-CM | POA: Insufficient documentation

## 2017-01-23 DIAGNOSIS — Z79899 Other long term (current) drug therapy: Secondary | ICD-10-CM | POA: Insufficient documentation

## 2017-01-23 DIAGNOSIS — Z836 Family history of other diseases of the respiratory system: Secondary | ICD-10-CM | POA: Diagnosis not present

## 2017-01-23 DIAGNOSIS — K828 Other specified diseases of gallbladder: Secondary | ICD-10-CM | POA: Insufficient documentation

## 2017-01-23 DIAGNOSIS — Z809 Family history of malignant neoplasm, unspecified: Secondary | ICD-10-CM | POA: Insufficient documentation

## 2017-01-23 HISTORY — PX: CHOLECYSTECTOMY: SHX55

## 2017-01-23 SURGERY — LAPAROSCOPIC CHOLECYSTECTOMY
Anesthesia: General | Site: Abdomen

## 2017-01-23 MED ORDER — METOCLOPRAMIDE HCL 5 MG/ML IJ SOLN
INTRAMUSCULAR | Status: AC
  Administered 2017-01-23: 10 mg via INTRAVENOUS
  Filled 2017-01-23: qty 2

## 2017-01-23 MED ORDER — SUCCINYLCHOLINE CHLORIDE 20 MG/ML IJ SOLN
INTRAMUSCULAR | Status: DC | PRN
  Administered 2017-01-23: 100 mg via INTRAVENOUS

## 2017-01-23 MED ORDER — IOPAMIDOL (ISOVUE-300) INJECTION 61%
INTRAVENOUS | Status: AC
  Filled 2017-01-23: qty 50

## 2017-01-23 MED ORDER — OXYCODONE HCL 5 MG PO TABS
5.0000 mg | ORAL_TABLET | Freq: Once | ORAL | Status: AC
  Administered 2017-01-23: 5 mg via ORAL

## 2017-01-23 MED ORDER — PROPOFOL 10 MG/ML IV BOLUS
INTRAVENOUS | Status: AC
  Filled 2017-01-23: qty 20

## 2017-01-23 MED ORDER — OXYCODONE HCL 5 MG PO TABS
ORAL_TABLET | ORAL | Status: AC
  Filled 2017-01-23: qty 1

## 2017-01-23 MED ORDER — MEPERIDINE HCL 25 MG/ML IJ SOLN: 6.2500 mg | INTRAMUSCULAR | Status: DC | PRN

## 2017-01-23 MED ORDER — SCOPOLAMINE 1 MG/3DAYS TD PT72
MEDICATED_PATCH | TRANSDERMAL | Status: AC
  Filled 2017-01-23: qty 1

## 2017-01-23 MED ORDER — FENTANYL CITRATE (PF) 100 MCG/2ML IJ SOLN
INTRAMUSCULAR | Status: AC
  Administered 2017-01-23: 50 ug via INTRAVENOUS
  Filled 2017-01-23: qty 2

## 2017-01-23 MED ORDER — MIDAZOLAM HCL 2 MG/2ML IJ SOLN
INTRAMUSCULAR | Status: AC
  Filled 2017-01-23: qty 2

## 2017-01-23 MED ORDER — ROCURONIUM BROMIDE 100 MG/10ML IV SOLN
INTRAVENOUS | Status: DC | PRN
  Administered 2017-01-23: 50 mg via INTRAVENOUS

## 2017-01-23 MED ORDER — CEFAZOLIN SODIUM-DEXTROSE 2-4 GM/100ML-% IV SOLN
2.0000 g | INTRAVENOUS | Status: AC
  Administered 2017-01-23: 2 g via INTRAVENOUS

## 2017-01-23 MED ORDER — PROMETHAZINE HCL 25 MG/ML IJ SOLN
INTRAMUSCULAR | Status: AC
  Filled 2017-01-23: qty 1

## 2017-01-23 MED ORDER — PROPOFOL 10 MG/ML IV BOLUS
INTRAVENOUS | Status: DC | PRN
Start: 2017-01-23 — End: 2017-01-23
  Administered 2017-01-23: 160 mg via INTRAVENOUS

## 2017-01-23 MED ORDER — PROMETHAZINE HCL 25 MG/ML IJ SOLN
6.2500 mg | Freq: Once | INTRAMUSCULAR | Status: AC
  Administered 2017-01-23: 6.25 mg via INTRAVENOUS

## 2017-01-23 MED ORDER — SUGAMMADEX SODIUM 200 MG/2ML IV SOLN
INTRAVENOUS | Status: DC | PRN
  Administered 2017-01-23: 200 mg via INTRAVENOUS

## 2017-01-23 MED ORDER — FENTANYL CITRATE (PF) 100 MCG/2ML IJ SOLN
INTRAMUSCULAR | Status: DC | PRN
  Administered 2017-01-23 (×3): 50 ug via INTRAVENOUS
  Administered 2017-01-23: 100 ug via INTRAVENOUS

## 2017-01-23 MED ORDER — SODIUM CHLORIDE 0.9 % IR SOLN
Status: DC | PRN
  Administered 2017-01-23: 1000 mL

## 2017-01-23 MED ORDER — MIDAZOLAM HCL 5 MG/5ML IJ SOLN
INTRAMUSCULAR | Status: DC | PRN
  Administered 2017-01-23: 2 mg via INTRAVENOUS

## 2017-01-23 MED ORDER — HEMOSTATIC AGENTS (NO CHARGE) OPTIME
TOPICAL | Status: DC | PRN
  Administered 2017-01-23: 1 via TOPICAL

## 2017-01-23 MED ORDER — BUPIVACAINE-EPINEPHRINE 0.25% -1:200000 IJ SOLN
INTRAMUSCULAR | Status: DC | PRN
  Administered 2017-01-23: 10 mL

## 2017-01-23 MED ORDER — SODIUM CHLORIDE 0.9 % IV SOLN
INTRAVENOUS | Status: DC | PRN
  Administered 2017-01-23: .1 mL

## 2017-01-23 MED ORDER — FENTANYL CITRATE (PF) 100 MCG/2ML IJ SOLN
25.0000 ug | INTRAMUSCULAR | Status: DC | PRN
Start: 2017-01-23 — End: 2017-01-23
  Administered 2017-01-23 (×3): 50 ug via INTRAVENOUS

## 2017-01-23 MED ORDER — DEXAMETHASONE SODIUM PHOSPHATE 10 MG/ML IJ SOLN
INTRAMUSCULAR | Status: DC | PRN
  Administered 2017-01-23: 5 mg via INTRAVENOUS

## 2017-01-23 MED ORDER — FENTANYL CITRATE (PF) 100 MCG/2ML IJ SOLN
INTRAMUSCULAR | Status: AC
  Filled 2017-01-23: qty 2

## 2017-01-23 MED ORDER — BUPIVACAINE-EPINEPHRINE (PF) 0.25% -1:200000 IJ SOLN
INTRAMUSCULAR | Status: AC
  Filled 2017-01-23: qty 30

## 2017-01-23 MED ORDER — ONDANSETRON HCL 4 MG/2ML IJ SOLN
INTRAMUSCULAR | Status: DC | PRN
  Administered 2017-01-23: 4 mg via INTRAVENOUS

## 2017-01-23 MED ORDER — FENTANYL CITRATE (PF) 250 MCG/5ML IJ SOLN
INTRAMUSCULAR | Status: AC
  Filled 2017-01-23: qty 5

## 2017-01-23 MED ORDER — METOCLOPRAMIDE HCL 5 MG/ML IJ SOLN
10.0000 mg | Freq: Once | INTRAMUSCULAR | Status: AC | PRN
  Administered 2017-01-23: 10 mg via INTRAVENOUS

## 2017-01-23 MED ORDER — 0.9 % SODIUM CHLORIDE (POUR BTL) OPTIME
TOPICAL | Status: DC | PRN
  Administered 2017-01-23: 1000 mL

## 2017-01-23 MED ORDER — OXYCODONE HCL 5 MG PO TABS: 5.0000 mg | ORAL_TABLET | ORAL | 0 refills | Status: DC | PRN

## 2017-01-23 MED ORDER — LACTATED RINGERS IV SOLN: INTRAVENOUS | Status: DC

## 2017-01-23 MED ORDER — CEFAZOLIN SODIUM-DEXTROSE 2-4 GM/100ML-% IV SOLN
INTRAVENOUS | Status: AC
  Filled 2017-01-23: qty 100

## 2017-01-23 MED ORDER — SCOPOLAMINE 1 MG/3DAYS TD PT72
1.0000 | MEDICATED_PATCH | TRANSDERMAL | Status: DC
  Administered 2017-01-23: 1.5 mg via TRANSDERMAL

## 2017-01-23 MED ORDER — LIDOCAINE HCL (CARDIAC) 20 MG/ML IV SOLN
INTRAVENOUS | Status: DC | PRN
  Administered 2017-01-23: 100 mg via INTRAVENOUS

## 2017-01-23 MED ORDER — LACTATED RINGERS IV SOLN
INTRAVENOUS | Status: DC
  Administered 2017-01-23: 10:00:00 via INTRAVENOUS

## 2017-01-23 SURGICAL SUPPLY — 45 items
ADH SKN CLS APL DERMABOND .7 (GAUZE/BANDAGES/DRESSINGS) ×2
APPLIER CLIP 5 13 M/L LIGAMAX5 (MISCELLANEOUS) ×3
APR CLP MED LRG 5 ANG JAW (MISCELLANEOUS) ×2
BAG SPEC RTRVL 10 TROC 200 (ENDOMECHANICALS) ×2
BLADE CLIPPER SURG (BLADE) IMPLANT
CANISTER SUCT 3000ML PPV (MISCELLANEOUS) ×3 IMPLANT
CHLORAPREP W/TINT 26ML (MISCELLANEOUS) ×3 IMPLANT
CLIP APPLIE 5 13 M/L LIGAMAX5 (MISCELLANEOUS) ×2 IMPLANT
COVER MAYO STAND STRL (DRAPES) ×3 IMPLANT
COVER SURGICAL LIGHT HANDLE (MISCELLANEOUS) ×3 IMPLANT
DERMABOND ADVANCED (GAUZE/BANDAGES/DRESSINGS) ×1
DERMABOND ADVANCED .7 DNX12 (GAUZE/BANDAGES/DRESSINGS) ×2 IMPLANT
DRAPE C-ARM 42X72 X-RAY (DRAPES) ×3 IMPLANT
ELECT REM PT RETURN 9FT ADLT (ELECTROSURGICAL) ×3
ELECTRODE REM PT RTRN 9FT ADLT (ELECTROSURGICAL) ×2 IMPLANT
FILTER SMOKE EVAC LAPAROSHD (FILTER) IMPLANT
GLOVE BIO SURGEON STRL SZ8 (GLOVE) ×3 IMPLANT
GLOVE BIOGEL PI IND STRL 8 (GLOVE) ×2 IMPLANT
GLOVE BIOGEL PI INDICATOR 8 (GLOVE) ×1
GOWN STRL REUS W/ TWL LRG LVL3 (GOWN DISPOSABLE) ×4 IMPLANT
GOWN STRL REUS W/ TWL XL LVL3 (GOWN DISPOSABLE) ×2 IMPLANT
GOWN STRL REUS W/TWL LRG LVL3 (GOWN DISPOSABLE) ×6
GOWN STRL REUS W/TWL XL LVL3 (GOWN DISPOSABLE) ×3
KIT BASIN OR (CUSTOM PROCEDURE TRAY) ×3 IMPLANT
KIT ROOM TURNOVER OR (KITS) ×3 IMPLANT
L-HOOK LAP DISP 36CM (ELECTROSURGICAL) ×3
LHOOK LAP DISP 36CM (ELECTROSURGICAL) ×2 IMPLANT
NEEDLE 22X1 1/2 (OR ONLY) (NEEDLE) ×3 IMPLANT
NS IRRIG 1000ML POUR BTL (IV SOLUTION) ×3 IMPLANT
PAD ARMBOARD 7.5X6 YLW CONV (MISCELLANEOUS) ×3 IMPLANT
PENCIL BUTTON HOLSTER BLD 10FT (ELECTRODE) ×3 IMPLANT
POUCH RETRIEVAL ECOSAC 10 (ENDOMECHANICALS) ×2 IMPLANT
POUCH RETRIEVAL ECOSAC 10MM (ENDOMECHANICALS) ×1
SCISSORS LAP 5X35 DISP (ENDOMECHANICALS) ×3 IMPLANT
SET CHOLANGIOGRAPH 5 50 .035 (SET/KITS/TRAYS/PACK) ×3 IMPLANT
SET IRRIG TUBING LAPAROSCOPIC (IRRIGATION / IRRIGATOR) ×3 IMPLANT
SLEEVE ENDOPATH XCEL 5M (ENDOMECHANICALS) ×6 IMPLANT
SPECIMEN JAR SMALL (MISCELLANEOUS) ×3 IMPLANT
SUT VIC AB 4-0 PS2 27 (SUTURE) ×3 IMPLANT
TOWEL OR 17X24 6PK STRL BLUE (TOWEL DISPOSABLE) ×3 IMPLANT
TOWEL OR 17X26 10 PK STRL BLUE (TOWEL DISPOSABLE) ×3 IMPLANT
TRAY LAPAROSCOPIC MC (CUSTOM PROCEDURE TRAY) ×3 IMPLANT
TROCAR XCEL BLUNT TIP 100MML (ENDOMECHANICALS) ×3 IMPLANT
TROCAR XCEL NON-BLD 5MMX100MML (ENDOMECHANICALS) ×3 IMPLANT
TUBING INSUFFLATION (TUBING) ×3 IMPLANT

## 2017-01-23 NOTE — Anesthesia Preprocedure Evaluation (Signed)
Anesthesia Evaluation  Patient identified by MRN, date of birth, ID band Patient awake    Reviewed: Allergy & Precautions, NPO status , Patient's Chart, lab work & pertinent test results  Airway Mallampati: II  TM Distance: >3 FB Neck ROM: Full    Dental  (+) Partial Upper   Pulmonary former smoker,    Pulmonary exam normal breath sounds clear to auscultation       Cardiovascular hypertension, Pt. on medications Normal cardiovascular exam Rhythm:Regular Rate:Normal     Neuro/Psych Seizures - (none in 20 years),  negative neurological ROS  negative psych ROS   GI/Hepatic Neg liver ROS,   Endo/Other  negative endocrine ROS  Renal/GU negative Renal ROS  negative genitourinary   Musculoskeletal negative musculoskeletal ROS (+)   Abdominal   Peds negative pediatric ROS (+)  Hematology negative hematology ROS (+)   Anesthesia Other Findings   Reproductive/Obstetrics negative OB ROS                            Anesthesia Physical Anesthesia Plan  ASA: II  Anesthesia Plan: General   Post-op Pain Management:    Induction: Intravenous  PONV Risk Score and Plan: 3 and Ondansetron, Dexamethasone, Midazolam and Treatment may vary due to age or medical condition  Airway Management Planned: Oral ETT  Additional Equipment:   Intra-op Plan:   Post-operative Plan: Extubation in OR  Informed Consent: I have reviewed the patients History and Physical, chart, labs and discussed the procedure including the risks, benefits and alternatives for the proposed anesthesia with the patient or authorized representative who has indicated his/her understanding and acceptance.   Dental advisory given  Plan Discussed with: CRNA  Anesthesia Plan Comments:         Anesthesia Quick Evaluation

## 2017-01-23 NOTE — Transfer of Care (Signed)
Immediate Anesthesia Transfer of Care Note  Patient: Kimberly Butler  Procedure(s) Performed: Procedure(s): LAPAROSCOPIC CHOLECYSTECTOMY (N/A)  Patient Location: PACU  Anesthesia Type:General  Level of Consciousness: awake, alert , oriented and patient cooperative  Airway & Oxygen Therapy: Patient Spontanous Breathing and Patient connected to nasal cannula oxygen  Post-op Assessment: Report given to RN and Post -op Vital signs reviewed and stable  Post vital signs: Reviewed and stable  Last Vitals:  Vitals:   01/23/17 0832 01/23/17 1135  BP: 121/66   Pulse: (!) 49   Resp: 18   Temp: (!) 36.4 C (P) 36.6 C  SpO2: 99%     Last Pain:  Vitals:   01/23/17 0832  TempSrc: Oral         Complications: No apparent anesthesia complications

## 2017-01-23 NOTE — Op Note (Signed)
01/23/2017  11:25 AM  PATIENT:  Kimberly Butler  61 y.o. female  PRE-OPERATIVE DIAGNOSIS:  symptomatic cholelithiasis  POST-OPERATIVE DIAGNOSIS:  symptomatic cholelithiasis  PROCEDURE:  Procedure(s): LAPAROSCOPIC CHOLECYSTECTOMY  SURGEON:  Surgeon(s): Georganna Skeans, MD  ASSISTANTS: Judyann Munson, RNFA   ANESTHESIA:   local and general  EBL:  Total I/O In: 39 [I.V.:800] Out: -   BLOOD ADMINISTERED:none  DRAINS: none   SPECIMEN:  Excision  DISPOSITION OF SPECIMEN:  PATHOLOGY  COUNTS:  YES  DICTATION: .Dragon Dictation Procedure in detail: [name] presents for laparoscopic cholecystectomy. She was identified in the preop holding area. Informed consent was obtained. She received intravenous antibiotics. She was taken to the operating room and general endotracheal anesthesia was administered by the anesthesia staff. Her abdomen was prepped and draped in a sterile fashion. Time out was performed.The infraumbilical region was infiltrated with local. Infraumbilical incision was made. Subcutaneous tissues were dissected down revealing the anterior fascia. This was divided sharply along the midline. Peritoneal cavity was entered under direct vision without complication. A 0 Vicryl pursestring was placed around the fascial opening. Hassan trocar was inserted into the abdomen. The abdomen was insufflated with carbon dioxide in standard fashion. Under direct vision a 5 mm epigastric and 5 mm right port 2 were placed. Local was used at each port site. There were some adhesions from the dome the liver to the anterior abdominal wall and these were taken down with cautery. The dome of the gallbladder was retracted superior medially. The infundibulum was retracted inferior laterally. Dissection began laterally and progressed medially first identifying the cystic duct. Dissection continued until a large window was created between the cystic duct, liver, and the gallbladder. Once we had this critical  view, it was noted that the cystic duct was very narrow. This precluded cholangiogram. 2 clips were placed proximally on the cystic duct, one was placed distally and it was divided. Further dissection revealed the cystic artery which was clipped twice proximally, once distally and divided. The gallbladder was taken off the liver bed. Cautery was used achieving good hemostasis along the way. The gallbladder was placed in an Ecosac and removed from the abdomen. It was sent to pathology. Liver bed was copiously irrigated. Liver bed was cauterized again and a piece of Surgicel snow was applied. There was excellent hemostasis and. Clips were rechecked and remained in good position. Irrigation fluid was evacuated. The area was completely dry. Ports were removed under direct vision. Pneumoperitoneum was released. Infraumbilical fascia was closed by tying the pursestring. The wounds were each closed with running 4 Vicryl subcuticular followed by Dermabond. All counts were correct. She tolerated procedure well without apparent complication was taken recovery in stable condition.  PATIENT DISPOSITION:  PACU - hemodynamically stable.   Delay start of Pharmacological VTE agent (>24hrs) due to surgical blood loss or risk of bleeding:  no  Georganna Skeans, MD, MPH, FACS Pager: [phone]  8/16/201811:25 AM

## 2017-01-23 NOTE — Anesthesia Postprocedure Evaluation (Signed)
Anesthesia Post Note  Patient: Kimberly Butler  Procedure(s) Performed: Procedure(s) (LRB): LAPAROSCOPIC CHOLECYSTECTOMY (N/A)     Patient location during evaluation: PACU Anesthesia Type: General Level of consciousness: awake and alert Pain management: pain level controlled Vital Signs Assessment: post-procedure vital signs reviewed and stable Respiratory status: spontaneous breathing, nonlabored ventilation, respiratory function stable and patient connected to nasal cannula oxygen Cardiovascular status: blood pressure returned to baseline and stable Postop Assessment: no signs of nausea or vomiting Anesthetic complications: no    Last Vitals:  Vitals:   01/23/17 1219 01/23/17 1234  BP: 121/75 117/76  Pulse: (!) 44 (!) 58  Resp: (!) 8 13  Temp:    SpO2: 94% 95%    Last Pain:  Vitals:   01/23/17 1235  TempSrc:   PainSc: 4                  Montez Hageman

## 2017-01-23 NOTE — Anesthesia Procedure Notes (Signed)
Procedure Name: Intubation Date/Time: 01/23/2017 10:26 AM Performed by: Salli Quarry Xue Low Pre-anesthesia Checklist: Patient identified, Emergency Drugs available, Suction available and Patient being monitored Patient Re-evaluated:Patient Re-evaluated prior to induction Oxygen Delivery Method: Circle System Utilized Preoxygenation: Pre-oxygenation with 100% oxygen Induction Type: IV induction and Rapid sequence Laryngoscope Size: Mac and 3 Grade View: Grade I Tube type: Oral Tube size: 7.0 mm Number of attempts: 1 Airway Equipment and Method: Stylet Placement Confirmation: ETT inserted through vocal cords under direct vision,  positive ETCO2 and breath sounds checked- equal and bilateral Secured at: 22 cm Tube secured with: Tape Dental Injury: Teeth and Oropharynx as per pre-operative assessment

## 2017-01-23 NOTE — Interval H&P Note (Signed)
History and Physical Interval Note:  01/23/2017 10:10 AM  Kimberly Butler  has presented today for surgery, with the diagnosis of symptomatic cholelithiasis  The various methods of treatment have been discussed with the patient and family. After consideration of risks, benefits and other options for treatment, the patient has consented to  Procedure(s): LAPAROSCOPIC CHOLECYSTECTOMY WITH INTRAOPERATIVE CHOLANGIOGRAM (N/A) as a surgical intervention .  The patient's history has been reviewed, patient examined, no change in status, stable for surgery.  I have reviewed the patient's chart and labs.  Questions were answered to the patient's satisfaction.     [name] E

## 2017-01-23 NOTE — H&P (Signed)
Kimberly Butler 01/01/2017 10:06 AM Location: [facility] Patient #: [id number] DOB: [birth year] Married / Language: Cleophus Molt / Race: White Female   History of Present Illness Kimberly Neri E. Grandville Silos MD; 01/01/2017 10:27 AM) The patient is a 61 year old female who presents for evaluation of gallbladder disease. I was asked to see Kimberly Butler in consultation by Dr. Redmond School regarding symptomatic cholelithiasis. She has been having abdominal bloating and epigastric fullness on and off for the past couple of months. She underwent ultrasound which demonstrated multiple gallstones and some edema in the wall of her gallbladder. She also suffers some constipation due to the iron she needs to take. She has not noticed any discoloration of her urine. She is taking a stool softener at this time.   Past Surgical History Benjiman Core, [place]; 01/01/2017 10:15 AM) Cataract Surgery  Bilateral. Cesarean Section - 1  Tonsillectomy   Diagnostic Studies History Benjiman Core, CMA; 01/01/2017 10:15 AM) Colonoscopy  5-10 years ago Mammogram  within last year Pap Smear  1-5 years ago  Allergies Benjiman Core, CMA; 01/01/2017 10:19 AM) No Known Drug Allergies 01/01/2017  Medication History (Armen Eulas Post, CMA; 01/01/2017 10:22 AM) Dilantin (100MG  Capsule, Oral) Active. Enbrel SureClick (50MG /ML Soln Auto-inj, Subcutaneous) Active. Folic Acid (1MG  Tablet, Oral) Active. Losartan Potassium-HCTZ (50-12.5MG  Tablet, Oral) Active. Pantoprazole Sodium (40MG  Tablet DR, Oral) Active. Iron (Ferrous Sulfate) (325MG  Tablet, Oral) Active. Multivital (Oral) Active. Vitamin D3 (10000UNIT Capsule, Oral) Active. Vitamin B-12 (1000MCG Tablet, Oral) Active. Stool Softener (100MG  Capsule, Oral) Active. Biotin (10000MCG Tablet, Oral) Active. Medications Reconciled  Social History Benjiman Core, CMA; 01/01/2017 10:15 AM) Alcohol use  Remotely quit alcohol use. Caffeine use  Coffee, Tea. No drug use  Tobacco use   Former smoker.  Family History Benjiman Core, [place]; 01/01/2017 10:15 AM) Breast Cancer  Mother. Cancer  Brother, Sister. Cerebrovascular Accident  Father, Mother. Diabetes Mellitus  Father. Heart Disease  Mother. Heart disease in female family member before age 61  Hypertension  Mother. Kidney Disease  Father. Respiratory Condition  Mother.  Pregnancy / Birth History Benjiman Core, [place]; 01/01/2017 10:15 AM) Age at menarche  70 years. Age of menopause  61-50 Contraceptive History  Oral contraceptives. Gravida  3 Maternal age  39-25 Para  19  Other Problems Benjiman Core, [place]; 01/01/2017 10:15 AM) Arthritis  Gastroesophageal Reflux Disease  High blood pressure  Melanoma  Seizure Disorder     Review of Systems (Armen Glenn CMA; 01/01/2017 10:15 AM) General Present- Weight Gain. Not Present- Appetite Loss, Chills, Fatigue, Fever, Night Sweats and Weight Loss. Skin Not Present- Change in Wart/Mole, Dryness, Hives, Jaundice, New Lesions, Non-Healing Wounds, Rash and Ulcer. HEENT Present- Ringing in the Ears and Wears glasses/contact lenses. Not Present- Earache, Hearing Loss, Hoarseness, Nose Bleed, Oral Ulcers, Seasonal Allergies, Sinus Pain, Sore Throat, Visual Disturbances and Yellow Eyes. Respiratory Not Present- Bloody sputum, Chronic Cough, Difficulty Breathing, Snoring and Wheezing. Breast Not Present- Breast Mass, Breast Pain, Nipple Discharge and Skin Changes. Cardiovascular Present- Swelling of Extremities. Not Present- Chest Pain, Difficulty Breathing Lying Down, Leg Cramps, Palpitations, Rapid Heart Rate and Shortness of Breath. Gastrointestinal Present- Bloating, Constipation, Excessive gas and Gets full quickly at meals. Not Present- Abdominal Pain, Bloody Stool, Change in Bowel Habits, Chronic diarrhea, Difficulty Swallowing, Hemorrhoids, Indigestion, Nausea, Rectal Pain and Vomiting. Female Genitourinary Not Present- Frequency, Nocturia, Painful Urination,  Pelvic Pain and Urgency. Musculoskeletal Present- Joint Stiffness. Not Present- Back Pain, Joint Pain, Muscle Pain, Muscle Weakness and Swelling of Extremities. Neurological Not Present- Decreased Memory,  Fainting, Headaches, Numbness, Seizures, Tingling, Tremor, Trouble walking and Weakness. Psychiatric Not Present- Anxiety, Bipolar, Change in Sleep Pattern, Depression, Fearful and Frequent crying. Endocrine Not Present- Cold Intolerance, Excessive Hunger, Hair Changes, Heat Intolerance, Hot flashes and New Diabetes. Hematology Not Present- Blood Thinners, Easy Bruising, Excessive bleeding, Gland problems, HIV and Persistent Infections.  Vitals (Armen Glenn CMA; 01/01/2017 10:19 AM) 01/01/2017 10:18 AM Weight: 228 lb Height: 67in Body Surface Area: 2.14 m Body Mass Index: 35.71 kg/m  Temp.: 97.42F  Pulse: 65 (Regular)  P.OX: 99% (Room air) BP: 124/82 (Sitting, Left Arm, Standard)       Physical Exam Kimberly Neri E. Grandville Silos MD; 01/01/2017 10:28 AM) General Mental Status-Alert. General Appearance-Consistent with stated age. Hydration-Well hydrated. Voice-Normal.  Head and Neck Head-normocephalic, atraumatic with no lesions or palpable masses. Trachea-midline. Thyroid Gland Characteristics - normal size and consistency.  Eye Eyeball - Bilateral-Extraocular movements intact. Sclera/Conjunctiva - Bilateral-No scleral icterus.  Chest and Lung Exam Chest and lung exam reveals -quiet, even and easy respiratory effort with no use of accessory muscles and on auscultation, normal breath sounds, no adventitious sounds and normal vocal resonance. Inspection Chest Wall - Normal. Back - normal.  Cardiovascular Cardiovascular examination reveals -normal heart sounds, regular rate and rhythm with no murmurs and normal pedal pulses bilaterally.  Abdomen Inspection Inspection of the abdomen reveals - No Hernias. Palpation/Percussion Palpation and Percussion of  the abdomen reveal - Soft, Non Tender, No Rebound tenderness, No Rigidity (guarding) and No hepatosplenomegaly. Auscultation Auscultation of the abdomen reveals - Bowel sounds normal. Note: No significant tenderness in the right upper quadrant or elsewhere   Neurologic Neurologic evaluation reveals -alert and oriented x 3 with no impairment of recent or remote memory. Mental Status-Normal.  Musculoskeletal Global Assessment -Note: no gross deformities.  Normal Exam - Left-Upper Extremity Strength Normal and Lower Extremity Strength Normal. Normal Exam - Right-Upper Extremity Strength Normal and Lower Extremity Strength Normal.  Lymphatic Head & Neck  General Head & Neck Lymphatics: Bilateral - Description - Normal. Axillary  General Axillary Region: Bilateral - Description - Normal. Tenderness - Non Tender. Femoral & Inguinal  Generalized Femoral & Inguinal Lymphatics: Bilateral - Description - No Generalized lymphadenopathy.    Assessment & Plan Kimberly Neri E. Grandville Silos MD; 01/01/2017 10:29 AM) SYMPTOMATIC CHOLELITHIASIS (K80.20) Impression: I have offered laparoscopic cholecystectomy with intraoperative echocardiogram. I discussed the procedure, risks, and benefits with her in detail. She is agreeable. I discussed the expected postoperative course. She will need to avoid heavy lifting for 4 weeks after surgery. She requests and August surgery date due to her insurance. Current Plans Pt Education - Pamphlet Given - Laparoscopic Gallbladder Surgery: discussed with patient and provided information.  01/23/17 exam unchanged. Ready for surgery.  Georganna Skeans, MD, MPH, FACS Trauma: [phone] General Surgery: [phone]

## 2017-01-24 ENCOUNTER — Telehealth: Payer: Self-pay | Admitting: Radiology

## 2017-01-24 ENCOUNTER — Other Ambulatory Visit: Payer: Self-pay | Admitting: Radiology

## 2017-01-24 ENCOUNTER — Encounter (HOSPITAL_COMMUNITY): Payer: Self-pay | Admitting: General Surgery

## 2017-01-24 MED ORDER — ETANERCEPT 50 MG/ML ~~LOC~~ SOAJ: 50.0000 mg | SUBCUTANEOUS | 0 refills | Status: DC

## 2017-01-24 NOTE — Telephone Encounter (Signed)
01/13/17 last visit and labs including TB gold Ok to refill per Dr Estanislado Pandy

## 2017-01-24 NOTE — Telephone Encounter (Signed)
Refill request received via fax for Enbrel from [facility]

## 2017-02-03 ENCOUNTER — Ambulatory Visit (INDEPENDENT_AMBULATORY_CARE_PROVIDER_SITE_OTHER): Payer: Managed Care, Other (non HMO) | Admitting: Family Medicine

## 2017-02-03 ENCOUNTER — Encounter: Payer: Self-pay | Admitting: Family Medicine

## 2017-02-03 VITALS — BP 128/70 | HR 67 | Wt 225.0 lb

## 2017-02-03 DIAGNOSIS — L259 Unspecified contact dermatitis, unspecified cause: Secondary | ICD-10-CM

## 2017-02-03 NOTE — Patient Instructions (Signed)
Cold compresses on the areas that itch a lot also cortisone cream and Benadryl at night

## 2017-02-03 NOTE — Progress Notes (Signed)
   Subjective:    Patient ID: Kimberly Butler, female    DOB: January 29, 1956, 61 y.o.   MRN: [id number]  HPI She complains of a rash on her left side of her face, neck and upper chest area. No exposure to any new chemicals. Lesions are not linear in nature. No lesions anywhere else.   Review of Systems     Objective:   Physical Exam swelling and erythema is noted to the left side of the face including upper eyelid and cheek area. Also erythema noted to the anterior chest but in a diffuse nonlinear or well-demarcated area.      Assessment & Plan:  Contact dermatitis, unspecified contact dermatitis type, unspecified trigger Appears to be a contact irritant but not a direct contact as there is no distinct delineation to this. Recommend cool compresses, Benadryl at night and cortisone cream.

## 2017-02-05 ENCOUNTER — Other Ambulatory Visit: Payer: Self-pay | Admitting: Rheumatology

## 2017-02-05 NOTE — Telephone Encounter (Signed)
Last Visit: 01/13/17 Next Visit due January 2019. Message sent to the front to schedule patient. Labs: 01/13/17 WNL  Okay to refill per Dr. Estanislado Pandy

## 2017-02-13 ENCOUNTER — Telehealth: Payer: Self-pay | Admitting: Rheumatology

## 2017-02-13 NOTE — Telephone Encounter (Signed)
Patient states her insurance has changed and she now has to use [facility] for MTX, enbrel and folic acid. Patient provided a phone number to call for an authorization [phone] and then provided a number to call to call the meds in after the authorization is approved. [phone].

## 2017-02-14 ENCOUNTER — Telehealth: Payer: Self-pay

## 2017-02-14 ENCOUNTER — Telehealth: Payer: Self-pay | Admitting: Family Medicine

## 2017-02-14 NOTE — Telephone Encounter (Signed)
A prior authorization for Enbrel Sureclick was submitted to pts insurance via cover my meds. Received a rejection stating that an ePA request was already received. Called [facility] to verify. Spoke with Yvetta Coder who states that there was not an authorization on file. She submitted the request over the phone. The request is being processed. We should have an answer within 371 hours. Will update once we receive a response.   Phone: [phone]  Demetrios Loll, CPhT 12:43 PM

## 2017-02-14 NOTE — Telephone Encounter (Signed)
Patient returned call. She gave me her new card information:  [place]BS[place] BIN: [id number] PCN: [id number] ID: [id number] GRP: [id number]  She also mentioned that she would be interested in using [facility] if her insurance allowed. I will submit a PA and contact her once we receive a response. Patient voices understanding and denies any questions at this time.   Woody Kronberg, [place], CPhT 12:01 PM

## 2017-02-14 NOTE — Telephone Encounter (Signed)
Pt called states new ins requires P.A. On any brand name meds, and Dilantin is brand only.  Called t# [phone] and initiated P.A. [place] ID # [id number],

## 2017-02-14 NOTE — Telephone Encounter (Signed)
Called the number provided to submit a prior authorization. Spoke with Kimberly Butler who states that she can't find the patient in the system.  Called patient to get her insurance Id and contact number to the provider services to process her PA. Left message for patient to call back.  Kimberly Butler, [place], CPhT 10:30 AM

## 2017-02-17 ENCOUNTER — Telehealth: Payer: Self-pay | Admitting: Family Medicine

## 2017-02-17 DIAGNOSIS — I10 Essential (primary) hypertension: Secondary | ICD-10-CM

## 2017-02-17 DIAGNOSIS — G40909 Epilepsy, unspecified, not intractable, without status epilepticus: Secondary | ICD-10-CM

## 2017-02-17 MED ORDER — DILANTIN 100 MG PO CAPS: 200.0000 mg | ORAL_CAPSULE | Freq: Two times a day (BID) | ORAL | 3 refills | Status: DC

## 2017-02-17 MED ORDER — METHOTREXATE SODIUM CHEMO INJECTION 50 MG/2ML: INTRAMUSCULAR | 0 refills | Status: DC

## 2017-02-17 MED ORDER — LOSARTAN POTASSIUM-HCTZ 50-12.5 MG PO TABS: ORAL_TABLET | ORAL | 0 refills | Status: DC

## 2017-02-17 MED ORDER — ETANERCEPT 50 MG/ML ~~LOC~~ SOAJ: 50.0000 mg | SUBCUTANEOUS | 0 refills | Status: DC

## 2017-02-17 MED ORDER — PANTOPRAZOLE SODIUM 40 MG PO TBEC: 40.0000 mg | DELAYED_RELEASE_TABLET | Freq: Every day | ORAL | 0 refills | Status: DC

## 2017-02-17 NOTE — Addendum Note (Signed)
Addended by: Carole Binning on: 02/17/2017 02:09 PM   Modules accepted: Orders

## 2017-02-17 NOTE — Telephone Encounter (Signed)
Pt is taking 100mg  tablets of Dilantin 2 tablets bid. Called in to [facility].  Victor[place] December

## 2017-02-17 NOTE — Telephone Encounter (Signed)
Have the patient find out what is covered under her insurance

## 2017-02-17 NOTE — Telephone Encounter (Signed)
Received a fax from [employer] (Patients new insurance) regarding a prior authorization approval for Enbrel from 02/14/17 to 02/14/2018.   Reference number: [id number]  Will send document to scan center.  Called patient to update her. Patient states there her Rx needs to be sent to [facility]. She injected her last pen yesterday (02/17/17). Patient also states that she would like to get her MTX from [facility].    Will you send in a new Rx for Enbrel and MTX to [facility]? Thanks!  Dean Wonder, [place], CPhT 9:03 AM

## 2017-02-17 NOTE — Telephone Encounter (Signed)
Pt needs refills Pantoprazole and Losartan to [facility] for 90 days each

## 2017-02-17 NOTE — Telephone Encounter (Signed)
P.A. Approved til 02/14/18, pt informed

## 2017-02-17 NOTE — Telephone Encounter (Signed)
[facility] [place]  Pantoprazole sod dr 40 mg Is not on patient's formulary, wants alternative

## 2017-02-17 NOTE — Telephone Encounter (Signed)
Give her the Dilantin

## 2017-02-17 NOTE — Telephone Encounter (Signed)
Pt aware. Can you advise if refill of Dilantin is ok?  Kimberly Butler

## 2017-02-17 NOTE — Telephone Encounter (Signed)
Last Visit: 01/13/17 Next Visit due January 2019. Message sent to the front to schedule patient. Labs: 01/13/17 WNL Tb Gold: 01/13/17 Neg    Okay to refill per Dr. Estanislado Pandy

## 2017-02-18 ENCOUNTER — Telehealth: Payer: Self-pay | Admitting: Rheumatology

## 2017-02-18 ENCOUNTER — Telehealth: Payer: Self-pay | Admitting: Family Medicine

## 2017-02-18 DIAGNOSIS — G40909 Epilepsy, unspecified, not intractable, without status epilepticus: Secondary | ICD-10-CM

## 2017-02-18 MED ORDER — METHOTREXATE SODIUM CHEMO INJECTION 50 MG/2ML: 17.5000 mg | INTRAMUSCULAR | 0 refills | Status: DC

## 2017-02-18 MED ORDER — PANTOPRAZOLE SODIUM 40 MG PO TBEC: 40.0000 mg | DELAYED_RELEASE_TABLET | Freq: Every day | ORAL | 0 refills | Status: DC

## 2017-02-18 MED ORDER — "TUBERCULIN-ALLERGY SYRINGES 27G X 1/2"" 1 ML MISC": 3 refills | Status: DC

## 2017-02-18 MED ORDER — DILANTIN 100 MG PO CAPS: 200.0000 mg | ORAL_CAPSULE | Freq: Two times a day (BID) | ORAL | 3 refills | Status: DC

## 2017-02-18 NOTE — Telephone Encounter (Signed)
Patient needs to speak to you again re RX's.

## 2017-02-18 NOTE — Telephone Encounter (Signed)
I sent dil[facility]in to [facility] [address]. Losartan was already [facility] yesterday. I have [facility] protonix [facility]

## 2017-02-18 NOTE — Telephone Encounter (Signed)
[facility] [place] [address] [facility] [address]

## 2017-02-18 NOTE — Telephone Encounter (Signed)
Patient would prefer to have her MTX sent to her local pharmacy. Patient also needs a refill on syringes. Prescriptions sent to pharmacy as they had been approved previously and sent to mail order pharmacy.

## 2017-02-18 NOTE — Telephone Encounter (Signed)
Pt called and needs her dil[facility]in re sent to [id number] [place] [place] - [address] and her losartan and protonix re sent to the [place], [place] she also states that [employer] tartan needs to be canceled at [facility] until it is canceled at. She can be reached at [phone]

## 2017-02-18 NOTE — Telephone Encounter (Signed)
Patient states rxs were sent to [facility], but needs to go thru [facility] due to insurance. Please send rx for MTX,and Enbrel to [facility]. Patient is out of medication. Fax# [phone], Phone# [phone]

## 2017-02-18 NOTE — Telephone Encounter (Signed)
Left message to advise patient prescription were sent to [facility] as she had requested.

## 2017-02-20 ENCOUNTER — Telehealth: Payer: Self-pay | Admitting: Rheumatology

## 2017-02-20 ENCOUNTER — Telehealth: Payer: Self-pay

## 2017-02-20 MED ORDER — OMEPRAZOLE 40 MG PO CPDR: 40.0000 mg | DELAYED_RELEASE_CAPSULE | Freq: Every day | ORAL | 3 refills | Status: DC

## 2017-02-20 NOTE — Telephone Encounter (Signed)
Left message on machine for patient to call back to schedule follow up appointment.

## 2017-02-20 NOTE — Telephone Encounter (Signed)
[facility] called to report that pantoprazole is not formulary. Is it ok to change pt to omeprazole 40mg ?   CB # [id number] Ref # [id number]

## 2017-02-20 NOTE — Telephone Encounter (Signed)
-----   Message from Carole Binning, LPN sent at 06/10/7508  1:56 PM EDT ----- Regarding: Please schedule patient a follow up visit. Please schedule patient a follow up visit. Patient is due January 2019. Thanks!

## 2017-03-25 NOTE — Progress Notes (Signed)
Office Visit Note  Patient: Kimberly Butler             Date of Birth: April 19, 1956           MRN: [id number]             PCP: Denita Lung, MD Referring: Denita Lung, MD Visit Date: 04/07/2017 Occupation: @GUAROCC @    Subjective:  Right ankle swelling.   History of Present Illness: Kimberly Butler is a 61 y.o. female with history of psoriatic arthritis psoriasis and osteoarthritis. In September she underwent cholecystectomy for that she had to stop methotrexate and Enbrel for a week. She states after that she had a flare with increased joint pain and swelling in psoriasis. The symptoms are getting better now since she's resumed her medications. She has some stiffness off-and-on in her hands and feet. She complains of off and on discomfort in her right ankle. Her knee joints are doing better. Her left trochanteric bursitis is improved as well.  Activities of Daily Living:  Patient reports morning stiffness for 0 none.   Patient Denies nocturnal pain.  Difficulty dressing/grooming: Denies Difficulty climbing stairs: Reports Difficulty getting out of chair: Denies Difficulty using hands for taps, buttons, cutlery, and/or writing: Denies   Review of Systems  Constitutional: Positive for fatigue. Negative for night sweats, weight gain, weight loss and weakness.  HENT: Negative for mouth sores, trouble swallowing, trouble swallowing, mouth dryness and nose dryness.   Eyes: Negative for pain, redness, visual disturbance and dryness.  Respiratory: Negative for cough, shortness of breath and difficulty breathing.   Cardiovascular: Negative for chest pain, palpitations, hypertension, irregular heartbeat and swelling in legs/feet.  Gastrointestinal: Positive for constipation, heartburn, nausea and vomiting. Negative for blood in stool and diarrhea.  Endocrine: Negative for excessive thirst and increased urination.  Genitourinary: Negative for painful urination and vaginal dryness.    Musculoskeletal: Positive for joint swelling and muscle weakness. Negative for arthralgias, joint pain, myalgias, morning stiffness, muscle tenderness and myalgias.  Skin: Positive for rash. Negative for color change, hair loss, skin tightness, ulcers and sensitivity to sunlight.  Allergic/Immunologic: Negative for susceptible to infections.  Neurological: Negative for dizziness, numbness, memory loss and night sweats.  Hematological: Negative for bruising/bleeding tendency and swollen glands.  Psychiatric/Behavioral: Negative for depressed mood and sleep disturbance. The patient is not nervous/anxious.     PMFS History:  Patient Active Problem List   Diagnosis Date Noted  . Lactose intolerance in adult 11/18/2016  . Trochanteric bursitis, left hip 08/28/2016  . Post-menopausal 08/28/2016  . High risk medication use 08/28/2016  . History of vitamin D deficiency 08/28/2016  . History of gastroesophageal reflux (GERD) 08/28/2016  . History of iron deficiency anemia 08/28/2016  . Primary osteoarthritis of both hands 08/28/2016  . Primary osteoarthritis of both knees 08/28/2016  . History of epilepsy 08/28/2016  . Sacro-iliac pain 08/28/2016  . Psoriasis 07/07/2014  . Seizure disorder ([place]) 01/07/2011  . GERD (gastroesophageal reflux disease) 01/07/2011  . Hypertension 01/07/2011  . Psoriatic arthritis ([place]) 01/07/2011  . Allergic rhinitis due to pollen 01/07/2011  . Obesity (BMI 30-39.9) 01/07/2011    Past Medical History:  Diagnosis Date  . Allergy    RHINITIS  . 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 Epilepsy ([place])    hasn't had seizure in 20 years (01/15/17)  . GERD (gastroesophageal reflux disease)   . Hypertension   . Obesity   . Psoriasis   . Psoriatic  arthritis ([place])   . Smoker   . Varicose veins     Family History  Problem Relation Age of Onset  . Cancer Mother   . Heart disease Mother   . Arthritis Father   . Kidney failure Father   . Dementia Father   .  Diabetes Father   . Cancer Sister   . Cancer Brother    Past Surgical History:  Procedure Laterality Date  . CATARACT EXTRACTION W/ INTRAOCULAR LENS IMPLANT Bilateral   . CESAREAN SECTION    . CHOLECYSTECTOMY N/A 01/23/2017   Procedure: LAPAROSCOPIC CHOLECYSTECTOMY;  Surgeon: Georganna Skeans, MD;  Location: [facility];  Service: General;  Laterality: N/A;  . TONSILECTOMY, ADENOIDECTOMY, BILATERAL MYRINGOTOMY AND TUBES    . TUBAL LIGATION     Social History   Social History Narrative  . No narrative on file     Objective: Vital Signs: BP 130/74 (BP Location: Left Arm, Patient Position: Sitting, Cuff Size: Normal)   Pulse (!) 51   Resp 16   Ht 5\' 7"  (1.702 m)   Wt 226 lb (102.5 kg)   BMI 35.40 kg/m    Physical Exam  Constitutional: She is oriented to person, place, and time. She appears well-developed and well-nourished.  HENT:  Head: Normocephalic and atraumatic.  Eyes: Conjunctivae and EOM are normal.  Neck: Normal range of motion.  Cardiovascular: Normal rate, regular rhythm, normal heart sounds and intact distal pulses.   Pulmonary/Chest: Effort normal and breath sounds normal.  Abdominal: Soft. Bowel sounds are normal.  Lymphadenopathy:    She has no cervical adenopathy.  Neurological: She is alert and oriented to person, place, and time.  Skin: Skin is warm and dry. Capillary refill takes less than 2 seconds.  Nail dystrophy in bilateral feet noted.  Psychiatric: She has a normal mood and affect. Her behavior is normal.  Nursing note and vitals reviewed.    Musculoskeletal Exam: C-spine and thoracic lumbar spine good range of motion. Shoulder joints elbow joints wrist joints are good range of motion. She has some DIP PIP thickening with no synovitis. Hip joints knee joints are good range of motion. She has some thickening of her right ankle joint with no tenderness on palpation. Left ankle joint was good range of motion. She is some tenderness across her MTP joints. But  no synovitis was noted.  CDAI Exam: CDAI Homunculus Exam:   Joint Counts:  CDAI Tender Joint count: 0 CDAI Swollen Joint count: 0  Global Assessments:  Patient Global Assessment: 4 Provider Global Assessment: 2  CDAI Calculated Score: 6    Investigation: No additional findings.TB Gold: Negative 01/2017 CBC Latest Ref Rng & Units 01/13/2017 08/30/2016 05/31/2016  WBC 3.8 - 10.8 K/uL 8.1 6.0 6.1  Hemoglobin 11.7 - 15.5 g/dL 12.5 12.1 11.3(L)  Hematocrit 35.0 - 45.0 % 37.2 36.4 33.8(L)  Platelets 140 - 400 K/uL 250 244 280   CMP Latest Ref Rng & Units 01/13/2017 08/30/2016 05/31/2016  Glucose 65 - 99 mg/dL 75 92 82  BUN 7 - 25 mg/dL 18 13 12   Creatinine 0.50 - 0.99 mg/dL 0.55 0.66 0.61  Sodium 135 - 146 mmol/L 140 141 142  Potassium 3.5 - 5.3 mmol/L 4.3 4.8 4.3  Chloride 98 - 110 mmol/L 103 104 104  CO2 20 - 32 mmol/L 26 28 30   Calcium 8.6 - 10.4 mg/dL 9.1 9.4 9.5  Total Protein 6.1 - 8.1 g/dL 6.9 7.1 7.2  Total Bilirubin 0.2 - 1.2 mg/dL 0.3 0.2 0.3  Alkaline Phos 33 - 130 U/L 79 92 77  AST 10 - 35 U/L 19 18 23   ALT 6 - 29 U/L 16 16 17     Imaging: Xr Ankle 2 Views Left  Result Date: 04/07/2017 Spurring at tibiotalar joint was noted. No significant narrowing was noted. Mild subtalar joint space narrowing was noted. Inferior and posterior calcaneal spurs were noted.  Xr Ankle 2 Views Right  Result Date: 04/07/2017 No significant tibiotalar joint space narrowing was noted. Some spurring was noted. Inferior and posterior calcaneal spurs were noted. Mild subtalar joint space narrowing was noted.  Xr Foot 2 Views Left  Result Date: 04/07/2017 First MTP narrowing and subluxation noted. All PIP/DIP narrowing was noted.  Second MTP erosive change was noted. Second MTP subluxation was noted.. No intertarsal joint space narrowing was noted. A small calcaneal spur was noted. No subtalar joint space narrowing was noted. Impression: These findings are consistent with osteoarthritis and  psoriatic arthritis.  Xr Foot 2 Views Right  Result Date: 04/07/2017 First MTP narrowing and subluxation noted. All PIP/DIP narrowing was noted. No erosive changes were noted. No intertarsal joint space narrowing was noted. A small calcaneal spur was noted. No subtalar joint space narrowing was noted. Impression: These findings are consistent with osteoarthritis.  Xr Hand 2 View Left  Result Date: 04/07/2017 No MCP joint narrowing was noted. No intercarpal or radiocarpal joint space narrowing was noted. No erosive changes were noted. PIP/DIP narrowing was noted. Impression: These findings are consistent with osteoarthritis.  Xr Hand 2 View Right  Result Date: 04/07/2017 No MCP joint narrowing was noted. No intercarpal or radiocarpal joint space narrowing was noted. No erosive changes were noted. PIP/DIP narrowing was noted. Impression: These findings are consistent with osteoarthritis.   Speciality Comments: No specialty comments available.    Procedures:  No procedures performed Allergies: No known allergies   Assessment / Plan:     Visit Diagnoses: Psoriatic arthritis [facility]): Patient clinically appears to be doing quite well. She had a flare coming off methotrexate and Enbrel. Now her symptoms are improving. She had no synovitis on examination although she complains of intermittent pain. She has persistent swelling of her right ankle joints which causes discomfort per patient.  Psoriasis: She has some dry patches but no active psoriasis.  High risk medication use - Methotrexate 0.7 mL subcutaneous every week, folic acid 2 mg by mouth daily and Enbrel 50 mg subcutaneous every week - Plan: COMPLETE METABOLIC PANEL WITH GFR, CBC with Differential/Platelet today and then every 3 months to monitor for drug toxicity.  Pain in both hands - Plan: XR Hand 2 View Right, XR Hand 2 View Left  Pain in both feet - Plan: XR Foot 2 Views Right, XR Foot 2 Views Left  Chronic pain of both ankles  - Plan: XR Ankle 2 Views Right, XR Ankle 2 Views Left  Primary osteoarthritis of both hands: chronic pain  Primary osteoarthritis of both knees: She is doing better.  History of vitamin D deficiency - On supplement  History of obesity: Weight loss diet and exercise was discussed.  Other medical problems are listed as follows:  History of gastroesophageal reflux (GERD)  History of iron deficiency anemia  History of epilepsy    Orders: Orders Placed This Encounter  Procedures  . XR Hand 2 View Right  . XR Hand 2 View Left  . XR Foot 2 Views Right  . XR Foot 2 Views Left  . XR Ankle 2 Views Right  .  XR Ankle 2 Views Left   No orders of the defined types were placed in this encounter.   Face-to-face time spent with patient was 30 minutes. Greater than 50% of time was spent in counseling and coordination of care.  Follow-Up Instructions: Return for Psoriatic arthritis, Osteoarthritis.   Bo Merino, MD  Note - This record has been created using Editor, commissioning.  Chart creation errors have been sought, but may not always  have been located. Such creation errors do not reflect on  the standard of medical care.

## 2017-04-07 ENCOUNTER — Ambulatory Visit (INDEPENDENT_AMBULATORY_CARE_PROVIDER_SITE_OTHER): Payer: Self-pay

## 2017-04-07 ENCOUNTER — Ambulatory Visit (INDEPENDENT_AMBULATORY_CARE_PROVIDER_SITE_OTHER): Payer: BLUE CROSS/BLUE SHIELD | Admitting: Rheumatology

## 2017-04-07 ENCOUNTER — Encounter: Payer: Self-pay | Admitting: Rheumatology

## 2017-04-07 ENCOUNTER — Ambulatory Visit (INDEPENDENT_AMBULATORY_CARE_PROVIDER_SITE_OTHER): Payer: BLUE CROSS/BLUE SHIELD

## 2017-04-07 VITALS — BP 130/74 | HR 51 | Resp 16 | Ht 67.0 in | Wt 226.0 lb

## 2017-04-07 DIAGNOSIS — M170 Bilateral primary osteoarthritis of knee: Secondary | ICD-10-CM

## 2017-04-07 DIAGNOSIS — G8929 Other chronic pain: Secondary | ICD-10-CM

## 2017-04-07 DIAGNOSIS — Z79899 Other long term (current) drug therapy: Secondary | ICD-10-CM

## 2017-04-07 DIAGNOSIS — M25571 Pain in right ankle and joints of right foot: Secondary | ICD-10-CM

## 2017-04-07 DIAGNOSIS — M79641 Pain in right hand: Secondary | ICD-10-CM

## 2017-04-07 DIAGNOSIS — M19041 Primary osteoarthritis, right hand: Secondary | ICD-10-CM | POA: Diagnosis not present

## 2017-04-07 DIAGNOSIS — M79671 Pain in right foot: Secondary | ICD-10-CM

## 2017-04-07 DIAGNOSIS — L4050 Arthropathic psoriasis, unspecified: Secondary | ICD-10-CM | POA: Diagnosis not present

## 2017-04-07 DIAGNOSIS — L409 Psoriasis, unspecified: Secondary | ICD-10-CM | POA: Diagnosis not present

## 2017-04-07 DIAGNOSIS — M79672 Pain in left foot: Secondary | ICD-10-CM

## 2017-04-07 DIAGNOSIS — Z8669 Personal history of other diseases of the nervous system and sense organs: Secondary | ICD-10-CM

## 2017-04-07 DIAGNOSIS — Z8639 Personal history of other endocrine, nutritional and metabolic disease: Secondary | ICD-10-CM

## 2017-04-07 DIAGNOSIS — M19042 Primary osteoarthritis, left hand: Secondary | ICD-10-CM

## 2017-04-07 DIAGNOSIS — Z8719 Personal history of other diseases of the digestive system: Secondary | ICD-10-CM

## 2017-04-07 DIAGNOSIS — M25572 Pain in left ankle and joints of left foot: Secondary | ICD-10-CM | POA: Diagnosis not present

## 2017-04-07 DIAGNOSIS — M79642 Pain in left hand: Secondary | ICD-10-CM

## 2017-04-07 DIAGNOSIS — Z862 Personal history of diseases of the blood and blood-forming organs and certain disorders involving the immune mechanism: Secondary | ICD-10-CM | POA: Diagnosis not present

## 2017-04-07 LAB — COMPLETE METABOLIC PANEL WITH GFR
AG RATIO: 1.3 (calc) (ref 1.0–2.5)
ALBUMIN MSPROF: 3.9 g/dL (ref 3.6–5.1)
ALT: 13 U/L (ref 6–29)
AST: 17 U/L (ref 10–35)
Alkaline phosphatase (APISO): 90 U/L (ref 33–130)
BUN: 16 mg/dL (ref 7–25)
CALCIUM: 9.2 mg/dL (ref 8.6–10.4)
CO2: 29 mmol/L (ref 20–32)
CREATININE: 0.71 mg/dL (ref 0.50–0.99)
Chloride: 105 mmol/L (ref 98–110)
GFR, EST NON AFRICAN AMERICAN: 92 mL/min/{1.73_m2} (ref >=60)
GFR, Est African American: 107 mL/min/{1.73_m2} (ref >=60)
GLOBULIN: 2.9 g/dL (ref 1.9–3.7)
Glucose, Bld: 81 mg/dL (ref 65–99)
POTASSIUM: 5.1 mmol/L (ref 3.5–5.3)
SODIUM: 140 mmol/L (ref 135–146)
Total Bilirubin: 0.3 mg/dL (ref 0.2–1.2)
Total Protein: 6.8 g/dL (ref 6.1–8.1)

## 2017-04-07 LAB — CBC WITH DIFFERENTIAL/PLATELET
BASOS ABS: 32 {cells}/uL (ref 0–200)
BASOS PCT: 0.6 %
Eosinophils Absolute: 373 cells/uL (ref 15–500)
Eosinophils Relative: 6.9 %
HCT: 33.1 % — ABNORMAL LOW (ref 35.0–45.0)
Hemoglobin: 11.5 g/dL — ABNORMAL LOW (ref 11.7–15.5)
Lymphs Abs: 2311 cells/uL (ref 850–3900)
MCH: 32 pg (ref 27.0–33.0)
MCHC: 34.7 g/dL (ref 32.0–36.0)
MCV: 92.2 fL (ref 80.0–100.0)
MPV: 9.9 fL (ref 7.5–12.5)
Monocytes Relative: 11.8 %
NEUTROS PCT: 37.9 %
Neutro Abs: 2047 cells/uL (ref 1500–7800)
PLATELETS: 251 10*3/uL (ref 140–400)
RBC: 3.59 10*6/uL — ABNORMAL LOW (ref 3.80–5.10)
RDW: 13 % (ref 11.0–15.0)
TOTAL LYMPHOCYTE: 42.8 %
WBC: 5.4 10*3/uL (ref 3.8–10.8)
WBCMIX: 637 {cells}/uL (ref 200–950)

## 2017-04-07 NOTE — Patient Instructions (Signed)
Standing Labs We placed an order today for your standing lab work.    Please come back and get your standing labs in February and every 3 months  We have open lab Monday through Friday from 8:30-11:30 AM and 1:30-4 PM at the office of Dr. Alaria Oconnor.   The office is located at [address], [address], [place], [place] [address] No appointment is necessary.   Labs are drawn by [facility].  You may receive a bill from [facility] for your lab work. If you have any questions regarding directions or hours of operation,  please call [phone].    

## 2017-04-08 ENCOUNTER — Telehealth: Payer: Self-pay

## 2017-04-08 NOTE — Telephone Encounter (Signed)
Patient was calling back concerning lab results.  Cb# is [phone].  Thank You.

## 2017-04-08 NOTE — Progress Notes (Signed)
Labs are stable. Anemia persist. Please fax results to her PCP

## 2017-04-08 NOTE — Telephone Encounter (Signed)
Patient advised of lab results and verbalized understanding.  

## 2017-04-16 ENCOUNTER — Encounter: Payer: Self-pay | Admitting: Family Medicine

## 2017-04-30 ENCOUNTER — Other Ambulatory Visit: Payer: Self-pay | Admitting: Rheumatology

## 2017-04-30 NOTE — Telephone Encounter (Signed)
Last Visit: 04/07/17 Next Visit: 10/06/17 Labs: 04/07/17 stable TB Gold: 01/13/17 Neg  Okay to refill per Dr. Estanislado Pandy

## 2017-05-02 ENCOUNTER — Other Ambulatory Visit: Payer: Self-pay | Admitting: Family Medicine

## 2017-05-02 DIAGNOSIS — I10 Essential (primary) hypertension: Secondary | ICD-10-CM

## 2017-05-05 NOTE — Telephone Encounter (Signed)
LM on VCM due for appt

## 2017-05-23 ENCOUNTER — Telehealth: Payer: Self-pay

## 2017-05-23 NOTE — Telephone Encounter (Signed)
Patient advised prescription was refilled with a 90 day supply on 04/30/17. Patient to contact [facility] to see when it will be shipped. Patient to call the office if she needs a sample.

## 2017-05-23 NOTE — Telephone Encounter (Signed)
Patient would like a Rx refill on Enbrel Sure[facility] sent to [facility].  Cb# [phone].  Please advise.  Thank you.

## 2017-06-16 ENCOUNTER — Encounter: Payer: Self-pay | Admitting: Family Medicine

## 2017-06-16 ENCOUNTER — Ambulatory Visit (INDEPENDENT_AMBULATORY_CARE_PROVIDER_SITE_OTHER): Payer: BLUE CROSS/BLUE SHIELD | Admitting: Family Medicine

## 2017-06-16 VITALS — BP 112/60 | HR 57 | Resp 16 | Wt 220.6 lb

## 2017-06-16 DIAGNOSIS — M2142 Flat foot [pes planus] (acquired), left foot: Secondary | ICD-10-CM

## 2017-06-16 DIAGNOSIS — M2141 Flat foot [pes planus] (acquired), right foot: Secondary | ICD-10-CM | POA: Diagnosis not present

## 2017-06-16 DIAGNOSIS — E669 Obesity, unspecified: Secondary | ICD-10-CM | POA: Diagnosis not present

## 2017-06-16 DIAGNOSIS — L4050 Arthropathic psoriasis, unspecified: Secondary | ICD-10-CM

## 2017-06-16 NOTE — Patient Instructions (Signed)
20 minutes of something physical every day Cut back on carbohydrates and the easiest way to remember that is white food.  Bread, rice, pasta, potatoes, sugar. Get more fruits and vegetables in your diet

## 2017-06-16 NOTE — Progress Notes (Signed)
   Subjective:    Patient ID: Kimberly Butler, female    DOB: May 04, 1956, 62 y.o.   MRN: [id number]  HPI She is here for consult concerning continued difficulty with foot pain.  She is using orthotics in her tennis shoes and having difficulty with getting him accepted at work.  She does have underlying psoriasis as well as psoriatic arthritis.  She was seen in late October by rheumatology and x-rays were taken of the feet as well as the hands. At the end of the encounter we discussed weight and weight management especially in regard to the trouble she is having with her feet.  Review of Systems     Objective:   Physical Exam Alert and in no distress.  Exam of the right ankle shows good motion with no tenderness to palpation however she does have flat feet.  X-rays were reviewed with her of both left and right feet.       Assessment & Plan:  Pes planus of both feet  Obesity (BMI 30-39.9) - Plan: Amb ref to Medical Nutrition Therapy-MNT  Psoriatic arthritis ([place]) I discussed the diagnosis of the psoriatic arthritis as well as her flat feet.  She is now using arch supports in her tennis shoes but she also recently bought a pair of [employer]  that she found quite helpful.  I also mentioned the possibility of using [employer] shoes .  If need be we can refer to podiatry for proper fitting orthotics. I then discussed diet and exercise with her.  Her diet seems to be quite minimal and she could in fact be starving herself which interferes with her weight loss.  Discussed cutting back on carbohydrates as well as 20 minutes of exercise daily.  Will refer to nutrition to make sure she is adequately supplying all of her needs. Over 25 minutes, greater than 50% spent in counseling and coordination of care.

## 2017-07-07 ENCOUNTER — Encounter: Payer: BLUE CROSS/BLUE SHIELD | Attending: Family Medicine | Admitting: Dietician

## 2017-07-07 ENCOUNTER — Encounter: Payer: Self-pay | Admitting: Dietician

## 2017-07-07 DIAGNOSIS — E669 Obesity, unspecified: Secondary | ICD-10-CM | POA: Insufficient documentation

## 2017-07-07 DIAGNOSIS — Z6834 Body mass index (BMI) 34.0-34.9, adult: Secondary | ICD-10-CM | POA: Diagnosis not present

## 2017-07-07 NOTE — Progress Notes (Signed)
  Medical Nutrition Therapy:  Appt start time: 1030 end time:  1130.   Assessment:  Primary concerns today: Patient is here today alone.  She would like to lose weight.  History includes psyoriasis, psoriatic arthritis, GERD, seizure d/o, GERD, and skin cancer.  She had recent gallbladder surgery.   Weight hx: 218 lbs decreased 2 lbs in the past 3 weeks. 240 lbs highest adult weight.  Lost with walking and with gallbladder problems. 200 lbs lowest adult weight   Patient lives with her husband.  Patient does most of the cooking and share shopping.  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 She does not have time to eat lunch some days.  She sees sweets as a big downfall.  She was skipping a lot of meals prior to seeing her MD.  Preferred Learning Style:   No preference indicated   Learning Readiness:   Ready  Change in progress   MEDICATIONS: dilantin (since age 62), embryl, methotrexate   DIETARY INTAKE: Avoided foods include beef.   Pork seldom, fried food infrequently.  Doesn't tolerate oats.       Does not tolerate fried foods, milk and will vomit these.  May also be vomiting because she is eating too much. 24-hr recall:  B ( AM): 1 fresh mango OR bran cereal, almond milk Snk ( AM): none  L ( PM): sometimes skips OR a frozen noodle and vegetable dish OR yogurt, apple, fig newton, coffee Snk ( PM): none D (8:30 PM): grilled cheese Snk ( PM): sweets (cookies, ice cream, juice) Beverages: 5 cups coffee with cream, 16 ounces water, juice  Usual physical activity: walks all day at work  Estimated energy needs: 1400 calories 158 g carbohydrates 105 g protein 39 g fat  Progress Towards Goal(s):  In progress.   Nutritional Diagnosis:  NB-1.1 Food and nutrition-related knowledge deficit As related to balance of protein, carbohydrates, fats.  As evidenced by diet hx.    Intervention:  Nutrition education related to mindful eating and making healthy meal choices.  Patient  does not eat a lot of meat and discussed meatless menu options.  Discussed protein with each meals.  Discussed benefits of exercise.  Discussed option of meal prepping prior to late days at work.  Drink more water. Continue to stay active. Balance your meals with a little protein with each (beans, hummus, cheese, tuna, chicken, nuts, almond butter, egg)  1 Slice [employer] toast with almond butter, fruit  Spanish scrambled eggs in tortilla, fruit Breakfast, lunch, dinner daily  Be mindful   Choices and portion   Eat slowly away from distraction (eat at the table)   Listen to your body and stop when you are satisfied. Increase your vegetable intake. Fruit or veges for a snack would be a good choice if you are hungry. Consider meal prepping.   Teaching Method Utilized:  Visual Auditory Hands on  Handouts given during visit include:  Meal plan card  My plate  Label reading  Barriers to learning/adherence to lifestyle change: none  Demonstrated degree of understanding via:  Teach Back   Monitoring/Evaluation:  Dietary intake, exercise, label reading, and body weight prn.

## 2017-07-07 NOTE — Patient Instructions (Signed)
Drink more water. Continue to stay active. Balance your meals with a little protein with each (beans, hummus, cheese, tuna, chicken, nuts, almond butter, egg)  1 Slice [employer] toast with almond butter, fruit  Spanish scrambled eggs in tortilla, fruit Breakfast, lunch, dinner daily  Be mindful   Choices and portion   Eat slowly away from distraction (eat at the table)   Listen to your body and stop when you are satisfied. Increase your vegetable intake. Fruit or veges for a snack would be a good choice if you are hungry. Consider meal prepping.

## 2017-07-10 ENCOUNTER — Other Ambulatory Visit: Payer: Self-pay | Admitting: *Deleted

## 2017-07-10 MED ORDER — METHOTREXATE SODIUM CHEMO INJECTION 50 MG/2ML: 17.5000 mg | INTRAMUSCULAR | 0 refills | Status: DC

## 2017-07-10 NOTE — Telephone Encounter (Signed)
Refill request received via fax  Last Visit: 04/07/17 Next Visit: 10/06/17 Labs: 04/07/17 stable  Left message to advise patient she is due to update labs.   Okay to refill 30 day supply per Dr. Estanislado Pandy

## 2017-07-14 ENCOUNTER — Telehealth: Payer: Self-pay | Admitting: Family Medicine

## 2017-07-14 NOTE — Telephone Encounter (Signed)
[facility] 90 [facility] supply of Losartan HCTZ  50/12.5 She scheduled cpe for 09/04/17

## 2017-07-16 ENCOUNTER — Other Ambulatory Visit (INDEPENDENT_AMBULATORY_CARE_PROVIDER_SITE_OTHER): Payer: Self-pay | Admitting: *Deleted

## 2017-07-16 ENCOUNTER — Other Ambulatory Visit: Payer: Self-pay | Admitting: Family Medicine

## 2017-07-16 DIAGNOSIS — R5382 Chronic fatigue, unspecified: Secondary | ICD-10-CM

## 2017-07-16 DIAGNOSIS — M2550 Pain in unspecified joint: Secondary | ICD-10-CM

## 2017-07-16 DIAGNOSIS — I10 Essential (primary) hypertension: Secondary | ICD-10-CM

## 2017-07-16 LAB — CBC WITH DIFFERENTIAL/PLATELET
Basophils Absolute: 28 cells/uL (ref 0–200)
Basophils Relative: 0.3 %
EOS ABS: 221 {cells}/uL (ref 15–500)
Eosinophils Relative: 2.4 %
HCT: 34.5 % — ABNORMAL LOW (ref 35.0–45.0)
Hemoglobin: 11.7 g/dL (ref 11.7–15.5)
Lymphs Abs: 2392 cells/uL (ref 850–3900)
MCH: 31.7 pg (ref 27.0–33.0)
MCHC: 33.9 g/dL (ref 32.0–36.0)
MCV: 93.5 fL (ref 80.0–100.0)
MONOS PCT: 8.8 %
MPV: 10.1 fL (ref 7.5–12.5)
NEUTROS PCT: 62.5 %
Neutro Abs: 5750 cells/uL (ref 1500–7800)
PLATELETS: 238 10*3/uL (ref 140–400)
RBC: 3.69 10*6/uL — AB (ref 3.80–5.10)
RDW: 12.8 % (ref 11.0–15.0)
Total Lymphocyte: 26 %
WBC mixed population: 810 cells/uL (ref 200–950)
WBC: 9.2 10*3/uL (ref 3.8–10.8)

## 2017-07-16 LAB — COMPLETE METABOLIC PANEL WITH GFR
AG RATIO: 1.3 (calc) (ref 1.0–2.5)
ALKALINE PHOSPHATASE (APISO): 87 U/L (ref 33–130)
ALT: 14 U/L (ref 6–29)
AST: 19 U/L (ref 10–35)
Albumin: 3.9 g/dL (ref 3.6–5.1)
BILIRUBIN TOTAL: 0.2 mg/dL (ref 0.2–1.2)
BUN: 15 mg/dL (ref 7–25)
CO2: 31 mmol/L (ref 20–32)
Calcium: 9.1 mg/dL (ref 8.6–10.4)
Chloride: 104 mmol/L (ref 98–110)
Creat: 0.58 mg/dL (ref 0.50–0.99)
GFR, Est African American: 115 mL/min/{1.73_m2} (ref >=60)
GFR, Est Non African American: 99 mL/min/{1.73_m2} (ref >=60)
Globulin: 3 g/dL (calc) (ref 1.9–3.7)
Glucose, Bld: 74 mg/dL (ref 65–99)
POTASSIUM: 4.6 mmol/L (ref 3.5–5.3)
SODIUM: 140 mmol/L (ref 135–146)
Total Protein: 6.9 g/dL (ref 6.1–8.1)

## 2017-07-16 MED ORDER — LOSARTAN POTASSIUM-HCTZ 50-12.5 MG PO TABS: ORAL_TABLET | ORAL | 0 refills | Status: DC

## 2017-07-17 NOTE — Progress Notes (Signed)
Anemia is improving.  We will continue to monitor. All other labs are WNL.

## 2017-07-29 ENCOUNTER — Other Ambulatory Visit: Payer: Self-pay | Admitting: Rheumatology

## 2017-07-29 NOTE — Telephone Encounter (Signed)
Last Visit: 04/07/17 Next Visit: 10/06/17 Labs: 07/16/17 Anemia is improving. We will continue to monitor. All other labs are WNL. TB Gold: 01/13/17 Neg   Okay to refill per Dr. Estanislado Pandy

## 2017-09-04 ENCOUNTER — Encounter: Payer: Self-pay | Admitting: Family Medicine

## 2017-09-04 ENCOUNTER — Ambulatory Visit (INDEPENDENT_AMBULATORY_CARE_PROVIDER_SITE_OTHER): Payer: BLUE CROSS/BLUE SHIELD | Admitting: Family Medicine

## 2017-09-04 VITALS — BP 110/76 | HR 53 | Temp 97.5°F | Ht 65.0 in | Wt 216.8 lb

## 2017-09-04 DIAGNOSIS — L409 Psoriasis, unspecified: Secondary | ICD-10-CM | POA: Diagnosis not present

## 2017-09-04 DIAGNOSIS — E669 Obesity, unspecified: Secondary | ICD-10-CM | POA: Diagnosis not present

## 2017-09-04 DIAGNOSIS — E739 Lactose intolerance, unspecified: Secondary | ICD-10-CM

## 2017-09-04 DIAGNOSIS — L4050 Arthropathic psoriasis, unspecified: Secondary | ICD-10-CM | POA: Diagnosis not present

## 2017-09-04 DIAGNOSIS — Z1159 Encounter for screening for other viral diseases: Secondary | ICD-10-CM | POA: Diagnosis not present

## 2017-09-04 DIAGNOSIS — I10 Essential (primary) hypertension: Secondary | ICD-10-CM

## 2017-09-04 DIAGNOSIS — Z79899 Other long term (current) drug therapy: Secondary | ICD-10-CM | POA: Diagnosis not present

## 2017-09-04 DIAGNOSIS — Z8639 Personal history of other endocrine, nutritional and metabolic disease: Secondary | ICD-10-CM

## 2017-09-04 DIAGNOSIS — G40909 Epilepsy, unspecified, not intractable, without status epilepticus: Secondary | ICD-10-CM | POA: Diagnosis not present

## 2017-09-04 DIAGNOSIS — J301 Allergic rhinitis due to pollen: Secondary | ICD-10-CM | POA: Diagnosis not present

## 2017-09-04 DIAGNOSIS — K219 Gastro-esophageal reflux disease without esophagitis: Secondary | ICD-10-CM

## 2017-09-04 DIAGNOSIS — Z0000 Encounter for general adult medical examination without abnormal findings: Secondary | ICD-10-CM

## 2017-09-04 LAB — POCT URINALYSIS DIP (PROADVANTAGE DEVICE)
Bilirubin, UA: NEGATIVE
Blood, UA: NEGATIVE
Glucose, UA: NEGATIVE mg/dL
LEUKOCYTES UA: NEGATIVE
NITRITE UA: NEGATIVE
PH UA: 6 (ref 5.0–8.0)
Specific Gravity, Urine: 1.03
Urobilinogen, Ur: 3.5

## 2017-09-04 MED ORDER — LOSARTAN POTASSIUM-HCTZ 50-12.5 MG PO TABS: ORAL_TABLET | ORAL | 3 refills | Status: DC

## 2017-09-04 MED ORDER — OMEPRAZOLE 40 MG PO CPDR: 40.0000 mg | DELAYED_RELEASE_CAPSULE | Freq: Every day | ORAL | 3 refills | Status: DC

## 2017-09-04 NOTE — Patient Instructions (Signed)
If you want to try Lactaid to see if it will help with the intolerance

## 2017-09-04 NOTE — Progress Notes (Signed)
Subjective:    Patient ID: Kimberly Butler, female    DOB: 03-03-1956, 62 y.o.   MRN: [id number]  HPI She is here for complete examination.  She does have hypertension and presently is on losartan/HCTZ and doing well.  She also has underlying psoriatic arthritis and is being followed by rheumatology.  She continues on methotrexate as well as Enbrel.  She also is on multiple supplements.  She seem to be doing well on them.  She does have underlying seizure disorder and is doing well on her Dilantin.  She continues on Prilosec.  She has lost a little bit of weight and does feel good about this.  She also has a histor vitamin D deficiency.  She also has glucose intolerance which is apparently not causing her too much difficulty.  She usually avoids stuff.  Otherwise she has no particular concerns or complaints.  Her family and social history as well as health maintenance and immunizations was reviewed   Review of Systems  All other systems reviewed and are negative.      Objective:   Physical Exam BP 110/76 (BP Location: Left Arm, Patient Position: Sitting)   Pulse (!) 53   Temp (!) 97.5 F (36.4 C)   Ht 5\' 5"  (1.651 m)   Wt 216 lb 12.8 oz (98.3 kg)   SpO2 99%   BMI 36.08 kg/m   General Appearance:    Alert, cooperative, no distress, appears stated age  Head:    Normocephalic, without obvious abnormality, atraumatic  Eyes:    PERRL, conjunctiva/corneas clear, EOM's intact, fundi    benign  Ears:    Normal TM's and external ear canals  Nose:   Nares normal, mucosa normal, no drainage or sinus   tenderness  Throat:   Lips, mucosa, and tongue normal; teeth and gums normal  Neck:   Supple, no lymphadenopathy;  thyroid:  no   enlargement/tenderness/nodules; no carotid   bruit or JVD     Lungs:     Clear to auscultation bilaterally without wheezes, rales or     ronchi; respirations unlabored      Heart:    Regular rate and rhythm, S1 and S2 normal, no murmur, rub   or gallop  Breast  Exam:    Deferred to GYN  Abdomen:     Soft, non-tender, nondistended, normoactive bowel sounds,    no masses, no hepatosplenomegaly  Genitalia:    Deferred to GYN     Extremities:   No clubbing, cyanosis or edema  Pulses:   2+ and symmetric all extremities  Skin:   Skin color, texture, turgor normal, no rashes or lesions  Lymph nodes:   Cervical, supraclavicular, and axillary nodes normal  Neurologic:   CNII-XII intact, normal strength, sensation and gait; reflexes 2+ and symmetric throughout          Psych:   Normal mood, affect, hygiene and grooming.         Assessment & Plan:  Routine general medical examination at a health care facility - Plan: CBC with Differential/Platelet, Comprehensive metabolic panel, Lipid panel, POCT Urinalysis DIP (Proadvantage Device)  Essential hypertension - Plan: CBC with Differential/Platelet, Comprehensive metabolic panel, losartan-hydrochlorothiazide (HYZAAR) 50-12.5 MG tablet, DISCONTINUED: losartan-hydrochlorothiazide (HYZAAR) 50-12.5 MG tablet  Obesity (BMI 30-39.9)  Psoriatic arthritis ([place])  Seizure disorder ([place]) - Plan: Phenytoin level, free  Lactose intolerance in adult  Psoriasis  History of vitamin D deficiency - Plan: VITAMIN D 25 Hydroxy (Vit-D Deficiency, Fractures)  Gastroesophageal  reflux disease without esophagitis - Plan: omeprazole (PRILOSEC) 40 MG capsule  Allergic rhinitis due to pollen, unspecified seasonality  High risk medication use - Plan: CBC with Differential/Platelet, Comprehensive metabolic panel, Lipid panel  Need for hepatitis C screening test - Plan: Hepatitis C antibody I will continue her on her present medication regimen she seems to be doing well on this.  Encouraged her to try [place] to help with her glucose intolerance.  Encouraged her to continue with her physical activities.  Discussedctaid general medical care with her.  She will continue be followed by her gynecologist.

## 2017-09-08 ENCOUNTER — Telehealth: Payer: Self-pay | Admitting: Family Medicine

## 2017-09-08 ENCOUNTER — Other Ambulatory Visit: Payer: Self-pay

## 2017-09-08 DIAGNOSIS — I10 Essential (primary) hypertension: Secondary | ICD-10-CM

## 2017-09-08 LAB — CBC WITH DIFFERENTIAL/PLATELET
Basophils Absolute: 0 10*3/uL (ref 0.0–0.2)
Basos: 0 %
EOS (ABSOLUTE): 0.2 10*3/uL (ref 0.0–0.4)
Eos: 5 %
HEMATOCRIT: 36.9 % (ref 34.0–46.6)
Hemoglobin: 12.2 g/dL (ref 11.1–15.9)
IMMATURE GRANULOCYTES: 0 %
Immature Grans (Abs): 0 10*3/uL (ref 0.0–0.1)
LYMPHS ABS: 1.9 10*3/uL (ref 0.7–3.1)
Lymphs: 39 %
MCH: 31.7 pg (ref 26.6–33.0)
MCHC: 33.1 g/dL (ref 31.5–35.7)
MCV: 96 fL (ref 79–97)
MONOS ABS: 0.4 10*3/uL (ref 0.1–0.9)
Monocytes: 9 %
NEUTROS PCT: 47 %
Neutrophils Absolute: 2.2 10*3/uL (ref 1.4–7.0)
Platelets: 269 10*3/uL (ref 150–379)
RBC: 3.85 x10E6/uL (ref 3.77–5.28)
RDW: 14.1 % (ref 12.3–15.4)
WBC: 4.8 10*3/uL (ref 3.4–10.8)

## 2017-09-08 LAB — HEPATITIS C ANTIBODY: Hep C Virus Ab: 0.3 s/co ratio (ref 0.0–0.9)

## 2017-09-08 LAB — COMPREHENSIVE METABOLIC PANEL
ALK PHOS: 101 IU/L (ref 39–117)
ALT: 17 IU/L (ref 0–32)
AST: 21 IU/L (ref 0–40)
Albumin/Globulin Ratio: 1.4 (ref 1.2–2.2)
Albumin: 4.3 g/dL (ref 3.6–4.8)
BUN/Creatinine Ratio: 21 (ref 12–28)
BUN: 14 mg/dL (ref 8–27)
Bilirubin Total: <0.2 mg/dL (ref 0.0–1.2)
CALCIUM: 9.5 mg/dL (ref 8.7–10.3)
CO2: 25 mmol/L (ref 20–29)
CREATININE: 0.68 mg/dL (ref 0.57–1.00)
Chloride: 105 mmol/L (ref 96–106)
GFR calc Af Amer: 108 mL/min/{1.73_m2} (ref >59)
GFR calc non Af Amer: 94 mL/min/{1.73_m2} (ref >59)
GLOBULIN, TOTAL: 3 g/dL (ref 1.5–4.5)
Glucose: 89 mg/dL (ref 65–99)
POTASSIUM: 4.3 mmol/L (ref 3.5–5.2)
SODIUM: 142 mmol/L (ref 134–144)
Total Protein: 7.3 g/dL (ref 6.0–8.5)

## 2017-09-08 LAB — PHENYTOIN LEVEL, FREE: PHENYTOIN FREE: NOT DETECTED ug/mL (ref 1.0–2.0)

## 2017-09-08 LAB — LIPID PANEL
CHOLESTEROL TOTAL: 190 mg/dL (ref 100–199)
Chol/HDL Ratio: 2.2 ratio (ref 0.0–4.4)
HDL: 88 mg/dL (ref >39)
LDL CALC: 83 mg/dL (ref 0–99)
Triglycerides: 96 mg/dL (ref 0–149)
VLDL Cholesterol Cal: 19 mg/dL (ref 5–40)

## 2017-09-08 LAB — VITAMIN D 25 HYDROXY (VIT D DEFICIENCY, FRACTURES): VIT D 25 HYDROXY: 35.2 ng/mL (ref 30.0–100.0)

## 2017-09-08 MED ORDER — LOSARTAN POTASSIUM-HCTZ 50-12.5 MG PO TABS: ORAL_TABLET | ORAL | 3 refills | Status: DC

## 2017-09-08 NOTE — Telephone Encounter (Signed)
New Message  Pt verbalized the losartan was suppose to go to [facility] and not [facility].  Please f/u with pt

## 2017-09-08 NOTE — Telephone Encounter (Signed)
Called pt to let her know that [facility]. No answer LVM. [place]

## 2017-09-22 NOTE — Progress Notes (Deleted)
Office Visit Note  Patient: Kimberly Butler             Date of Birth: 1955/08/28           MRN: [id number]             PCP: Denita Lung, MD Referring: Denita Lung, MD Visit Date: 10/06/2017 Occupation: @GUAROCC @    Subjective:  No chief complaint on file.   History of Present Illness: Kimberly Butler is a 62 y.o. female ***   Activities of Daily Living:  Patient reports morning stiffness for *** {minute/hour:19697}.   Patient {ACTIONS;DENIES/REPORTS:21021675::"Denies"} nocturnal pain.  Difficulty dressing/grooming: {ACTIONS;DENIES/REPORTS:21021675::"Denies"} Difficulty climbing stairs: {ACTIONS;DENIES/REPORTS:21021675::"Denies"} Difficulty getting out of chair: {ACTIONS;DENIES/REPORTS:21021675::"Denies"} Difficulty using hands for taps, buttons, cutlery, and/or writing: {ACTIONS;DENIES/REPORTS:21021675::"Denies"}   No Rheumatology ROS completed.   PMFS History:  Patient Active Problem List   Diagnosis Date Noted  . Lactose intolerance in adult 11/18/2016  . Post-menopausal 08/28/2016  . High risk medication use 08/28/2016  . History of vitamin D deficiency 08/28/2016  . History of gastroesophageal reflux (GERD) 08/28/2016  . History of iron deficiency anemia 08/28/2016  . Psoriasis 07/07/2014  . Seizure disorder ([place]) 01/07/2011  . GERD (gastroesophageal reflux disease) 01/07/2011  . Hypertension 01/07/2011  . Psoriatic arthritis ([place]) 01/07/2011  . Allergic rhinitis due to pollen 01/07/2011  . Obesity (BMI 30-39.9) 01/07/2011    Past Medical History:  Diagnosis Date  . Allergy    RHINITIS  . 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 Epilepsy ([place])    hasn't had seizure in 20 years (01/15/17)  . GERD (gastroesophageal reflux disease)   . Hypertension   . Obesity   . Psoriasis   . Psoriatic arthritis ([place])   . Smoker   . Varicose veins     Family History  Problem Relation Age of Onset  . Cancer Mother   . Heart disease Mother   . Arthritis Father   .  Kidney failure Father   . Dementia Father   . Diabetes Father   . Cancer Sister   . Cancer Brother    Past Surgical History:  Procedure Laterality Date  . CATARACT EXTRACTION W/ INTRAOCULAR LENS IMPLANT Bilateral   . CESAREAN SECTION    . CHOLECYSTECTOMY N/A 01/23/2017   Procedure: LAPAROSCOPIC CHOLECYSTECTOMY;  Surgeon: Georganna Skeans, MD;  Location: [facility];  Service: General;  Laterality: N/A;  . TONSILECTOMY, ADENOIDECTOMY, BILATERAL MYRINGOTOMY AND TUBES    . TUBAL LIGATION     Social History   Social History Narrative  . Not on file     Objective: Vital Signs: There were no vitals taken for this visit.   Physical Exam   Musculoskeletal Exam: ***  CDAI Exam: No CDAI exam completed.    Investigation: No additional findings. CBC Latest Ref Rng & Units 09/04/2017 07/16/2017 04/07/2017  WBC 3.4 - 10.8 x10E3/uL 4.8 9.2 5.4  Hemoglobin 11.1 - 15.9 g/dL 12.2 11.7 11.5(L)  Hematocrit 34.0 - 46.6 % 36.9 34.5(L) 33.1(L)  Platelets 150 - 379 x10E3/uL 269 238 251   CMP Latest Ref Rng & Units 09/04/2017 07/16/2017 04/07/2017  Glucose 65 - 99 mg/dL 89 74 81  BUN 8 - 27 mg/dL 14 15 16   Creatinine 0.57 - 1.00 mg/dL 0.68 0.58 0.71  Sodium 134 - 144 mmol/L 142 140 140  Potassium 3.5 - 5.2 mmol/L 4.3 4.6 5.1  Chloride 96 - 106 mmol/L 105 104 105  CO2 20 - 29 mmol/L 25  31 29  Calcium 8.7 - 10.3 mg/dL 9.5 9.1 9.2  Total Protein 6.0 - 8.5 g/dL 7.3 6.9 6.8  Total Bilirubin 0.0 - 1.2 mg/dL <0.2 0.2 0.3  Alkaline Phos 39 - 117 IU/L 101 - -  AST 0 - 40 IU/L 21 19 17   ALT 0 - 32 IU/L 17 14 13     Imaging: No results found.  Speciality Comments: No specialty comments available.    Procedures:  No procedures performed Allergies: No known allergies   Assessment / Plan:     Visit Diagnoses: Psoriatic arthritis ([place])  Psoriasis  High risk medication use  Primary osteoarthritis of both hands  Primary osteoarthritis of both knees  Vitamin D deficiency  History of  gastroesophageal reflux (GERD)  History of iron deficiency anemia  History of epilepsy    Orders: No orders of the defined types were placed in this encounter.  No orders of the defined types were placed in this encounter.   Face-to-face time spent with patient was *** minutes. 50% of time was spent in counseling and coordination of care.  Follow-Up Instructions: No follow-ups on file.   Ofilia Neas, PA-C  Note - This record has been created using Dragon software.  Chart creation errors have been sought, but may not always  have been located. Such creation errors do not reflect on  the standard of medical care.

## 2017-10-02 NOTE — Progress Notes (Deleted)
Office Visit Note  Patient: Kimberly Butler             Date of Birth: [birth year]           MRN: [id number]             PCP: Denita Lung, MD Referring: Denita Lung, MD Visit Date: 10/09/2017 Occupation: @GUAROCC @    Subjective:  No chief complaint on file.   History of Present Illness: Kimberly Butler is a 62 y.o. female ***   Activities of Daily Living:  Patient reports morning stiffness for *** {minute/hour:19697}.   Patient {ACTIONS;DENIES/REPORTS:21021675::"Denies"} nocturnal pain.  Difficulty dressing/grooming: {ACTIONS;DENIES/REPORTS:21021675::"Denies"} Difficulty climbing stairs: {ACTIONS;DENIES/REPORTS:21021675::"Denies"} Difficulty getting out of chair: {ACTIONS;DENIES/REPORTS:21021675::"Denies"} Difficulty using hands for taps, buttons, cutlery, and/or writing: {ACTIONS;DENIES/REPORTS:21021675::"Denies"}   No Rheumatology ROS completed.   PMFS History:  Patient Active Problem List   Diagnosis Date Noted  . Lactose intolerance in adult 11/18/2016  . Post-menopausal 08/28/2016  . High risk medication use 08/28/2016  . History of vitamin D deficiency 08/28/2016  . History of gastroesophageal reflux (GERD) 08/28/2016  . History of iron deficiency anemia 08/28/2016  . Psoriasis 07/07/2014  . Seizure disorder ([place]) 01/07/2011  . GERD (gastroesophageal reflux disease) 01/07/2011  . Hypertension 01/07/2011  . Psoriatic arthritis ([place]) 01/07/2011  . Allergic rhinitis due to pollen 01/07/2011  . Obesity (BMI 30-39.9) 01/07/2011    Past Medical History:  Diagnosis Date  . Allergy    RHINITIS  . 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 Epilepsy ([place])    hasn't had seizure in 20 years (01/15/17)  . GERD (gastroesophageal reflux disease)   . Hypertension   . Obesity   . Psoriasis   . Psoriatic arthritis ([place])   . Smoker   . Varicose veins     Family History  Problem Relation Age of Onset  . Cancer Mother   . Heart disease Mother   . Arthritis Father   .  Kidney failure Father   . Dementia Father   . Diabetes Father   . Cancer Sister   . Cancer Brother    Past Surgical History:  Procedure Laterality Date  . CATARACT EXTRACTION W/ INTRAOCULAR LENS IMPLANT Bilateral   . CESAREAN SECTION    . CHOLECYSTECTOMY N/A 01/23/2017   Procedure: LAPAROSCOPIC CHOLECYSTECTOMY;  Surgeon: Georganna Skeans, MD;  Location: [facility];  Service: General;  Laterality: N/A;  . TONSILECTOMY, ADENOIDECTOMY, BILATERAL MYRINGOTOMY AND TUBES    . TUBAL LIGATION     Social History   Social History Narrative  . Not on file     Objective: Vital Signs: There were no vitals taken for this visit.   Physical Exam   Musculoskeletal Exam: ***  CDAI Exam: No CDAI exam completed.    Investigation: No additional findings. CBC Latest Ref Rng & Units 09/04/2017 07/16/2017 04/07/2017  WBC 3.4 - 10.8 x10E3/uL 4.8 9.2 5.4  Hemoglobin 11.1 - 15.9 g/dL 12.2 11.7 11.5(L)  Hematocrit 34.0 - 46.6 % 36.9 34.5(L) 33.1(L)  Platelets 150 - 379 x10E3/uL 269 238 251   CMP Latest Ref Rng & Units 09/04/2017 07/16/2017 04/07/2017  Glucose 65 - 99 mg/dL 89 74 81  BUN 8 - 27 mg/dL 14 15 16   Creatinine 0.57 - 1.00 mg/dL 0.68 0.58 0.71  Sodium 134 - 144 mmol/L 142 140 140  Potassium 3.5 - 5.2 mmol/L 4.3 4.6 5.1  Chloride 96 - 106 mmol/L 105 104 105  CO2 20 - 29 mmol/L 25  31 29  Calcium 8.7 - 10.3 mg/dL 9.5 9.1 9.2  Total Protein 6.0 - 8.5 g/dL 7.3 6.9 6.8  Total Bilirubin 0.0 - 1.2 mg/dL <0.2 0.2 0.3  Alkaline Phos 39 - 117 IU/L 101 - -  AST 0 - 40 IU/L 21 19 17   ALT 0 - 32 IU/L 17 14 13     Imaging: No results found.  Speciality Comments: No specialty comments available.    Procedures:  No procedures performed Allergies: No known allergies   Assessment / Plan:     Visit Diagnoses: Psoriatic arthritis ([place])  Psoriasis  High risk medication use - Methotrexate 0.7 mL subcutaneous every week, folic acid 2 mg by mouth daily and Enbrel 50 mg subcutaneous every week TB gold:  8/6/18CBC and CMP: 09/04/17  Primary osteoarthritis of both hands  Primary osteoarthritis of both knees  Vitamin D deficiency  History of gastroesophageal reflux (GERD)  History of iron deficiency anemia  History of epilepsy    Orders: No orders of the defined types were placed in this encounter.  No orders of the defined types were placed in this encounter.   Face-to-face time spent with patient was *** minutes. 50% of time was spent in counseling and coordination of care.  Follow-Up Instructions: No follow-ups on file.   Ofilia Neas, PA-C  Note - This record has been created using Dragon software.  Chart creation errors have been sought, but may not always  have been located. Such creation errors do not reflect on  the standard of medical care.

## 2017-10-03 NOTE — Progress Notes (Signed)
Office Visit Note  Patient: Kimberly Butler             Date of Birth: 1955-12-03           MRN: [id number]             PCP: Denita Lung, MD Referring: Denita Lung, MD Visit Date: 10/09/2017 Occupation: @GUAROCC @    Subjective:  Right ankle pain    History of Present Illness: Kimberly Butler is a 62 y.o. female with history of psoriatic arthritis and osteoarthritis.  Patient states she continues to inject Enbrel once a week and MTX 0.4 ml once weekly.  She reports she had hair loss when she was on MTX 0.7 ml once weekly.  She states that over the past few months she has had increased joint pain and joint swelling in bilateral ankle joints.  She feels that she has instability in her left ankle and it feels as is going to give out occasionally.   she states she has also developed more psoriasis.  She currently has a patch of psoriasis on her left leg and scattered patches.  She denies any plantar fasciitis or Achilles tendinitis.  She denies any SI joint pain.  She denies any pain or swelling in her hands at this time.  She denies any pain or swelling in her bilateral knees.  She states that her feet have bunions bilaterally and overcrowding of her toes.  Denies any swelling or pain in her toes at this time.  She wears orthotics in her shoes.    Activities of Daily Living:  Patient reports morning stiffness for 2-3 minutes.   Patient Reports nocturnal pain.  Difficulty dressing/grooming: Denies Difficulty climbing stairs: Reports Difficulty getting out of chair: Denies Difficulty using hands for taps, buttons, cutlery, and/or writing: Denies   Review of Systems  Constitutional: Negative for fatigue.  HENT: Negative for mouth sores, trouble swallowing, trouble swallowing, mouth dryness and nose dryness.   Eyes: Negative for pain, visual disturbance and dryness.  Respiratory: Negative for cough, hemoptysis, shortness of breath and difficulty breathing.   Cardiovascular: Negative  for chest pain, palpitations, hypertension and swelling in legs/feet.  Gastrointestinal: Positive for constipation. Negative for blood in stool and diarrhea.  Endocrine: Negative for increased urination.  Genitourinary: Negative for painful urination.  Musculoskeletal: Positive for arthralgias, joint pain, joint swelling and morning stiffness. Negative for myalgias, muscle weakness, muscle tenderness and myalgias.  Skin: Positive for rash (Psoriasis). Negative for color change, pallor, hair loss, nodules/bumps, skin tightness, ulcers and sensitivity to sunlight.  Allergic/Immunologic: Negative for susceptible to infections.  Neurological: Negative for dizziness, numbness, headaches and weakness.  Hematological: Negative for swollen glands.  Psychiatric/Behavioral: Negative for depressed mood and sleep disturbance. The patient is not nervous/anxious.     PMFS History:  Patient Active Problem List   Diagnosis Date Noted  . Lactose intolerance in adult 11/18/2016  . Post-menopausal 08/28/2016  . High risk medication use 08/28/2016  . History of vitamin D deficiency 08/28/2016  . History of gastroesophageal reflux (GERD) 08/28/2016  . History of iron deficiency anemia 08/28/2016  . Psoriasis 07/07/2014  . Seizure disorder ([place]) 01/07/2011  . GERD (gastroesophageal reflux disease) 01/07/2011  . Hypertension 01/07/2011  . Psoriatic arthritis ([place]) 01/07/2011  . Allergic rhinitis due to pollen 01/07/2011  . Obesity (BMI 30-39.9) 01/07/2011    Past Medical History:  Diagnosis Date  . Allergy    RHINITIS  . Cancer (HCC)    BASAL CELL  on face  . Epilepsy ([place])    hasn't had seizure in 20 years (01/15/17)  . GERD (gastroesophageal reflux disease)   . Hypertension   . Obesity   . Psoriasis   . Psoriatic arthritis ([place])   . Smoker   . Varicose veins     Family History  Problem Relation Age of Onset  . Cancer Mother   . Heart disease Mother   . Arthritis Father   . Kidney failure  Father   . Dementia Father   . Diabetes Father   . Cancer Sister   . Cancer Brother   . Healthy Son    Past Surgical History:  Procedure Laterality Date  . CATARACT EXTRACTION W/ INTRAOCULAR LENS IMPLANT Bilateral   . CESAREAN SECTION    . CHOLECYSTECTOMY N/A 01/23/2017   Procedure: LAPAROSCOPIC CHOLECYSTECTOMY;  Surgeon: Georganna Skeans, MD;  Location: [facility];  Service: General;  Laterality: N/A;  . TONSILECTOMY, ADENOIDECTOMY, BILATERAL MYRINGOTOMY AND TUBES    . TUBAL LIGATION     Social History   Social History Narrative  . Not on file     Objective: Vital Signs: BP 132/83 (BP Location: Left Arm, Patient Position: Sitting, Cuff Size: Normal)   Pulse (!) 55   Resp 16   Ht 5\' 7"  (1.702 m)   Wt 231 lb (104.8 kg)   BMI 36.18 kg/m    Physical Exam  Constitutional: She is oriented to person, place, and time. She appears well-developed and well-nourished.  HENT:  Head: Normocephalic and atraumatic.  Eyes: Conjunctivae and EOM are normal.  Neck: Normal range of motion.  Cardiovascular: Normal rate, regular rhythm, normal heart sounds and intact distal pulses.  Pulmonary/Chest: Effort normal and breath sounds normal.  Abdominal: Soft. Bowel sounds are normal.  Lymphadenopathy:    She has no cervical adenopathy.  Neurological: She is alert and oriented to person, place, and time.  Skin: Skin is warm and dry. Capillary refill takes less than 2 seconds.  Psychiatric: She has a normal mood and affect. Her behavior is normal.  Nursing note and vitals reviewed.    Musculoskeletal Exam: C-spine good range of motion.  Thoracic kyphosis.  No midline spinal tenderness.  No SI joint tenderness.  Shoulder joints, elbow joints, wrist joints, MCPs, PIPs, DIPs good range of motion with no synovitis or tenderness.  Mild DIP and PIP synovial thickening.  Hip joints, knee joints, ankle joints, MTPs, PIPs, DIPs good range of motion.  No warmth or effusion of bilateral knees.  She is good range  of motion of her knee joints with no discomfort.  She has warmth and effusion of right ankle.  She has discomfort with range of motion of her right ankle.  She has pedal edema bilaterally and is wearing compression stockings.  CDAI Exam: CDAI Homunculus Exam:   Tenderness:  RLE: tibiotalar  Swelling:  RLE: tibiotalar  Joint Counts:  CDAI Tender Joint count: 0 CDAI Swollen Joint count: 0  Global Assessments:  Patient Global Assessment: 8   CDAI Calculated Score: 8    Investigation: No additional findings. CBC Latest Ref Rng & Units 09/04/2017 07/16/2017 04/07/2017  WBC 3.4 - 10.8 x10E3/uL 4.8 9.2 5.4  Hemoglobin 11.1 - 15.9 g/dL 12.2 11.7 11.5(L)  Hematocrit 34.0 - 46.6 % 36.9 34.5(L) 33.1(L)  Platelets 150 - 379 x10E3/uL 269 238 251   CMP Latest Ref Rng & Units 09/04/2017 07/16/2017 04/07/2017  Glucose 65 - 99 mg/dL 89 74 81  BUN 8 - 27 mg/dL  14 15 16   Creatinine 0.57 - 1.00 mg/dL 0.68 0.58 0.71  Sodium 134 - 144 mmol/L 142 140 140  Potassium 3.5 - 5.2 mmol/L 4.3 4.6 5.1  Chloride 96 - 106 mmol/L 105 104 105  CO2 20 - 29 mmol/L 25 31 29   Calcium 8.7 - 10.3 mg/dL 9.5 9.1 9.2  Total Protein 6.0 - 8.5 g/dL 7.3 6.9 6.8  Total Bilirubin 0.0 - 1.2 mg/dL <0.2 0.2 0.3  Alkaline Phos 39 - 117 IU/L 101 - -  AST 0 - 40 IU/L 21 19 17   ALT 0 - 32 IU/L 17 14 13     Imaging: No results found.  Speciality Comments: No specialty comments available.    Procedures:  No procedures performed Allergies: No known allergies   Assessment / Plan:     Visit Diagnoses: Psoriatic arthritis ([place]): She has bilateral ankle tenderness and swelling.  She has no other joint pain at this time.  She has no Achilles tendinitis or plantar fasciitis.  She has no SI joint tenderness.  She is active psoriasis on her left thigh and scattered on her skin.  Developed increased joint pain in her ankles and increased psoriasis since decreasing her dose to 0.4 mL of methotrexate.  We will increase her dose back  up to 0.7 mL once weekly she will continue injecting Enbrel subcutaneously once a week. She declined a cortisone injection in her right ankle today.  She also declined a prednisone taper.  A refill of Enbrel was sent to the pharmacy.    Psoriasis: She has a psoriasis patch on her left thigh and scattered patches on her skin.    High risk medication use - Methotrexate 0.7 mL subcutaneous every week, folic acid 2 mg by mouth daily and Enbrel 50 mg subcutaneous every week TB gold: 01/13/17 CBC and CMP: 09/04/17.  She will be due for labs in June and every 3 months.  Primary osteoarthritis of both hands: She has PIP and DIP synovial thickening consistent with osteoarthritis of the hands.  She has no discomfort in her hands at this time.  Joint protection muscle strengthening were discussed.  Primary osteoarthritis of both knees: No warmth or effusion of bilateral knees.  She has no discomfort in her feet at this time.  Good range of motion of bilateral knees.  Vitamin D deficiency: She takes vitamin D 5000 units daily.  Other medical conditions are listed as follows:  History of gastroesophageal reflux (GERD)  History of iron deficiency anemia  History of epilepsy  Essential hypertension    Orders: No orders of the defined types were placed in this encounter.  No orders of the defined types were placed in this encounter.   Face-to-face time spent with patient was 30 minutes.> 50% of time was spent in counseling and coordination of care.  Follow-Up Instructions: Return in about 5 months (around 03/11/2018) for Psoriatic arthritis, Osteoarthritis.   Ofilia Neas, PA-C  Note - This record has been created using Dragon software.  Chart creation errors have been sought, but may not always  have been located. Such creation errors do not reflect on  the standard of medical care.

## 2017-10-06 ENCOUNTER — Ambulatory Visit: Payer: BLUE CROSS/BLUE SHIELD | Admitting: Physician Assistant

## 2017-10-09 ENCOUNTER — Ambulatory Visit: Payer: BLUE CROSS/BLUE SHIELD | Admitting: Physician Assistant

## 2017-10-09 ENCOUNTER — Encounter: Payer: Self-pay | Admitting: Physician Assistant

## 2017-10-09 ENCOUNTER — Ambulatory Visit (INDEPENDENT_AMBULATORY_CARE_PROVIDER_SITE_OTHER): Payer: BLUE CROSS/BLUE SHIELD | Admitting: Physician Assistant

## 2017-10-09 VITALS — BP 132/83 | HR 55 | Resp 16 | Ht 67.0 in | Wt 231.0 lb

## 2017-10-09 DIAGNOSIS — M170 Bilateral primary osteoarthritis of knee: Secondary | ICD-10-CM

## 2017-10-09 DIAGNOSIS — M19041 Primary osteoarthritis, right hand: Secondary | ICD-10-CM | POA: Diagnosis not present

## 2017-10-09 DIAGNOSIS — L4050 Arthropathic psoriasis, unspecified: Secondary | ICD-10-CM

## 2017-10-09 DIAGNOSIS — Z8669 Personal history of other diseases of the nervous system and sense organs: Secondary | ICD-10-CM | POA: Diagnosis not present

## 2017-10-09 DIAGNOSIS — E559 Vitamin D deficiency, unspecified: Secondary | ICD-10-CM | POA: Diagnosis not present

## 2017-10-09 DIAGNOSIS — L409 Psoriasis, unspecified: Secondary | ICD-10-CM | POA: Diagnosis not present

## 2017-10-09 DIAGNOSIS — I10 Essential (primary) hypertension: Secondary | ICD-10-CM

## 2017-10-09 DIAGNOSIS — Z79899 Other long term (current) drug therapy: Secondary | ICD-10-CM

## 2017-10-09 DIAGNOSIS — M19042 Primary osteoarthritis, left hand: Secondary | ICD-10-CM

## 2017-10-09 DIAGNOSIS — Z862 Personal history of diseases of the blood and blood-forming organs and certain disorders involving the immune mechanism: Secondary | ICD-10-CM

## 2017-10-09 DIAGNOSIS — Z8719 Personal history of other diseases of the digestive system: Secondary | ICD-10-CM | POA: Diagnosis not present

## 2017-10-09 MED ORDER — ETANERCEPT 50 MG/ML ~~LOC~~ SOAJ: SUBCUTANEOUS | 0 refills | Status: DC

## 2017-10-09 NOTE — Patient Instructions (Addendum)
Standing Labs We placed an order today for your standing lab work.    Please come back and get your standing labs in June and every 3 months  We have open lab Monday through Friday from 8:30-11:30 AM and 1:30-4:00 PM  at the office of Dr. Shaili Deveshwar.   You may experience shorter wait times on Monday and Friday afternoons. The office is located at [address], [address], [place], [place] [address] No appointment is necessary.   Labs are drawn by [facility].  You may receive a bill from [facility] for your lab work. If you have any questions regarding directions or hours of operation,  please call [phone].    

## 2017-10-26 ENCOUNTER — Other Ambulatory Visit: Payer: Self-pay | Admitting: Rheumatology

## 2017-10-27 NOTE — Telephone Encounter (Signed)
Last Visit: 10/09/17 Next Visit: 03/12/18 Labs: 09/04/17 cbc/cmp wnl  Okay to refill per Dr. Estanislado Pandy

## 2017-11-21 ENCOUNTER — Encounter: Payer: Self-pay | Admitting: Family Medicine

## 2017-11-21 LAB — HM MAMMOGRAPHY

## 2017-11-24 ENCOUNTER — Telehealth: Payer: Self-pay | Admitting: Rheumatology

## 2017-11-24 DIAGNOSIS — Z79899 Other long term (current) drug therapy: Secondary | ICD-10-CM

## 2017-11-24 NOTE — Telephone Encounter (Signed)
Patient states she does not need a refill currently. Patient states she will need it around 12/27/17. Patient staes starting in July her Enbrel will need to go to [facility].

## 2017-11-24 NOTE — Telephone Encounter (Signed)
[facility] has a question about Enbrel RX. Please call to discuss.

## 2017-12-04 ENCOUNTER — Telehealth: Payer: Self-pay | Admitting: Rheumatology

## 2017-12-04 DIAGNOSIS — Z79899 Other long term (current) drug therapy: Secondary | ICD-10-CM

## 2017-12-04 NOTE — Telephone Encounter (Signed)
Order's released

## 2017-12-04 NOTE — Telephone Encounter (Signed)
Patient called requesting her labwork orders to be sent to [facility] on [address] this afternoon.

## 2017-12-08 ENCOUNTER — Telehealth: Payer: Self-pay

## 2017-12-08 NOTE — Telephone Encounter (Signed)
Note opened in error.

## 2017-12-09 ENCOUNTER — Telehealth: Payer: Self-pay | Admitting: Rheumatology

## 2017-12-09 MED ORDER — FOLIC ACID 1 MG PO TABS: 2.0000 mg | ORAL_TABLET | Freq: Every day | ORAL | 3 refills | Status: DC

## 2017-12-09 NOTE — Telephone Encounter (Signed)
Patient called stating she spoke with a representative from [facility] and stated Dr. Estanislado Pandy can fax or call their office to refill her Enbrel.  Phone# [phone]  Or fax [phone]    Patient also stated she would like to refill her Folic Acid at [facility] because it is less expensive.  Their phone [phone]

## 2017-12-09 NOTE — Telephone Encounter (Signed)
Last Visit: 10/09/17 Next Visit: 03/12/18  Okay to refill per Dr. Estanislado Pandy

## 2017-12-09 NOTE — Telephone Encounter (Signed)
Patient called requesting prescription refill of [facility] [address] [facility] [place].

## 2017-12-10 ENCOUNTER — Telehealth: Payer: Self-pay | Admitting: Family Medicine

## 2017-12-10 DIAGNOSIS — K219 Gastro-esophageal reflux disease without esophagitis: Secondary | ICD-10-CM

## 2017-12-10 LAB — CBC WITH DIFFERENTIAL/PLATELET
BASOS ABS: 0 10*3/uL (ref 0.0–0.2)
Basos: 0 %
EOS (ABSOLUTE): 0.3 10*3/uL (ref 0.0–0.4)
Eos: 4 %
Hematocrit: 35 % (ref 34.0–46.6)
Hemoglobin: 12.1 g/dL (ref 11.1–15.9)
Immature Grans (Abs): 0 10*3/uL (ref 0.0–0.1)
Immature Granulocytes: 0 %
LYMPHS ABS: 2.5 10*3/uL (ref 0.7–3.1)
Lymphs: 41 %
MCH: 32.9 pg (ref 26.6–33.0)
MCHC: 34.6 g/dL (ref 31.5–35.7)
MCV: 95 fL (ref 79–97)
MONOS ABS: 0.7 10*3/uL (ref 0.1–0.9)
Monocytes: 11 %
Neutrophils Absolute: 2.6 10*3/uL (ref 1.4–7.0)
Neutrophils: 44 %
Platelets: 250 10*3/uL (ref 150–450)
RBC: 3.68 x10E6/uL — AB (ref 3.77–5.28)
RDW: 14.1 % (ref 12.3–15.4)
WBC: 6 10*3/uL (ref 3.4–10.8)

## 2017-12-10 LAB — CMP14+EGFR
ALK PHOS: 96 IU/L (ref 39–117)
ALT: 17 IU/L (ref 0–32)
AST: 16 IU/L (ref 0–40)
Albumin/Globulin Ratio: 1.6 (ref 1.2–2.2)
Albumin: 4.1 g/dL (ref 3.6–4.8)
BUN/Creatinine Ratio: 24 (ref 12–28)
BUN: 15 mg/dL (ref 8–27)
Bilirubin Total: <0.2 mg/dL (ref 0.0–1.2)
CO2: 25 mmol/L (ref 20–29)
CREATININE: 0.63 mg/dL (ref 0.57–1.00)
Calcium: 9.1 mg/dL (ref 8.7–10.3)
Chloride: 103 mmol/L (ref 96–106)
GFR calc Af Amer: 111 mL/min/{1.73_m2} (ref >59)
GFR calc non Af Amer: 96 mL/min/{1.73_m2} (ref >59)
GLUCOSE: 90 mg/dL (ref 65–99)
Globulin, Total: 2.6 g/dL (ref 1.5–4.5)
Potassium: 4.3 mmol/L (ref 3.5–5.2)
SODIUM: 141 mmol/L (ref 134–144)
Total Protein: 6.7 g/dL (ref 6.0–8.5)

## 2017-12-10 MED ORDER — FOLIC ACID 1 MG PO TABS: 2.0000 mg | ORAL_TABLET | Freq: Every day | ORAL | 3 refills | Status: DC

## 2017-12-10 MED ORDER — OMEPRAZOLE 40 MG PO CPDR: 40.0000 mg | DELAYED_RELEASE_CAPSULE | Freq: Every day | ORAL | 3 refills | Status: DC

## 2017-12-10 MED ORDER — ETANERCEPT 50 MG/ML ~~LOC~~ SOAJ: SUBCUTANEOUS | 0 refills | Status: DC

## 2017-12-10 NOTE — Telephone Encounter (Signed)
Last Visit: 10/09/17 Next Visit: 03/12/18 Labs: 12/09/17 RBC 3.68. All other labs are WNL. TB Gold: 01/13/17 Neg   Okay to refill per Estanislado Pandy

## 2017-12-10 NOTE — Telephone Encounter (Signed)
  Fax refill request from [facility]  [address] 90 day supply

## 2017-12-15 ENCOUNTER — Other Ambulatory Visit: Payer: Self-pay

## 2017-12-15 DIAGNOSIS — G40909 Epilepsy, unspecified, not intractable, without status epilepticus: Secondary | ICD-10-CM

## 2017-12-15 MED ORDER — DILANTIN 100 MG PO CAPS: 200.0000 mg | ORAL_CAPSULE | Freq: Two times a day (BID) | ORAL | 3 refills | Status: DC

## 2017-12-15 NOTE — Telephone Encounter (Signed)
Pt is requesting that medication be sent to pharmacy that has already been updated. Is this refill appropriate?

## 2018-02-16 ENCOUNTER — Ambulatory Visit: Payer: BLUE CROSS/BLUE SHIELD | Admitting: Family Medicine

## 2018-02-19 ENCOUNTER — Other Ambulatory Visit: Payer: Self-pay | Admitting: Rheumatology

## 2018-02-19 NOTE — Telephone Encounter (Signed)
Last Visit:10/09/17 Next Visit:03/12/18 Labs: 12/09/17 RBC 3.68. All other labs are WNL. TB Gold: 01/13/17 Neg   Okay to refill per Estanislado Pandy

## 2018-02-20 ENCOUNTER — Other Ambulatory Visit: Payer: Self-pay | Admitting: Rheumatology

## 2018-02-20 NOTE — Telephone Encounter (Signed)
Last Visit:10/09/17 Next Visit:03/12/18 Labs: 12/09/17 RBC 3.68. All other labs are WNL  Okay to refill per [facility]

## 2018-03-09 ENCOUNTER — Other Ambulatory Visit: Payer: Self-pay | Admitting: Rheumatology

## 2018-03-09 NOTE — Telephone Encounter (Signed)
Last Visit:10/09/17 Next Visit:03/12/18  Okay to refill per Dr. Estanislado Pandy

## 2018-03-10 ENCOUNTER — Other Ambulatory Visit: Payer: Self-pay | Admitting: Rheumatology

## 2018-03-10 ENCOUNTER — Other Ambulatory Visit: Payer: Self-pay | Admitting: *Deleted

## 2018-03-10 DIAGNOSIS — Z9225 Personal history of immunosupression therapy: Secondary | ICD-10-CM

## 2018-03-10 DIAGNOSIS — Z79899 Other long term (current) drug therapy: Secondary | ICD-10-CM

## 2018-03-10 NOTE — Telephone Encounter (Signed)
Last Visit:10/09/17 Next Visit:03/12/18 Labs: 12/09/17 RBC 3.68. All other labs are WNL. Tb Gold: 01/23/17 neg  Patient advised she is due for labs. Patient will go today or tomorrow to update labs.   Okay to refill 30 day supply per Dr. Estanislado Pandy

## 2018-03-11 NOTE — Progress Notes (Signed)
Mild anemia, advise MVI with Fe.

## 2018-03-12 ENCOUNTER — Ambulatory Visit: Payer: BLUE CROSS/BLUE SHIELD | Admitting: Physician Assistant

## 2018-03-12 LAB — QUANTIFERON-TB GOLD PLUS
NIL: 0.1 [IU]/mL
QuantiFERON-TB Gold Plus: NEGATIVE
TB1-NIL: 0.01 IU/mL
TB2-NIL: 0.02 IU/mL

## 2018-03-12 LAB — CBC WITH DIFFERENTIAL/PLATELET
BASOS PCT: 0.4 %
Basophils Absolute: 21 cells/uL (ref 0–200)
EOS ABS: 223 {cells}/uL (ref 15–500)
Eosinophils Relative: 4.2 %
HCT: 33.8 % — ABNORMAL LOW (ref 35.0–45.0)
Hemoglobin: 11.5 g/dL — ABNORMAL LOW (ref 11.7–15.5)
Lymphs Abs: 2221 cells/uL (ref 850–3900)
MCH: 31.7 pg (ref 27.0–33.0)
MCHC: 34 g/dL (ref 32.0–36.0)
MCV: 93.1 fL (ref 80.0–100.0)
MPV: 10.4 fL (ref 7.5–12.5)
Monocytes Relative: 10.4 %
NEUTROS ABS: 2284 {cells}/uL (ref 1500–7800)
Neutrophils Relative %: 43.1 %
Platelets: 234 10*3/uL (ref 140–400)
RBC: 3.63 10*6/uL — ABNORMAL LOW (ref 3.80–5.10)
RDW: 12.8 % (ref 11.0–15.0)
Total Lymphocyte: 41.9 %
WBC: 5.3 10*3/uL (ref 3.8–10.8)
WBCMIX: 551 {cells}/uL (ref 200–950)

## 2018-03-12 LAB — COMPLETE METABOLIC PANEL WITH GFR
AG RATIO: 1.4 (calc) (ref 1.0–2.5)
ALBUMIN MSPROF: 4 g/dL (ref 3.6–5.1)
ALKALINE PHOSPHATASE (APISO): 94 U/L (ref 33–130)
ALT: 17 U/L (ref 6–29)
AST: 19 U/L (ref 10–35)
BUN: 14 mg/dL (ref 7–25)
CO2: 27 mmol/L (ref 20–32)
Calcium: 9.1 mg/dL (ref 8.6–10.4)
Chloride: 104 mmol/L (ref 98–110)
Creat: 0.6 mg/dL (ref 0.50–0.99)
GFR, EST NON AFRICAN AMERICAN: 98 mL/min/{1.73_m2} (ref >=60)
GFR, Est African American: 113 mL/min/{1.73_m2} (ref >=60)
GLOBULIN: 2.8 g/dL (ref 1.9–3.7)
Glucose, Bld: 66 mg/dL (ref 65–139)
POTASSIUM: 4.2 mmol/L (ref 3.5–5.3)
SODIUM: 138 mmol/L (ref 135–146)
Total Bilirubin: 0.2 mg/dL (ref 0.2–1.2)
Total Protein: 6.8 g/dL (ref 6.1–8.1)

## 2018-03-13 ENCOUNTER — Telehealth: Payer: Self-pay | Admitting: Rheumatology

## 2018-03-13 NOTE — Telephone Encounter (Signed)
Attempted to contact patient and left message for patient to call the office.  

## 2018-03-13 NOTE — Telephone Encounter (Signed)
Patient left a voicemail stating she got your message regarding her labwork and her iron.  Patient states she takes a multivitamin and wants to know if she should take something else to get her iron up.  Patient requested a return call.

## 2018-03-16 ENCOUNTER — Encounter: Payer: Self-pay | Admitting: Family Medicine

## 2018-03-16 ENCOUNTER — Telehealth: Payer: Self-pay | Admitting: Family Medicine

## 2018-03-16 NOTE — Telephone Encounter (Signed)
Pt called and states she is in need of a letter stating she needs to wear tennis shoes to work as she works on [employer].  She said you had told her in the past you would do a letter if needed.  Please email to pt    [email or web address]

## 2018-03-16 NOTE — Telephone Encounter (Signed)
Give her a letter

## 2018-03-16 NOTE — Telephone Encounter (Signed)
Letter completed and emailed

## 2018-03-21 ENCOUNTER — Other Ambulatory Visit: Payer: Self-pay | Admitting: Family Medicine

## 2018-03-21 DIAGNOSIS — G40909 Epilepsy, unspecified, not intractable, without status epilepticus: Secondary | ICD-10-CM

## 2018-03-23 NOTE — Telephone Encounter (Signed)
[facility] Please advise [facility]

## 2018-04-08 ENCOUNTER — Other Ambulatory Visit: Payer: Self-pay | Admitting: Rheumatology

## 2018-04-08 NOTE — Telephone Encounter (Signed)
Last Visit:10/09/17 Next Visit: 10/13/18 Labs: 03/10/18 CMP WNL. Mild anemia, TB gold: 03/10/18 Neg   Left message to advise patient she needs a sooner follow up visit   Okay to refill per Dr. Estanislado Pandy

## 2018-04-13 ENCOUNTER — Telehealth: Payer: Self-pay | Admitting: Rheumatology

## 2018-04-13 NOTE — Telephone Encounter (Signed)
Patient called stating she was returning your call.  Patient states she has a follow-up appointment scheduled for 10/13/18.  Patient states if she needs an appointment before that date she is requesting to see her primary care due to insurance reasons.

## 2018-04-14 ENCOUNTER — Other Ambulatory Visit: Payer: Self-pay | Admitting: Rheumatology

## 2018-04-14 NOTE — Telephone Encounter (Signed)
Last Visit:10/09/17 Next Visit: 04/20/18 Labs: 03/10/18 CMP WNL. Mild anemia  Okay to refill per Dr. Estanislado Pandy

## 2018-04-14 NOTE — Telephone Encounter (Signed)
Spoke with patient and advised we have to see her here in our office more than once per year because of the medications she is on. Patient verbalized understanding. Patient has been schedule for 04/20/18 .

## 2018-04-14 NOTE — Progress Notes (Signed)
Office Visit Note  Patient: Kimberly Butler             Date of Birth: [birth year]           MRN: [id number]             PCP: Denita Lung, MD Referring: Denita Lung, MD Visit Date: 04/20/2018 Occupation: @GUAROCC @  Subjective:  Mediation monitoring    History of Present Illness: Kimberly Butler is a 62 y.o. female with history of psoriatic arthritis and osteoarthritis.  She continues to inject Enbrel 50 mg sq once weekly.  She discontinued MTX 3 weeks ago.  She states she has been doing well and wanted to try to space out MTX dose.  She denies any joint pain or joint swelling at this time.  She has intermittent right ankle pain. She denies any plantar fasciitis or achilles tendonitis.  She denies any SI joint pain.  She denies any psoriasis.  She continues to hike on a regular basis.  She denies any knee joint pain.  She has not had a yearly skin exam recently.  She reports she has had her yearly flu vaccine.     Activities of Daily Living:  Patient reports morning stiffness for 0  minutes.   Patient Denies nocturnal pain.  Difficulty dressing/grooming: Denies Difficulty climbing stairs: Reports Difficulty getting out of chair: Denies Difficulty using hands for taps, buttons, cutlery, and/or writing: Denies  Review of Systems  Constitutional: Negative for fatigue.  HENT: Negative for mouth sores, mouth dryness and nose dryness.   Eyes: Negative for pain, visual disturbance and dryness.  Respiratory: Negative for cough, hemoptysis, shortness of breath and difficulty breathing.   Cardiovascular: Negative for chest pain, palpitations, hypertension and swelling in legs/feet.  Gastrointestinal: Negative for blood in stool, constipation and diarrhea.  Endocrine: Negative for increased urination.  Genitourinary: Negative for painful urination.  Musculoskeletal: Positive for arthralgias and joint pain. Negative for joint swelling, myalgias, muscle weakness, morning stiffness, muscle  tenderness and myalgias.  Skin: Negative for color change, pallor, rash, hair loss, nodules/bumps, skin tightness, ulcers and sensitivity to sunlight.  Allergic/Immunologic: Negative for susceptible to infections.  Neurological: Negative for dizziness, numbness, headaches and weakness.  Hematological: Negative for swollen glands.  Psychiatric/Behavioral: Negative for depressed mood and sleep disturbance. The patient is not nervous/anxious.     PMFS History:  Patient Active Problem List   Diagnosis Date Noted  . Lactose intolerance in adult 11/18/2016  . Post-menopausal 08/28/2016  . High risk medication use 08/28/2016  . History of vitamin D deficiency 08/28/2016  . History of gastroesophageal reflux (GERD) 08/28/2016  . History of iron deficiency anemia 08/28/2016  . Psoriasis 07/07/2014  . Seizure disorder ([place]) 01/07/2011  . GERD (gastroesophageal reflux disease) 01/07/2011  . Hypertension 01/07/2011  . Psoriatic arthritis ([place]) 01/07/2011  . Allergic rhinitis due to pollen 01/07/2011  . Obesity (BMI 30-39.9) 01/07/2011    Past Medical History:  Diagnosis Date  . Allergy    RHINITIS  . 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 Epilepsy ([place])    hasn't had seizure in 20 years (01/15/17)  . GERD (gastroesophageal reflux disease)   . Hypertension   . Obesity   . Psoriasis   . Psoriatic arthritis ([place])   . Smoker   . Varicose veins     Family History  Problem Relation Age of Onset  . Cancer Mother   . Heart disease Mother   . Arthritis  Father   . Kidney failure Father   . Dementia Father   . Diabetes Father   . Cancer Sister   . Cancer Brother   . Healthy Son    Past Surgical History:  Procedure Laterality Date  . CATARACT EXTRACTION W/ INTRAOCULAR LENS IMPLANT Bilateral   . CESAREAN SECTION    . CHOLECYSTECTOMY N/A 01/23/2017   Procedure: LAPAROSCOPIC CHOLECYSTECTOMY;  Surgeon: Georganna Skeans, MD;  Location: [facility];  Service: General;  Laterality: N/A;  .  TONSILECTOMY, ADENOIDECTOMY, BILATERAL MYRINGOTOMY AND TUBES    . TUBAL LIGATION     Social History   Social History Narrative  . Not on file   Immunization History  Administered Date(s) Administered  . DTaP 02/06/1994, 11/15/2004  . Influenza Whole 07/24/2009  . Influenza,inj,Quad PF,6+ Mos 03/29/2016  . Influenza-Unspecified 03/10/2014, 03/10/2017  . PPD Test 10/20/1992  . Pneumococcal Conjugate-13 12/18/2007  . Pneumococcal Polysaccharide-23 03/31/2014  . Tdap 03/29/2016     Objective: Vital Signs: BP 136/85 (BP Location: Left Arm, Patient Position: Sitting, Cuff Size: Normal)   Pulse (!) 54   Resp 13   Ht 5\' 7"  (1.702 m)   Wt 221 lb 9.6 oz (100.5 kg)   BMI 34.71 kg/m    Physical Exam  Constitutional: She is oriented to person, place, and time. She appears well-developed and well-nourished.  HENT:  Head: Normocephalic and atraumatic.  Eyes: Conjunctivae and EOM are normal.  Neck: Normal range of motion.  Cardiovascular: Normal rate, regular rhythm, normal heart sounds and intact distal pulses.  Pulmonary/Chest: Effort normal and breath sounds normal.  Abdominal: Soft. Bowel sounds are normal.  Lymphadenopathy:    She has no cervical adenopathy.  Neurological: She is alert and oriented to person, place, and time.  Skin: Skin is warm and dry. Capillary refill takes less than 2 seconds.  Psychiatric: She has a normal mood and affect. Her behavior is normal.  Nursing note and vitals reviewed.    Musculoskeletal Exam: C-spine good ROM with no discomfort.  Thoracic kyphosis. Good ROM of lumbar spine.  No midline spinal tenderness.  No SI joint tenderness.  Shoulder joints, elbow joints, wrist joints, MCPs, PIPs,and DIPs good ROM with no synovitis.  PIP and DIP synovial thickening.  Hip joints, knee joints, ankle joints, MTPs, PIPs, and DIPs good ROM with no synovitis.  No warmth or effusion of knee joints.  Pedal edema bilaterally.  1st MTP synovial thickening bilaterally.  No tenderness of trochanteric bursa bilaterally.    CDAI Exam: CDAI Score: 1.2  Patient Global Assessment: 6 (mm); Provider Global Assessment: 6 (mm) Swollen: 0 ; Tender: 0  Joint Exam   Not documented   There is currently no information documented on the homunculus. Go to the Rheumatology activity and complete the homunculus joint exam.  Investigation: No additional findings.  Imaging: No results found.  Recent Labs: Lab Results  Component Value Date   WBC 5.3 03/10/2018   HGB 11.5 (L) 03/10/2018   PLT 234 03/10/2018   NA 138 03/10/2018   K 4.2 03/10/2018   CL 104 03/10/2018   CO2 27 03/10/2018   GLUCOSE 66 03/10/2018   BUN 14 03/10/2018   CREATININE 0.60 03/10/2018   BILITOT 0.2 03/10/2018   ALKPHOS 96 12/09/2017   AST 19 03/10/2018   ALT 17 03/10/2018   PROT 6.8 03/10/2018   ALBUMIN 4.1 12/09/2017   CALCIUM 9.1 03/10/2018   GFRAA 113 03/10/2018   QFTBGOLDPLUS NEGATIVE 03/10/2018    Speciality Comments: No specialty  comments available.  Procedures:  No procedures performed Allergies: No known allergies     Assessment / Plan:     Visit Diagnoses: Psoriatic arthritis ([place]): She has no synovitis or dactylitis on exam.  She has not had any recent psoriatic arthritis flares.  She has no psoriasis at this time.  She has no Achilles tendinitis or plantar fasciitis.  She has no SI joint tenderness.  She continues to inject Enbrel 50 mg subcutaneous injections once weekly.  She has skipped 3 doses of methotrexate due to experiencing fatigue and seeing if she could space out the doses of methotrexate.  We discussed restarting on methotrexate since she has intermittent right ankle pain and scattered patches of psoriasis at times.  She will restart on methotrexate 0.7 mL subcutaneously once weekly.  Most recent x-ray of bilateral hands, feet, ankle on 04/07/2017.  Refills of Enbrel, methotrexate, and folic acid will be sent to the pharmacy today.  She was advised to notify us  that she does increased joint pain or joint swelling.  She will follow-up in the office in 5 months.  She was reminded to get yearly skin exams.  She has had her yearly influenza vaccination.   Psoriasis: She has no active psoriasis at this time.  High risk medication use - Methotrexate 0.7 mL subcutaneous every week, folic acid 2 mg by mouth daily and Enbrel 50 mg subcutaneous every week.  CBC and CMP were drawn on 03/10/2018.  Lab work revealed mild anemia she was advised to start taking multivitamin with iron.  TB gold was negative on 03/10/2018.  She will return in January and every 3 months for lab work to monitor for drug toxicity peer  Primary osteoarthritis of both hands: She has PIP and DIP synovial thickening consistent with osteoarthritis of bilateral hands.  She has complete fist formation bilaterally.  No synovitis noted.  Joint protection and muscle strengthening were discussed.  Primary osteoarthritis of both knees: No warmth or effusion.  Good range of motion with no discomfort.  Vitamin D deficiency: She takes vitamin D 5000 units by mouth daily as well as a calcium supplement.  Other medical conditions are listed as follows:  History of gastroesophageal reflux (GERD)  History of iron deficiency anemia  History of epilepsy  Essential hypertension   Orders: No orders of the defined types were placed in this encounter.  Meds ordered this encounter  Medications  . etanercept (ENBREL SURECLICK) 50 MG/ML injection    Sig: INJECT 1 PEN UNDER THE SKIN EVERY 7 DAYS.    Dispense:  12 Syringe    Refill:  0  . folic acid (FOLVITE) 1 MG tablet    Sig: Take 2 tablets (2 mg total) by mouth daily.    Dispense:  180 tablet    Refill:  3  . methotrexate 50 MG/2ML injection    Sig: Inject 0.7 mLs (17.5 mg total) into the skin once a week.    Dispense:  9 mL    Refill:  0    Please Dispense 2 mL vials with preservatives    Follow-Up Instructions: Return in about 5 months  (around 09/19/2018) for Psoriatic arthritis, Osteoarthritis.   Ofilia Neas, PA-C  Note - This record has been created using Dragon software.  Chart creation errors have been sought, but may not always  have been located. Such creation errors do not reflect on  the standard of medical care.

## 2018-04-20 ENCOUNTER — Ambulatory Visit (INDEPENDENT_AMBULATORY_CARE_PROVIDER_SITE_OTHER): Payer: BLUE CROSS/BLUE SHIELD | Admitting: Physician Assistant

## 2018-04-20 ENCOUNTER — Encounter: Payer: Self-pay | Admitting: Physician Assistant

## 2018-04-20 VITALS — BP 136/85 | HR 54 | Resp 13 | Ht 67.0 in | Wt 221.6 lb

## 2018-04-20 DIAGNOSIS — Z79899 Other long term (current) drug therapy: Secondary | ICD-10-CM

## 2018-04-20 DIAGNOSIS — L409 Psoriasis, unspecified: Secondary | ICD-10-CM

## 2018-04-20 DIAGNOSIS — L4050 Arthropathic psoriasis, unspecified: Secondary | ICD-10-CM

## 2018-04-20 DIAGNOSIS — Z8669 Personal history of other diseases of the nervous system and sense organs: Secondary | ICD-10-CM

## 2018-04-20 DIAGNOSIS — M19041 Primary osteoarthritis, right hand: Secondary | ICD-10-CM | POA: Diagnosis not present

## 2018-04-20 DIAGNOSIS — E559 Vitamin D deficiency, unspecified: Secondary | ICD-10-CM

## 2018-04-20 DIAGNOSIS — M170 Bilateral primary osteoarthritis of knee: Secondary | ICD-10-CM

## 2018-04-20 DIAGNOSIS — M19042 Primary osteoarthritis, left hand: Secondary | ICD-10-CM

## 2018-04-20 DIAGNOSIS — I10 Essential (primary) hypertension: Secondary | ICD-10-CM

## 2018-04-20 DIAGNOSIS — Z8719 Personal history of other diseases of the digestive system: Secondary | ICD-10-CM

## 2018-04-20 DIAGNOSIS — Z862 Personal history of diseases of the blood and blood-forming organs and certain disorders involving the immune mechanism: Secondary | ICD-10-CM

## 2018-04-20 MED ORDER — FOLIC ACID 1 MG PO TABS: 2.0000 mg | ORAL_TABLET | Freq: Every day | ORAL | 3 refills | Status: DC

## 2018-04-20 MED ORDER — METHOTREXATE SODIUM CHEMO INJECTION 50 MG/2ML: 17.5000 mg | INTRAMUSCULAR | 0 refills | Status: DC

## 2018-04-20 MED ORDER — ETANERCEPT 50 MG/ML ~~LOC~~ SOAJ: SUBCUTANEOUS | 0 refills | Status: DC

## 2018-04-20 NOTE — Patient Instructions (Signed)
Standing Labs We placed an order today for your standing lab work.    Please come back and get your standing labs in January and every 3 months   We have open lab Monday through Friday from 8:30-11:30 AM and 1:30-4:00 PM  at the office of Dr. Shaili Deveshwar.   You may experience shorter wait times on Monday and Friday afternoons. The office is located at [address], [address], [place], [place] [address] No appointment is necessary.   Labs are drawn by [facility].  You may receive a bill from [facility] for your lab work. If you have any questions regarding directions or hours of operation,  please call [phone].   Just as a reminder please drink plenty of water prior to coming for your lab work. Thanks!   

## 2018-04-21 ENCOUNTER — Other Ambulatory Visit: Payer: Self-pay | Admitting: Rheumatology

## 2018-04-21 NOTE — Telephone Encounter (Addendum)
Refill sent in at appointment on 04/20/18

## 2018-07-06 ENCOUNTER — Other Ambulatory Visit: Payer: Self-pay | Admitting: Rheumatology

## 2018-07-06 NOTE — Telephone Encounter (Signed)
Last Visit: 04/21/18 Next Visit: 10/13/18 Labs: 03/10/18 Mild anemia, CMP WNL  Patient advised she is due to update labs. Patient will update this week.  Okay to refill to refill 30 day supply per Dr. Estanislado Pandy

## 2018-07-08 ENCOUNTER — Telehealth: Payer: Self-pay | Admitting: Rheumatology

## 2018-07-08 DIAGNOSIS — Z79899 Other long term (current) drug therapy: Secondary | ICD-10-CM

## 2018-07-08 NOTE — Telephone Encounter (Signed)
Lab Orders released.  

## 2018-07-08 NOTE — Telephone Encounter (Signed)
Patient going to [place] at [facility] for labs tomorrow am. Please release orders.

## 2018-07-10 LAB — CBC WITH DIFFERENTIAL/PLATELET
Basophils Absolute: 0 10*3/uL (ref 0.0–0.2)
Basos: 1 %
EOS (ABSOLUTE): 0.3 10*3/uL (ref 0.0–0.4)
EOS: 4 %
HEMATOCRIT: 34.7 % (ref 34.0–46.6)
HEMOGLOBIN: 12 g/dL (ref 11.1–15.9)
IMMATURE GRANS (ABS): 0 10*3/uL (ref 0.0–0.1)
Immature Granulocytes: 0 %
Lymphocytes Absolute: 3 10*3/uL (ref 0.7–3.1)
Lymphs: 47 %
MCH: 31.9 pg (ref 26.6–33.0)
MCHC: 34.6 g/dL (ref 31.5–35.7)
MCV: 92 fL (ref 79–97)
MONOCYTES: 10 %
Monocytes Absolute: 0.6 10*3/uL (ref 0.1–0.9)
Neutrophils Absolute: 2.4 10*3/uL (ref 1.4–7.0)
Neutrophils: 38 %
Platelets: 245 10*3/uL (ref 150–450)
RBC: 3.76 x10E6/uL — ABNORMAL LOW (ref 3.77–5.28)
RDW: 12.5 % (ref 11.7–15.4)
WBC: 6.3 10*3/uL (ref 3.4–10.8)

## 2018-07-10 LAB — CMP14+EGFR
ALBUMIN: 4.3 g/dL (ref 3.8–4.8)
ALT: 16 IU/L (ref 0–32)
AST: 18 IU/L (ref 0–40)
Albumin/Globulin Ratio: 1.5 (ref 1.2–2.2)
Alkaline Phosphatase: 99 IU/L (ref 39–117)
BUN/Creatinine Ratio: 21 (ref 12–28)
BUN: 16 mg/dL (ref 8–27)
Bilirubin Total: <0.2 mg/dL (ref 0.0–1.2)
CO2: 24 mmol/L (ref 20–29)
Calcium: 9.3 mg/dL (ref 8.7–10.3)
Chloride: 101 mmol/L (ref 96–106)
Creatinine, Ser: 0.77 mg/dL (ref 0.57–1.00)
GFR calc Af Amer: 96 mL/min/{1.73_m2} (ref >59)
GFR, EST NON AFRICAN AMERICAN: 83 mL/min/{1.73_m2} (ref >59)
Globulin, Total: 2.9 g/dL (ref 1.5–4.5)
Glucose: 64 mg/dL — ABNORMAL LOW (ref 65–99)
Potassium: 4.2 mmol/L (ref 3.5–5.2)
Sodium: 139 mmol/L (ref 134–144)
Total Protein: 7.2 g/dL (ref 6.0–8.5)

## 2018-07-10 NOTE — Telephone Encounter (Signed)
stable °

## 2018-08-02 ENCOUNTER — Other Ambulatory Visit: Payer: Self-pay | Admitting: Family Medicine

## 2018-08-02 DIAGNOSIS — I10 Essential (primary) hypertension: Secondary | ICD-10-CM

## 2018-08-03 ENCOUNTER — Telehealth: Payer: Self-pay | Admitting: Family Medicine

## 2018-08-03 MED ORDER — LOSARTAN POTASSIUM 50 MG PO TABS: 50.0000 mg | ORAL_TABLET | Freq: Every day | ORAL | 1 refills | Status: DC

## 2018-08-03 MED ORDER — HYDROCHLOROTHIAZIDE 12.5 MG PO CAPS: 12.5000 mg | ORAL_CAPSULE | Freq: Every day | ORAL | 1 refills | Status: DC

## 2018-08-03 NOTE — Telephone Encounter (Signed)
Recv'd fax from [facility] that Losartan pot/ HCTZ tab 50-12.5 is mfr backordered, please provide a new Rx

## 2018-08-21 ENCOUNTER — Telehealth: Payer: Self-pay | Admitting: Rheumatology

## 2018-08-21 ENCOUNTER — Other Ambulatory Visit: Payer: Self-pay | Admitting: Rheumatology

## 2018-08-21 MED ORDER — ETANERCEPT 50 MG/ML ~~LOC~~ SOAJ: SUBCUTANEOUS | 0 refills | Status: DC

## 2018-08-21 NOTE — Telephone Encounter (Signed)
Patient called stating she received a call from her specialty pharmacy who told her they have faxed two requests for prescription refill of Enbrel to our office with no response.  Patient states she has one injection left.  Patient requested a return call.

## 2018-08-21 NOTE — Telephone Encounter (Signed)
Last Visit:04/21/18 Next Visit:10/13/18 Labs:07/09/18 stable  Okay to refill per Dr. Estanislado Pandy

## 2018-08-21 NOTE — Telephone Encounter (Signed)
Last Visit: 04/21/18 Next Visit: 10/13/18 Labs: 07/09/18 stable TB Gold: 03/10/18 Neg   Okay to refill per Dr. Estanislado Pandy

## 2018-09-07 ENCOUNTER — Encounter: Payer: BLUE CROSS/BLUE SHIELD | Admitting: Family Medicine

## 2018-10-05 ENCOUNTER — Telehealth: Payer: Self-pay | Admitting: Rheumatology

## 2018-10-05 NOTE — Telephone Encounter (Signed)
Called patient and discussed that we are being more understanding with labs right now due to [place]. patient rescheduled her appointment on 11/23/2018 and will get labs drawn at the end of May.

## 2018-10-05 NOTE — Telephone Encounter (Signed)
Patient called stating she rescheduled her appointment for Monday, 11/23/18.  Patient states she would like to wait until her appointment to have her labwork if possible.

## 2018-10-12 ENCOUNTER — Other Ambulatory Visit: Payer: Self-pay | Admitting: Rheumatology

## 2018-10-12 NOTE — Telephone Encounter (Signed)
Last Visit: 04/20/2018 Next Visit: 11/23/2018 Labs: 07/09/2018 stable  TB Gold: 03/10/2018 negative   Patient is aware she is due for labs and will have them drawn at the end of May.  Okay to refill per Dr. Estanislado Pandy.

## 2018-10-13 ENCOUNTER — Ambulatory Visit: Payer: BLUE CROSS/BLUE SHIELD | Admitting: Rheumatology

## 2018-10-20 ENCOUNTER — Other Ambulatory Visit: Payer: Self-pay | Admitting: Rheumatology

## 2018-10-20 NOTE — Telephone Encounter (Signed)
Last Visit: 04/20/2018 Next Visit: 11/23/2018 Labs: 07/09/2018 stable  TB Gold: 03/10/2018 negative   Patient advised she is due to update labs. Patient will update next week.   Okay to refill 30 day supply per Dr. Estanislado Pandy

## 2018-10-26 ENCOUNTER — Telehealth: Payer: Self-pay | Admitting: Family Medicine

## 2018-10-26 ENCOUNTER — Other Ambulatory Visit: Payer: Self-pay | Admitting: *Deleted

## 2018-10-26 DIAGNOSIS — G40909 Epilepsy, unspecified, not intractable, without status epilepticus: Secondary | ICD-10-CM

## 2018-10-26 DIAGNOSIS — Z79899 Other long term (current) drug therapy: Secondary | ICD-10-CM

## 2018-10-26 NOTE — Telephone Encounter (Signed)
Let her know that I put the order in.  Hopefully she can get it drawn over there

## 2018-10-26 NOTE — Telephone Encounter (Signed)
lvm for pt that labs were put in by Dr. Redmond School and to call if she needed anything else. [place]

## 2018-10-26 NOTE — Telephone Encounter (Signed)
Pt called and is wanting to get a lab order to get her dilantin level checked states she has been loosing weight, and want to make sure she has been taking the right amount, she is going to [facility] later to get labs for her rheumatologist, she can be reached [phone]

## 2018-10-30 ENCOUNTER — Other Ambulatory Visit: Payer: Self-pay

## 2018-10-30 ENCOUNTER — Other Ambulatory Visit: Payer: Self-pay | Admitting: Family Medicine

## 2018-10-30 DIAGNOSIS — G40909 Epilepsy, unspecified, not intractable, without status epilepticus: Secondary | ICD-10-CM

## 2018-10-31 LAB — CBC WITH DIFFERENTIAL/PLATELET
Basophils Absolute: 0 10*3/uL (ref 0.0–0.2)
Basos: 1 %
EOS (ABSOLUTE): 0.2 10*3/uL (ref 0.0–0.4)
Eos: 4 %
Hematocrit: 35.9 % (ref 34.0–46.6)
Hemoglobin: 12.3 g/dL (ref 11.1–15.9)
Immature Grans (Abs): 0 10*3/uL (ref 0.0–0.1)
Immature Granulocytes: 0 %
Lymphocytes Absolute: 2.3 10*3/uL (ref 0.7–3.1)
Lymphs: 42 %
MCH: 31.9 pg (ref 26.6–33.0)
MCHC: 34.3 g/dL (ref 31.5–35.7)
MCV: 93 fL (ref 79–97)
Monocytes Absolute: 0.5 10*3/uL (ref 0.1–0.9)
Monocytes: 10 %
Neutrophils Absolute: 2.4 10*3/uL (ref 1.4–7.0)
Neutrophils: 43 %
Platelets: 223 10*3/uL (ref 150–450)
RBC: 3.85 x10E6/uL (ref 3.77–5.28)
RDW: 13.1 % (ref 11.7–15.4)
WBC: 5.5 10*3/uL (ref 3.4–10.8)

## 2018-10-31 LAB — CMP14+EGFR
ALT: 19 IU/L (ref 0–32)
AST: 26 IU/L (ref 0–40)
Albumin/Globulin Ratio: 1.5 (ref 1.2–2.2)
Albumin: 4.1 g/dL (ref 3.8–4.8)
Alkaline Phosphatase: 92 IU/L (ref 39–117)
BUN/Creatinine Ratio: 27 (ref 12–28)
BUN: 19 mg/dL (ref 8–27)
Bilirubin Total: <0.2 mg/dL (ref 0.0–1.2)
CO2: 24 mmol/L (ref 20–29)
Calcium: 9.3 mg/dL (ref 8.7–10.3)
Chloride: 102 mmol/L (ref 96–106)
Creatinine, Ser: 0.71 mg/dL (ref 0.57–1.00)
GFR calc Af Amer: 105 mL/min/{1.73_m2} (ref >59)
GFR calc non Af Amer: 91 mL/min/{1.73_m2} (ref >59)
Globulin, Total: 2.7 g/dL (ref 1.5–4.5)
Glucose: 91 mg/dL (ref 65–99)
Potassium: 4.1 mmol/L (ref 3.5–5.2)
Sodium: 138 mmol/L (ref 134–144)
Total Protein: 6.8 g/dL (ref 6.0–8.5)

## 2018-11-03 LAB — PHENYTOIN LEVEL, FREE: Phenytoin, Free: 0.7 ug/mL — ABNORMAL LOW (ref 1.0–2.0)

## 2018-11-09 NOTE — Progress Notes (Signed)
Office Visit Note  Patient: Kimberly Butler             Date of Birth: 17-Jun-1955           MRN: [id number]             PCP: Denita Lung, MD Referring: Denita Lung, MD Visit Date: 11/23/2018 Occupation: @GUAROCC @  Subjective:  Medication monitoring    History of Present Illness: Zoila MONSERRAT VIDAURRI is a 63 y.o. female with history of psoriatic arthritis and osteoarthritis.  Patient is on Enbrel 50 mg sq injections every week, methotrexate 0.7 mL subcutaneous every week and folic acid 2 mg by mouth daily.  She has not missed any doses recently.  She has not had any recent infections.  She continues to have scattered patches of psoriasis.  She denies any joint pain or joint swelling at this time.  She reports she had edema bilaterally, worse in the right lower extremity.  She denies any plantar fasciitis or achilles tendonitis.  She denies any SI joint pain.  She has been working with a nutritionist and has been walking on a regular basis for exercise.  She states that she has tried to lose weight.  She reports she is unable to come off of her antacid due to dietary changes.   Activities of Daily Living:  Patient reports morning stiffness for 0 minute.   Patient Denies nocturnal pain.  Difficulty dressing/grooming: Denies Difficulty climbing stairs: Denies Difficulty getting out of chair: Denies Difficulty using hands for taps, buttons, cutlery, and/or writing: Denies  Review of Systems  Constitutional: Positive for fatigue.  HENT: Negative for mouth sores, mouth dryness and nose dryness.   Eyes: Negative for pain, visual disturbance and dryness.  Respiratory: Negative for cough, hemoptysis, shortness of breath, wheezing and difficulty breathing.   Cardiovascular: Positive for swelling in legs/feet. Negative for chest pain, palpitations and hypertension.  Gastrointestinal: Negative for blood in stool, constipation and diarrhea.  Endocrine: Negative for excessive thirst and increased  urination.  Genitourinary: Negative for difficulty urinating and painful urination.  Musculoskeletal: Positive for joint swelling. Negative for arthralgias, joint pain, myalgias, muscle weakness, morning stiffness, muscle tenderness and myalgias.  Skin: Negative for color change, pallor, rash, hair loss, nodules/bumps, redness, skin tightness, ulcers and sensitivity to sunlight.  Allergic/Immunologic: Negative for susceptible to infections.  Neurological: Negative for dizziness, numbness, headaches and weakness.  Hematological: Negative for swollen glands.  Psychiatric/Behavioral: Negative for depressed mood and sleep disturbance. The patient is not nervous/anxious.     PMFS History:  Patient Active Problem List   Diagnosis Date Noted  . Lactose intolerance in adult 11/18/2016  . Post-menopausal 08/28/2016  . High risk medication use 08/28/2016  . History of vitamin D deficiency 08/28/2016  . History of gastroesophageal reflux (GERD) 08/28/2016  . History of iron deficiency anemia 08/28/2016  . Psoriasis 07/07/2014  . Seizure disorder ([place]) 01/07/2011  . GERD (gastroesophageal reflux disease) 01/07/2011  . Hypertension 01/07/2011  . Psoriatic arthritis ([place]) 01/07/2011  . Allergic rhinitis due to pollen 01/07/2011  . Obesity (BMI 30-39.9) 01/07/2011    Past Medical History:  Diagnosis Date  . Allergy    RHINITIS  . 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 Epilepsy ([place])    hasn't had seizure in 20 years (01/15/17)  . GERD (gastroesophageal reflux disease)   . Hypertension   . Obesity   . Psoriasis   . Psoriatic arthritis ([place])   . Smoker   .  Varicose veins     Family History  Problem Relation Age of Onset  . Cancer Mother   . Heart disease Mother   . Arthritis Father   . Kidney failure Father   . Dementia Father   . Diabetes Father   . Cancer Sister   . Cancer Brother   . Healthy Son    Past Surgical History:  Procedure Laterality Date  . CATARACT EXTRACTION W/  INTRAOCULAR LENS IMPLANT Bilateral   . CESAREAN SECTION    . CHOLECYSTECTOMY N/A 01/23/2017   Procedure: LAPAROSCOPIC CHOLECYSTECTOMY;  Surgeon: Georganna Skeans, MD;  Location: [facility];  Service: General;  Laterality: N/A;  . TONSILECTOMY, ADENOIDECTOMY, BILATERAL MYRINGOTOMY AND TUBES    . TUBAL LIGATION     Social History   Social History Narrative  . Not on file   Immunization History  Administered Date(s) Administered  . DTaP 02/06/1994, 11/15/2004  . Influenza Inj Mdck Quad Pf 03/10/2017  . Influenza Whole 07/24/2009  . Influenza,inj,Quad PF,6+ Mos 03/29/2016, 03/24/2018  . Influenza-Unspecified 03/10/2014, 03/10/2017  . PPD Test 10/20/1992  . Pneumococcal Conjugate-13 12/18/2007  . Pneumococcal Polysaccharide-23 03/31/2014  . Tdap 03/29/2016     Objective: Vital Signs: BP 109/73 (BP Location: Left Arm, Patient Position: Sitting, Cuff Size: Normal)   Pulse (!) 54   Resp 11   Ht 5' 7"  (1.702 m)   Wt 205 lb (93 kg)   BMI 32.11 kg/m    Physical Exam Vitals signs and nursing note reviewed.  Constitutional:      Appearance: She is well-developed.  HENT:     Head: Normocephalic and atraumatic.  Eyes:     Conjunctiva/sclera: Conjunctivae normal.  Neck:     Musculoskeletal: Normal range of motion.  Cardiovascular:     Rate and Rhythm: Normal rate and regular rhythm.     Heart sounds: Normal heart sounds.  Pulmonary:     Effort: Pulmonary effort is normal.     Breath sounds: Normal breath sounds.  Abdominal:     General: Bowel sounds are normal.     Palpations: Abdomen is soft.  Lymphadenopathy:     Cervical: No cervical adenopathy.  Skin:    General: Skin is warm and dry.     Capillary Refill: Capillary refill takes less than 2 seconds.  Neurological:     Mental Status: She is alert and oriented to person, place, and time.  Psychiatric:        Behavior: Behavior normal.      Musculoskeletal Exam: C-spine, thoracic spine, lumbar spine good range of motion.   No midline spinal tenderness.  No SI joint tenderness.  Shoulder joints, elbow joints, wrist joints, MCPs, PIPs, DIPs good range of motion no synovitis.  She has PIP and DIP synovial thickening consistent with osteoarthritis of bilateral hands.  Hip joints have good range of motion with no discomfort.  Knee joints, ankle joints, MCPs, PIPs, DIPs good range of motion no synovitis.  No warmth or effusion of bilateral knee joints.  She has pedal edema bilaterally.  No tenderness over trochanteric bursa bilaterally.  CDAI Exam: CDAI Score: - Patient Global: -; Provider Global: - Swollen: -; Tender: - Joint Exam   No joint exam has been documented for this visit   There is currently no information documented on the homunculus. Go to the Rheumatology activity and complete the homunculus joint exam.  Investigation: No additional findings.  Imaging: No results found.  Recent Labs: Lab Results  Component Value Date  WBC 5.5 10/30/2018   HGB 12.3 10/30/2018   PLT 223 10/30/2018   NA 138 10/30/2018   K 4.1 10/30/2018   CL 102 10/30/2018   CO2 24 10/30/2018   GLUCOSE 91 10/30/2018   BUN 19 10/30/2018   CREATININE 0.71 10/30/2018   BILITOT <0.2 10/30/2018   ALKPHOS 92 10/30/2018   AST 26 10/30/2018   ALT 19 10/30/2018   PROT 6.8 10/30/2018   ALBUMIN 4.1 10/30/2018   CALCIUM 9.3 10/30/2018   GFRAA 105 10/30/2018   QFTBGOLDPLUS NEGATIVE 03/10/2018    Speciality Comments: No specialty comments available.  Procedures:  No procedures performed Allergies: No known allergies   Assessment / Plan:     Visit Diagnoses: Psoriatic arthritis ([place]) -She has no synovitis or dactylitis on exam.  She has not had any recent psoriatic arthritis flares.  She has no Achilles tendinitis or plantar fasciitis.  No SI joint tenderness on exam.  She has no joint pain or joint swelling at this time.  She has no morning stiffness. She states at times her left hip will "give out" on her.  She denies any  discomfort of the left hip joint.  She has good range of motion on exam today.  She was given a handout of hip joint exercises.  She declined a left hip x-ray today. She is clinically doing well on Enbrel 50 mg subcutaneous injections every week, methotrexate 0.7 mL subcu once weekly and folic acid 2 mg by mouth daily.  She does not need any refills at this time.  She will continue on this current treatment regimen.  She was advised to notify us if she develops increased joint pain or joint swelling.  She will follow-up in the office in 5 months.  Association of heart disease with psoriatic arthritis was discussed. Need to monitor blood pressure, cholesterol, and to exercise 30-60 minutes on daily basis was discussed.  She has been working with a nutritionist and walking for exercise on a regular basis.  She has started to lose weight.  She was able to discontinue taking an antacid due to dietary modifications.  Psoriasis - She has scattered patches of psoriasis.  No nail dystrophy or nail pitting at this time.    High risk medication use - Enbrel SureClick 50 mg every 7 days, methotrexate 0.7 mL's every 7 days, and folic acid 1 mg 2 tablets daily.  Last TB gold negative on 03/10/2018 and will monitor yearly.  Most recent CBC/CMP within normal limits on 10/30/2018 and will monitor every 3 months.  Standing orders placed.  She received the flu vaccine in October and previously Prevnar 13 and Pneumovax 23.    - Plan: COMPLETE METABOLIC PANEL WITH GFR, POE42+PNTI  Primary osteoarthritis of both hands -She has PIP and DIP synovial thickening consistent with osteoarthritis of bilateral hands.  She has complete fist formation bilaterally.  She has no tenderness or synovitis.  Joint protection muscle strengthening were discussed.  Primary osteoarthritis of both knees -She has no discomfort at this time.  No warmth or effusion noted.  She has good range of motion with no discomfort.  Vitamin D deficiency -She was  taking a vitamin D supplement.  Other medical conditions are listed as follows:   Essential hypertension   History of iron deficiency anemia   History of gastroesophageal reflux (GERD)   History of epilepsy   Orders: Orders Placed This Encounter  Procedures  . COMPLETE METABOLIC PANEL WITH GFR  . CMP14+EGFR  No orders of the defined types were placed in this encounter.   Face-to-face time spent with patient was 30 minutes. Greater than 50% of time was spent in counseling and coordination of care.  Follow-Up Instructions: Return in about 5 months (around 04/25/2019) for Psoriatic arthritis, Osteoarthritis.   Ofilia Neas, PA-C  Note - This record has been created using Dragon software.  Chart creation errors have been sought, but may not always  have been located. Such creation errors do not reflect on  the standard of medical care.

## 2018-11-17 ENCOUNTER — Other Ambulatory Visit: Payer: Self-pay | Admitting: Rheumatology

## 2018-11-17 NOTE — Telephone Encounter (Signed)
Last Visit: 04/20/2018 Next Visit: 11/23/2018 Labs: 10/30/2018 CBC and CMP WNL  Okay to refill per Dr. Estanislado Pandy.

## 2018-11-23 ENCOUNTER — Ambulatory Visit: Payer: BLUE CROSS/BLUE SHIELD | Admitting: Physician Assistant

## 2018-11-23 ENCOUNTER — Encounter: Payer: Self-pay | Admitting: Physician Assistant

## 2018-11-23 ENCOUNTER — Other Ambulatory Visit: Payer: Self-pay

## 2018-11-23 VITALS — BP 109/73 | HR 54 | Resp 11 | Ht 67.0 in | Wt 205.0 lb

## 2018-11-23 DIAGNOSIS — M170 Bilateral primary osteoarthritis of knee: Secondary | ICD-10-CM

## 2018-11-23 DIAGNOSIS — Z79899 Other long term (current) drug therapy: Secondary | ICD-10-CM | POA: Diagnosis not present

## 2018-11-23 DIAGNOSIS — E559 Vitamin D deficiency, unspecified: Secondary | ICD-10-CM

## 2018-11-23 DIAGNOSIS — L4050 Arthropathic psoriasis, unspecified: Secondary | ICD-10-CM | POA: Diagnosis not present

## 2018-11-23 DIAGNOSIS — I10 Essential (primary) hypertension: Secondary | ICD-10-CM

## 2018-11-23 DIAGNOSIS — Z862 Personal history of diseases of the blood and blood-forming organs and certain disorders involving the immune mechanism: Secondary | ICD-10-CM

## 2018-11-23 DIAGNOSIS — Z8719 Personal history of other diseases of the digestive system: Secondary | ICD-10-CM

## 2018-11-23 DIAGNOSIS — M19041 Primary osteoarthritis, right hand: Secondary | ICD-10-CM | POA: Diagnosis not present

## 2018-11-23 DIAGNOSIS — M19042 Primary osteoarthritis, left hand: Secondary | ICD-10-CM

## 2018-11-23 DIAGNOSIS — L409 Psoriasis, unspecified: Secondary | ICD-10-CM

## 2018-11-23 DIAGNOSIS — Z8669 Personal history of other diseases of the nervous system and sense organs: Secondary | ICD-10-CM

## 2018-11-23 NOTE — Patient Instructions (Addendum)
Standing Labs We placed an order today for your standing lab work.    Please come back and get your standing labs in August and every 3 months   We have open lab daily Monday through Thursday from 8:30-12:30 PM and 1:30-4:30 PM and Friday from 8:30-12:30 PM and 1:30 -4:00 PM at the office of Dr. Bo Merino.   You may experience shorter wait times on Monday and Friday afternoons. The office is located at [address], [place], [place], [place] [address] No appointment is necessary.   Labs are drawn by [facility].  You may receive a bill from [facility] for your lab work.  If you wish to have your labs drawn at another location, please call the office 24 hours in advance to send orders.  If you have any questions regarding directions or hours of operation,  please call [phone].   Just as a reminder please drink plenty of water prior to coming for your lab work. Thanks!   Hip Exercises Ask your health care provider which exercises are safe for you. Do exercises exactly as told by your health care provider and adjust them as directed. It is normal to feel mild stretching, pulling, tightness, or discomfort as you do these exercises, but you should stop right away if you feel sudden pain or your pain gets worse.Do not begin these exercises until told by your health care provider. Stretching and range of motion exercises These exercises warm up your muscles and joints and improve the movement and flexibility of your hip. These exercises also help to relieve pain, numbness, and tingling. Exercise A: Hamstrings, supine  1. Lie on your back. 2. Loop a belt or towel over the ball of your left / rightfoot. The ball of your foot is on the walking surface, right under your toes. 3. Straighten your left / rightknee and slowly pull on the belt to raise your leg. ? Do not let your left / right knee bend while you do this. ? Keep your other leg flat on the floor. ? Raise the left / right leg  until you feel a gentle stretch behind your left / right knee or thigh. 4. Hold this position for __________ seconds. 5. Slowly return your leg to the starting position. Repeat __________ times. Complete this stretch __________ times a day. Exercise B: Hip rotators  1. Lie on your back on a firm surface. 2. Hold your left / right knee with your left / right hand. Hold your ankle with your other hand. 3. Gently pull your left / right knee and rotate your lower leg toward your other shoulder. ? Pull until you feel a stretch in your buttocks. ? Keep your hips and shoulders firmly planted while you do this stretch. 4. Hold this position for __________ seconds. Repeat __________ times. Complete this stretch __________ times a day. Exercise C: V-sit (hamstrings and adductors)  1. Sit on the floor with your legs extended in a large "V" shape. Keep your knees straight during this exercise. 2. Start with your head and chest upright, then bend at your waist to reach for your left foot (position A). You should feel a stretch in your right inner thigh. 3. Hold this position for __________ seconds. Then slowly return to the upright position. 4. Bend at your waist to reach forward (position B). You should feel a stretch behind both of your thighs and knees. 5. Hold this position for __________ seconds. Then slowly return to the upright position. 6.  Bend at your waist to reach for your right foot (position C). You should feel a stretch in your left inner thigh. 7. Hold this position for __________ seconds. Then slowly return to the upright position. Repeat __________ times. Complete this stretch __________ times a day. Exercise D: Lunge (hip flexors)  1. Place your left / right knee on the floor and bend your other knee so that is directly over your ankle. You should be half-kneeling. 2. Keep good posture with your head over your shoulders. 3. Tighten your buttocks to point your tailbone downward. This  helps your back to keep from arching too much. 4. You should feel a gentle stretch in the front of your left / right thigh and hip. If you do not feel any resistance, slightly slide your other foot forward and then slowly lunge forward so your knee once again lines up over your ankle. 5. Make sure your tailbone continues to point downward. 6. Hold this position for __________ seconds. Repeat __________ times. Complete this stretch __________ times a day. Strengthening exercises These exercises build strength and endurance in your hip. Endurance is the ability to use your muscles for a long time, even after they get tired. Exercise E: Bridge (hip extensors)  1. Lie on your back on a firm surface with your knees bent and your feet flat on the floor. 2. Tighten your buttocks muscles and lift your bottom off the floor until the trunk of your body is level with your thighs. ? Do not arch your back. ? You should feel the muscles working in your buttocks and the back of your thighs. If you do not feel these muscles, slide your feet 1-2 inches (2.5-5 cm) farther away from your buttocks. 3. Hold this position for __________ seconds. 4. Slowly lower your hips to the starting position. 5. Let your muscles relax completely between repetitions. 6. If this exercise is too easy, try doing it with your arms crossed over your chest. Repeat __________ times. Complete this exercise __________ times a day. Exercise F: Straight leg raises - hip abductors  1. Lie on your side with your left / right leg in the top position. Lie so your head, shoulder, knee, and hip line up with each other. You may bend your bottom knee to help you balance. 2. Roll your hips slightly forward, so your hips are stacked directly over each other and your left / right knee is facing forward. 3. Leading with your heel, lift your top leg 4-6 inches (10-15 cm). You should feel the muscles in your outer hip lifting. ? Do not let your foot  drift forward. ? Do not let your knee roll toward the ceiling. 4. Hold this position for __________ seconds. 5. Slowly return to the starting position. 6. Let your muscles relax completely between repetitions. Repeat __________ times. Complete this exercise __________ times a day. Exercise G: Straight leg raises - hip adductors  1. Lie on your side with your left / right leg in the bottom position. Lie so your head, shoulder, knee, and hip line up. You may place your upper foot in front to help you balance. 2. Roll your hips slightly forward, so your hips are stacked directly over each other and your left / right knee is facing forward. 3. Tense the muscles in your inner thigh and lift your bottom leg 4-6 inches (10-15 cm). 4. Hold this position for __________ seconds. 5. Slowly return to the starting position. 6. Let your muscles relax  completely between repetitions. Repeat __________ times. Complete this exercise __________ times a day. Exercise H: Straight leg raises - quadriceps  1. Lie on your back with your left / right leg extended and your other knee bent. 2. Tense the muscles in the front of your left / right thigh. When you do this, you should see your kneecap slide up or see increased dimpling just above your knee. 3. Tighten these muscles even more and raise your leg 4-6 inches (10-15 cm) off the floor. 4. Hold this position for __________ seconds. 5. Keep these muscles tense as you lower your leg. 6. Relax the muscles slowly and completely between repetitions. Repeat __________ times. Complete this exercise __________ times a day. Exercise I: Hip abductors, standing 1. Tie one end of a rubber exercise band or tubing to a secure surface, such as a table or pole. 2. Loop the other end of the band or tubing around your left / right ankle. 3. Keeping your ankle with the band or tubing directly opposite of the secured end, step away until there is tension in the tubing or band.  Hold onto a chair as needed for balance. 4. Lift your left / right leg out to your side. While you do this: ? Keep your back upright. ? Keep your shoulders over your hips. ? Keep your toes pointing forward. ? Make sure to use your hip muscles to lift your leg. Do not "throw" your leg or tip your body to lift your leg. 5. Hold this position for __________ seconds. 6. Slowly return to the starting position. Repeat __________ times. Complete this exercise __________ times a day. Exercise J: Squats (quadriceps) 1. Stand in a door frame so your feet and knees are in line with the frame. You may place your hands on the frame for balance. 2. Slowly bend your knees and lower your hips like you are going to sit in a chair. ? Keep your lower legs in a straight-up-and-down position. ? Do not let your hips go lower than your knees. ? Do not bend your knees lower than told by your health care provider. ? If your hip pain increases, do not bend as low. 3. Hold this position for ___________ seconds. 4. Slowly push with your legs to return to standing. Do not use your hands to pull yourself to standing. Repeat __________ times. Complete this exercise __________ times a day. This information is not intended to replace advice given to you by your health care provider. Make sure you discuss any questions you have with your health care provider. Document Released: 06/14/2005 Document Revised: 09/30/2017 Document Reviewed: 05/22/2015 Elsevier Interactive Patient Education  2019 [employer].

## 2018-12-03 ENCOUNTER — Ambulatory Visit: Payer: BLUE CROSS/BLUE SHIELD | Admitting: Family Medicine

## 2018-12-04 LAB — HM MAMMOGRAPHY

## 2018-12-07 ENCOUNTER — Other Ambulatory Visit: Payer: Self-pay | Admitting: Rheumatology

## 2018-12-07 NOTE — Telephone Encounter (Signed)
Last Visit: 11/23/18 Next Visit: 04/26/19 Labs: 10/30/18 WNL TB Gold: 03/10/18 Neg   Okay to refill per Dr. Estanislado Pandy

## 2018-12-07 NOTE — Progress Notes (Signed)
Spoke to pt and advised of mammography results. [place]

## 2018-12-08 ENCOUNTER — Telehealth: Payer: Self-pay

## 2018-12-08 NOTE — Telephone Encounter (Signed)
Called pt to confirm appt in office 12-15-18 fasting CPE screening needed. [place]

## 2018-12-15 ENCOUNTER — Telehealth: Payer: Self-pay | Admitting: *Deleted

## 2018-12-15 ENCOUNTER — Ambulatory Visit: Payer: BLUE CROSS/BLUE SHIELD | Admitting: Family Medicine

## 2018-12-15 ENCOUNTER — Encounter: Payer: Self-pay | Admitting: Family Medicine

## 2018-12-15 ENCOUNTER — Other Ambulatory Visit: Payer: Self-pay

## 2018-12-15 VITALS — BP 110/72 | HR 50 | Temp 97.7°F | Ht 68.0 in | Wt 199.2 lb

## 2018-12-15 DIAGNOSIS — Z780 Asymptomatic menopausal state: Secondary | ICD-10-CM

## 2018-12-15 DIAGNOSIS — Z1211 Encounter for screening for malignant neoplasm of colon: Secondary | ICD-10-CM | POA: Diagnosis not present

## 2018-12-15 DIAGNOSIS — E669 Obesity, unspecified: Secondary | ICD-10-CM

## 2018-12-15 DIAGNOSIS — G40909 Epilepsy, unspecified, not intractable, without status epilepticus: Secondary | ICD-10-CM

## 2018-12-15 DIAGNOSIS — I10 Essential (primary) hypertension: Secondary | ICD-10-CM

## 2018-12-15 DIAGNOSIS — L4050 Arthropathic psoriasis, unspecified: Secondary | ICD-10-CM

## 2018-12-15 DIAGNOSIS — Z23 Encounter for immunization: Secondary | ICD-10-CM

## 2018-12-15 DIAGNOSIS — Z8639 Personal history of other endocrine, nutritional and metabolic disease: Secondary | ICD-10-CM

## 2018-12-15 DIAGNOSIS — Z8719 Personal history of other diseases of the digestive system: Secondary | ICD-10-CM

## 2018-12-15 DIAGNOSIS — L409 Psoriasis, unspecified: Secondary | ICD-10-CM

## 2018-12-15 DIAGNOSIS — Z0000 Encounter for general adult medical examination without abnormal findings: Secondary | ICD-10-CM | POA: Diagnosis not present

## 2018-12-15 DIAGNOSIS — Z862 Personal history of diseases of the blood and blood-forming organs and certain disorders involving the immune mechanism: Secondary | ICD-10-CM

## 2018-12-15 DIAGNOSIS — Z79899 Other long term (current) drug therapy: Secondary | ICD-10-CM

## 2018-12-15 MED ORDER — HYDROCHLOROTHIAZIDE 12.5 MG PO CAPS: 12.5000 mg | ORAL_CAPSULE | Freq: Every day | ORAL | 1 refills | Status: DC

## 2018-12-15 MED ORDER — LOSARTAN POTASSIUM 50 MG PO TABS: 50.0000 mg | ORAL_TABLET | Freq: Every day | ORAL | 1 refills | Status: DC

## 2018-12-15 MED ORDER — DILANTIN 100 MG PO CAPS: 200.0000 mg | ORAL_CAPSULE | Freq: Two times a day (BID) | ORAL | 3 refills | Status: DC

## 2018-12-15 NOTE — Telephone Encounter (Signed)
[facility] contacted the office to see if patient could receive the shingles vaccination since patient is on Enbrel and MTX. Advised that patient may have the Singrix vaccine as it is not a live virus.

## 2018-12-15 NOTE — Progress Notes (Signed)
Subjective:    Patient ID: Kimberly Butler, female    DOB: Nov 04, 1955, 63 y.o.   MRN: [id number]  HPI She is here for complete examination.  She does have an underlying seizure disorder and is very stable on her Dilantin.  She did have a recent test which was low normal.  She continues on a DMARD for her psoriatic arthritis and gets routine follow-up on that.  She changed her diet around and is no longer having reflux type symptoms.  She continues on HCTZ and losartan for her blood pressure.  She also has a history of vitamin D deficiency as well as iron deficiency and gets follow-up on that.  Her exercise level is minimal.  She does need follow-up concerning colonoscopy.  Pap smear was offered however she is not interested.  She is postmenopausal.  She has had a recent mammogram. Otherwise her family and social history as well as health maintenance and immunizations was reviewed  Review of Systems  All other systems reviewed and are negative.      Objective:   Physical Exam BP 110/72 (BP Location: Left Arm, Patient Position: Sitting)   Pulse (!) 50   Temp 97.7 F (36.5 C)   Ht 5\' 8"  (1.727 m)   Wt 199 lb 3.2 oz (90.4 kg)   SpO2 97%   BMI 30.29 kg/m   General Appearance:    Alert, cooperative, no distress, appears stated age  Head:    Normocephalic, without obvious abnormality, atraumatic  Eyes:    PERRL, conjunctiva/corneas clear, EOM's intact, fundi    benign  Ears:    Normal TM's and external ear canals  Nose:   Nares normal, mucosa normal, no drainage or sinus   tenderness  Throat:   Lips, mucosa, and tongue normal; teeth and gums normal  Neck:   Supple, no lymphadenopathy;  thyroid:  no   enlargement/tenderness/nodules; no carotid   bruit or JVD     Lungs:     Clear to auscultation bilaterally without wheezes, rales or     ronchi; respirations unlabored      Heart:    Regular rate and rhythm, S1 and S2 normal, no murmur, rub   or gallop  Breast Exam:    Deferred to GYN   Abdomen:     Soft, non-tender, nondistended, normoactive bowel sounds,    no masses, no hepatosplenomegaly  Genitalia:    Deferred to GYN     Extremities:   No clubbing, cyanosis or edema  Pulses:   2+ and symmetric all extremities  Skin:   Skin color, texture, turgor normal, no rashes or lesions  Lymph nodes:   Cervical, supraclavicular, and axillary nodes normal  Neurologic:   CNII-XII intact, normal strength, sensation and gait; reflexes 2+ and symmetric throughout          Psych:   Normal mood, affect, hygiene and grooming.          Assessment & Plan:   Encounter Diagnoses  Name Primary?  . Screening for colon cancer   . Need for shingles vaccine   . Seizure disorder ([place])   . Psoriatic arthritis ([place])   . Psoriasis   . Post-menopausal   . Obesity (BMI 30-39.9)   . Essential hypertension   . History of iron deficiency anemia   . History of vitamin D deficiency   . History of gastroesophageal reflux (GERD)   . High risk medication use   . Routine general medical examination at a  health care facility Yes  I will follow-up on her lipids.  She also will be started on Shingrix and return here in 3 months.  She is to continue on her present medications and follow-up with Dr. Estanislado Pandy concerning her psoriatic arthritis.  Continue on Dilantin.  Her medications were renewed.

## 2018-12-16 ENCOUNTER — Telehealth: Payer: Self-pay | Admitting: Family Medicine

## 2018-12-16 LAB — LIPID PANEL
Chol/HDL Ratio: 2.3 ratio (ref 0.0–4.4)
Cholesterol, Total: 195 mg/dL (ref 100–199)
HDL: 83 mg/dL (ref >39)
LDL Calculated: 97 mg/dL (ref 0–99)
Triglycerides: 76 mg/dL (ref 0–149)
VLDL Cholesterol Cal: 15 mg/dL (ref 5–40)

## 2018-12-16 NOTE — Telephone Encounter (Signed)
Recv'd fax requesting refill from [facility] so I called pt and and she needs Rx at local pharmacy not at mail order,   I called and switched

## 2018-12-24 LAB — COLOGUARD: Cologuard: NEGATIVE

## 2018-12-31 NOTE — Progress Notes (Signed)
Pt was called with results [facility]

## 2019-02-14 ENCOUNTER — Other Ambulatory Visit: Payer: Self-pay | Admitting: Rheumatology

## 2019-02-16 NOTE — Telephone Encounter (Signed)
ok 

## 2019-02-16 NOTE — Telephone Encounter (Addendum)
Last Visit: 11/23/18 Next Visit: 04/26/19 Labs: 10/30/18 WNL  Left message to advise pateint she is due to update labs.   Okay to refill 30 day supply MTX?

## 2019-02-19 ENCOUNTER — Telehealth: Payer: Self-pay | Admitting: Rheumatology

## 2019-02-19 DIAGNOSIS — Z9225 Personal history of immunosupression therapy: Secondary | ICD-10-CM

## 2019-02-19 DIAGNOSIS — Z79899 Other long term (current) drug therapy: Secondary | ICD-10-CM

## 2019-02-19 NOTE — Telephone Encounter (Signed)
Lab orders released.  

## 2019-02-19 NOTE — Telephone Encounter (Signed)
Patient request for lab orders to be released to [facility]. Patient will go on Monday.

## 2019-02-23 NOTE — Telephone Encounter (Signed)
BUN/creatinine ratio is elevated-32.  Rest of CMP WNL.  We will continue to monitor.

## 2019-02-24 LAB — CMP14+EGFR
ALT: 17 IU/L (ref 0–32)
AST: 27 IU/L (ref 0–40)
Albumin/Globulin Ratio: 1.7 (ref 1.2–2.2)
Albumin: 4.5 g/dL (ref 3.8–4.8)
Alkaline Phosphatase: 94 IU/L (ref 39–117)
BUN/Creatinine Ratio: 32 — ABNORMAL HIGH (ref 12–28)
BUN: 21 mg/dL (ref 8–27)
Bilirubin Total: 0.3 mg/dL (ref 0.0–1.2)
CO2: 25 mmol/L (ref 20–29)
Calcium: 9.3 mg/dL (ref 8.7–10.3)
Chloride: 104 mmol/L (ref 96–106)
Creatinine, Ser: 0.65 mg/dL (ref 0.57–1.00)
GFR calc Af Amer: 109 mL/min/{1.73_m2} (ref >59)
GFR calc non Af Amer: 95 mL/min/{1.73_m2} (ref >59)
Globulin, Total: 2.6 g/dL (ref 1.5–4.5)
Glucose: 88 mg/dL (ref 65–99)
Potassium: 4.3 mmol/L (ref 3.5–5.2)
Sodium: 141 mmol/L (ref 134–144)
Total Protein: 7.1 g/dL (ref 6.0–8.5)

## 2019-02-24 LAB — QUANTIFERON-TB GOLD PLUS
QuantiFERON Mitogen Value: >10 IU/mL
QuantiFERON Nil Value: 0.02 IU/mL
QuantiFERON TB1 Ag Value: 0.02 IU/mL
QuantiFERON TB2 Ag Value: 0.02 IU/mL
QuantiFERON-TB Gold Plus: NEGATIVE

## 2019-02-24 LAB — CBC WITH DIFFERENTIAL/PLATELET
Basophils Absolute: 0 10*3/uL (ref 0.0–0.2)
Basos: 1 %
EOS (ABSOLUTE): 0.1 10*3/uL (ref 0.0–0.4)
Eos: 2 %
Hematocrit: 34.7 % (ref 34.0–46.6)
Hemoglobin: 11.8 g/dL (ref 11.1–15.9)
Immature Grans (Abs): 0 10*3/uL (ref 0.0–0.1)
Immature Granulocytes: 0 %
Lymphocytes Absolute: 2.6 10*3/uL (ref 0.7–3.1)
Lymphs: 40 %
MCH: 31.5 pg (ref 26.6–33.0)
MCHC: 34 g/dL (ref 31.5–35.7)
MCV: 93 fL (ref 79–97)
Monocytes Absolute: 0.6 10*3/uL (ref 0.1–0.9)
Monocytes: 9 %
Neutrophils Absolute: 3.2 10*3/uL (ref 1.4–7.0)
Neutrophils: 48 %
Platelets: 223 10*3/uL (ref 150–450)
RBC: 3.75 x10E6/uL — ABNORMAL LOW (ref 3.77–5.28)
RDW: 12.5 % (ref 11.7–15.4)
WBC: 6.6 10*3/uL (ref 3.4–10.8)

## 2019-03-13 ENCOUNTER — Other Ambulatory Visit: Payer: Self-pay | Admitting: Rheumatology

## 2019-03-15 NOTE — Telephone Encounter (Signed)
Last Visit: 11/23/18 Next Visit: 04/26/19 Labs: 02/22/19 BUN/Creatinine Ratio is elevated-32. Rest of CMP WNL.  Okay to refill per Dr. Estanislado Pandy

## 2019-03-22 ENCOUNTER — Other Ambulatory Visit: Payer: Self-pay

## 2019-03-22 ENCOUNTER — Ambulatory Visit: Payer: BLUE CROSS/BLUE SHIELD | Admitting: Family Medicine

## 2019-03-22 ENCOUNTER — Encounter: Payer: Self-pay | Admitting: Family Medicine

## 2019-03-22 VITALS — BP 102/68 | HR 53 | Temp 98.2°F | Wt 199.4 lb

## 2019-03-22 DIAGNOSIS — E669 Obesity, unspecified: Secondary | ICD-10-CM

## 2019-03-22 DIAGNOSIS — Z23 Encounter for immunization: Secondary | ICD-10-CM

## 2019-03-22 NOTE — Progress Notes (Signed)
   Subjective:    Patient ID: Kimberly Butler, female    DOB: 16-Jun-1955, 63 y.o.   MRN: [id number]  HPI She is here for her second shingles shot.  She also will need influenza.  Over the last several months she has started on a dietary program through a nutritionist and states that she has lost several pounds.  She is very happy over this.   Review of Systems     Objective:   Physical Exam Alert and in no distress otherwise not examined      Assessment & Plan:  Obesity (BMI 30-39.9)  Need for influenza vaccination - Plan: Flu Vaccine QUAD 6+ mos PF IM (Fluarix Quad PF)  Need for shingles vaccine - Plan: Varicella-zoster vaccine IM (Shingrix) Immunizations were given. I then discussed her weight in regard to lifestyle changes.  Strongly encouraged her to make permanent lifestyle changes in regard to her diet and exercise.  She seems very committed to doing this long-term.  Also expressed the need for her to get rid of her overweight clothes with the idea of not letting her go back into them.

## 2019-04-12 NOTE — Progress Notes (Deleted)
Office Visit Note  Patient: Kimberly Butler             Date of Birth: Jul 18, 1955           MRN: [id number]             PCP: Denita Lung, MD Referring: Denita Lung, MD Visit Date: 04/26/2019 Occupation: @GUAROCC @  Subjective:  No chief complaint on file.   History of Present Illness: Kimberly Butler is a 63 y.o. female ***   Activities of Daily Living:  Patient reports morning stiffness for *** {minute/hour:19697}.   Patient {ACTIONS;DENIES/REPORTS:21021675::"Denies"} nocturnal pain.  Difficulty dressing/grooming: {ACTIONS;DENIES/REPORTS:21021675::"Denies"} Difficulty climbing stairs: {ACTIONS;DENIES/REPORTS:21021675::"Denies"} Difficulty getting out of chair: {ACTIONS;DENIES/REPORTS:21021675::"Denies"} Difficulty using hands for taps, buttons, cutlery, and/or writing: {ACTIONS;DENIES/REPORTS:21021675::"Denies"}  No Rheumatology ROS completed.   PMFS History:  Patient Active Problem List   Diagnosis Date Noted  . Lactose intolerance in adult 11/18/2016  . Post-menopausal 08/28/2016  . High risk medication use 08/28/2016  . History of vitamin D deficiency 08/28/2016  . History of gastroesophageal reflux (GERD) 08/28/2016  . History of iron deficiency anemia 08/28/2016  . Psoriasis 07/07/2014  . Seizure disorder ([place]) 01/07/2011  . Hypertension 01/07/2011  . Psoriatic arthritis ([place]) 01/07/2011  . Allergic rhinitis due to pollen 01/07/2011  . Obesity (BMI 30-39.9) 01/07/2011    Past Medical History:  Diagnosis Date  . Allergy    RHINITIS  . 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 Epilepsy ([place])    hasn't had seizure in 20 years (01/15/17)  . GERD (gastroesophageal reflux disease)   . Hypertension   . Obesity   . Psoriasis   . Psoriatic arthritis ([place])   . Smoker   . Varicose veins     Family History  Problem Relation Age of Onset  . Cancer Mother   . Heart disease Mother   . Arthritis Father   . Kidney failure Father   . Dementia Father   . Diabetes  Father   . Cancer Sister   . Cancer Brother   . Healthy Son    Past Surgical History:  Procedure Laterality Date  . CATARACT EXTRACTION W/ INTRAOCULAR LENS IMPLANT Bilateral   . CESAREAN SECTION    . CHOLECYSTECTOMY N/A 01/23/2017   Procedure: LAPAROSCOPIC CHOLECYSTECTOMY;  Surgeon: Georganna Skeans, MD;  Location: [facility];  Service: General;  Laterality: N/A;  . TONSILECTOMY, ADENOIDECTOMY, BILATERAL MYRINGOTOMY AND TUBES    . TUBAL LIGATION     Social History   Social History Narrative  . Not on file   Immunization History  Administered Date(s) Administered  . DTaP 02/06/1994, 11/15/2004  . Influenza Inj Mdck Quad Pf 03/10/2017  . Influenza Whole 07/24/2009  . Influenza,inj,Quad PF,6+ Mos 03/29/2016, 03/24/2018, 03/22/2019  . Influenza-Unspecified 03/10/2014, 03/10/2017  . PPD Test 10/20/1992  . Pneumococcal Conjugate-13 12/18/2007  . Pneumococcal Polysaccharide-23 03/31/2014  . Tdap 03/29/2016  . Zoster Recombinat (Shingrix) 12/15/2018, 03/22/2019     Objective: Vital Signs: There were no vitals taken for this visit.   Physical Exam   Musculoskeletal Exam: ***  CDAI Exam: CDAI Score: - Patient Global: -; Provider Global: - Swollen: -; Tender: - Joint Exam   No joint exam has been documented for this visit   There is currently no information documented on the homunculus. Go to the Rheumatology activity and complete the homunculus joint exam.  Investigation: No additional findings.  Imaging: No results found.  Recent Labs: Lab Results  Component Value Date  WBC 6.6 02/22/2019   HGB 11.8 02/22/2019   PLT 223 02/22/2019   NA 141 02/22/2019   K 4.3 02/22/2019   CL 104 02/22/2019   CO2 25 02/22/2019   GLUCOSE 88 02/22/2019   BUN 21 02/22/2019   CREATININE 0.65 02/22/2019   BILITOT 0.3 02/22/2019   ALKPHOS 94 02/22/2019   AST 27 02/22/2019   ALT 17 02/22/2019   PROT 7.1 02/22/2019   ALBUMIN 4.5 02/22/2019   CALCIUM 9.3 02/22/2019   GFRAA 109  02/22/2019   QFTBGOLDPLUS Negative 02/22/2019    Speciality Comments: No specialty comments available.  Procedures:  No procedures performed Allergies: No known allergies   Assessment / Plan:     Visit Diagnoses: No diagnosis found.  Orders: No orders of the defined types were placed in this encounter.  No orders of the defined types were placed in this encounter.   Face-to-face time spent with patient was *** minutes. Greater than 50% of time was spent in counseling and coordination of care.  Follow-Up Instructions: No follow-ups on file.   Ofilia Neas, PA-C  Note - This record has been created using Dragon software.  Chart creation errors have been sought, but may not always  have been located. Such creation errors do not reflect on  the standard of medical care.

## 2019-04-15 ENCOUNTER — Telehealth: Payer: Self-pay | Admitting: Rheumatology

## 2019-04-15 NOTE — Telephone Encounter (Signed)
Returned call to patient left message with phone number to contact the billing department concerning statement she received for $173.29

## 2019-04-22 ENCOUNTER — Telehealth: Payer: Self-pay | Admitting: Family Medicine

## 2019-04-22 NOTE — Telephone Encounter (Signed)
Called LM for pt. To call back to [facility]ifice if she needs to but these issues need to be handled by the specialists.

## 2019-04-22 NOTE — Telephone Encounter (Signed)
Pt called and wanted to know do you hand Enbrel and Methotrexate. She said seeing the specialist for it is expensive so she wanted to check with her primary to see if she can just get it managed with you

## 2019-04-22 NOTE — Telephone Encounter (Signed)
This needs to be handled by the specialist

## 2019-04-26 ENCOUNTER — Ambulatory Visit: Payer: BLUE CROSS/BLUE SHIELD | Admitting: Physician Assistant

## 2019-04-26 ENCOUNTER — Other Ambulatory Visit: Payer: Self-pay | Admitting: Physician Assistant

## 2019-04-26 NOTE — Telephone Encounter (Signed)
Last Visit: 11/23/18 Next Visit:06/14/19 Labs: 02/22/19 BUN/Creatinine Ratio is elevated-32. Rest of CMP WNL.  Okay to refill per Dr. Estanislado Pandy

## 2019-05-16 IMAGING — US US ABDOMEN LIMITED
1 series · 14 of 25 positions shown · non-contrast
Comparison: None.

CLINICAL DATA: Abdominal pain and bloating

EXAM:
ULTRASOUND ABDOMEN LIMITED RIGHT UPPER QUADRANT

[Series 1: us abdomen limited · 0.18mm/px · 14 of 49 slices shown]
[im 1/49]
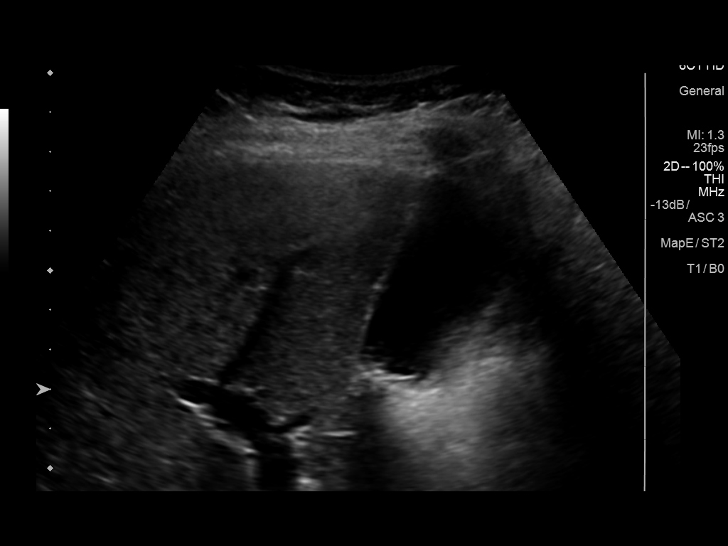
[im 5/49]
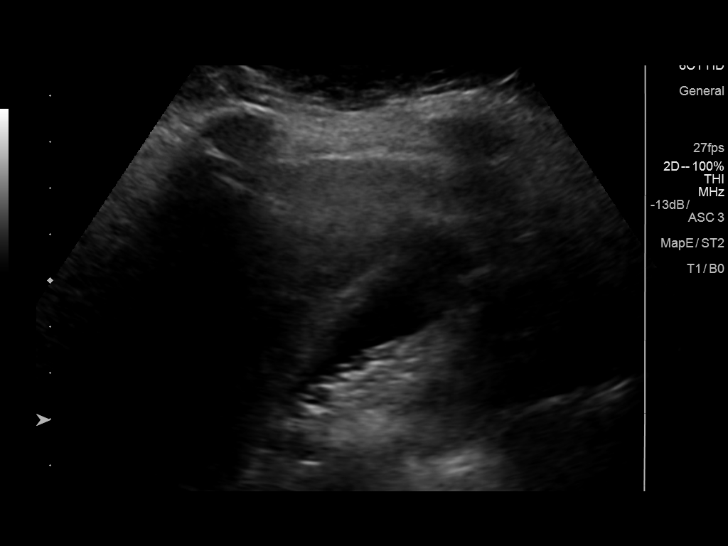
[im 9/49]
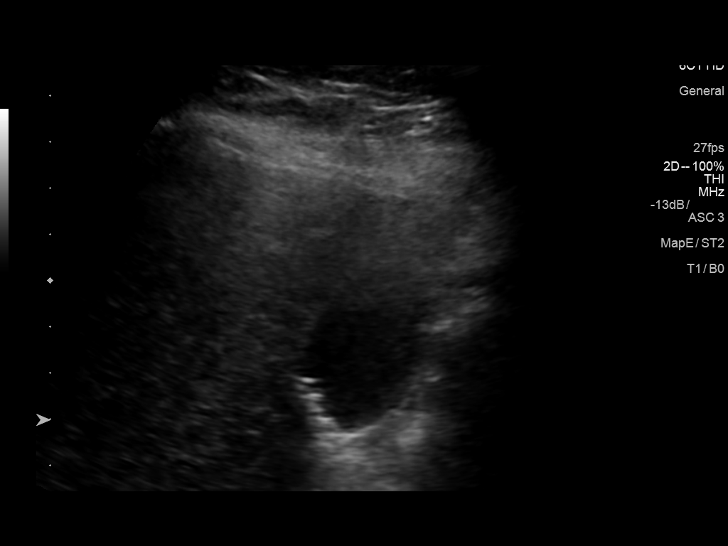
[im 13/49]
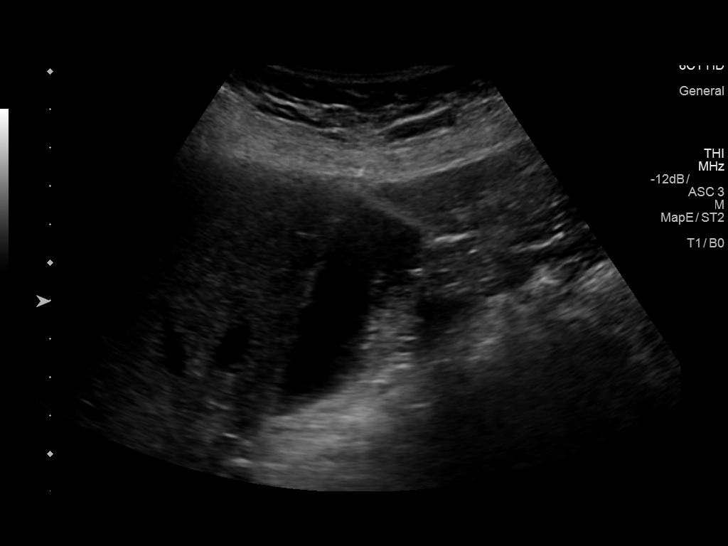
[im 17/49]
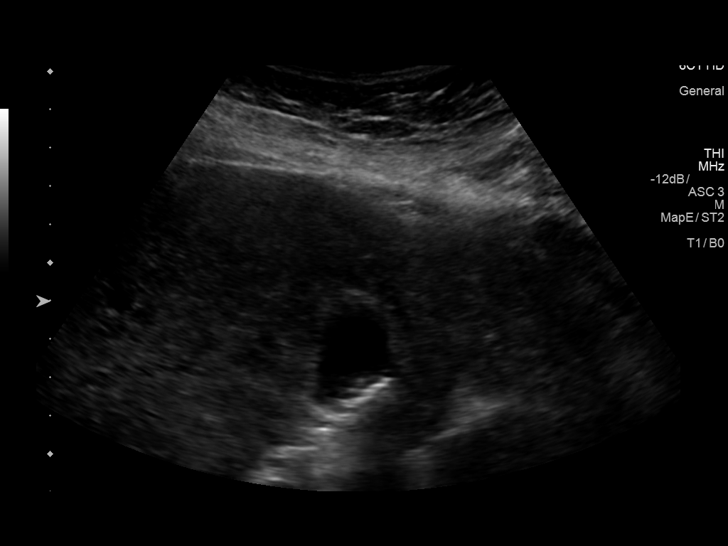
[im 19/49]
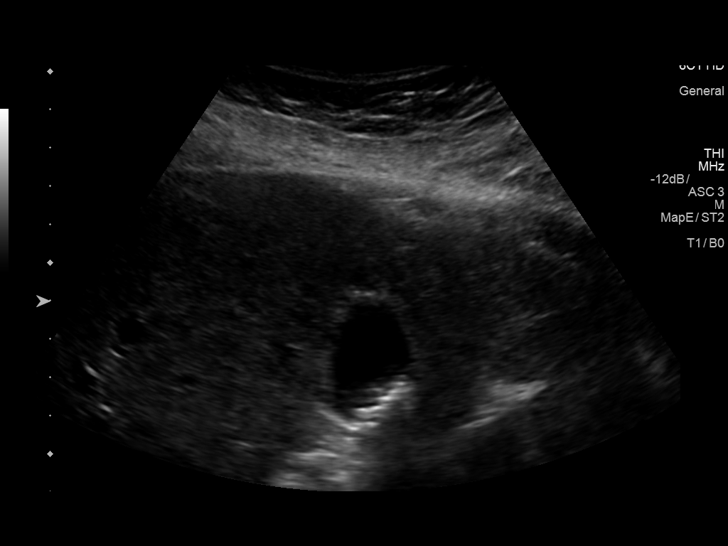
[im 23/49]
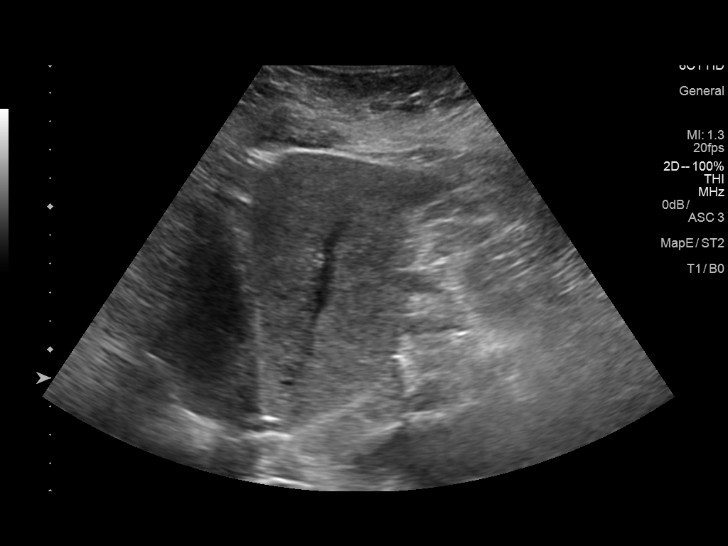
[im 27/49]
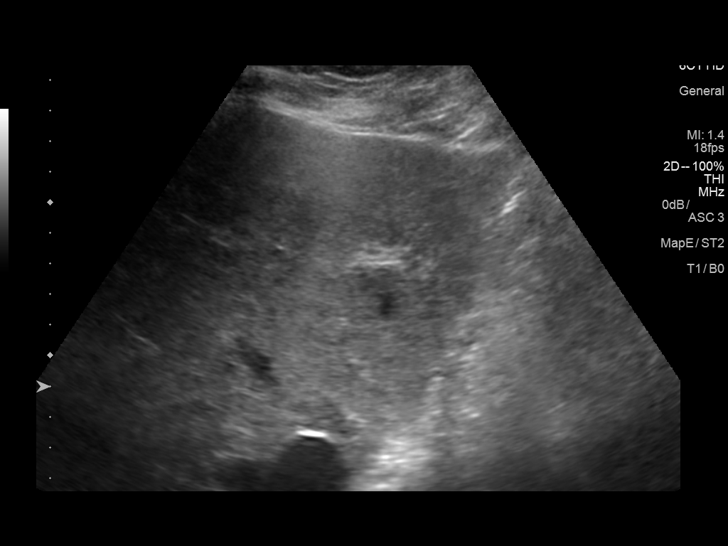
[im 31/49]
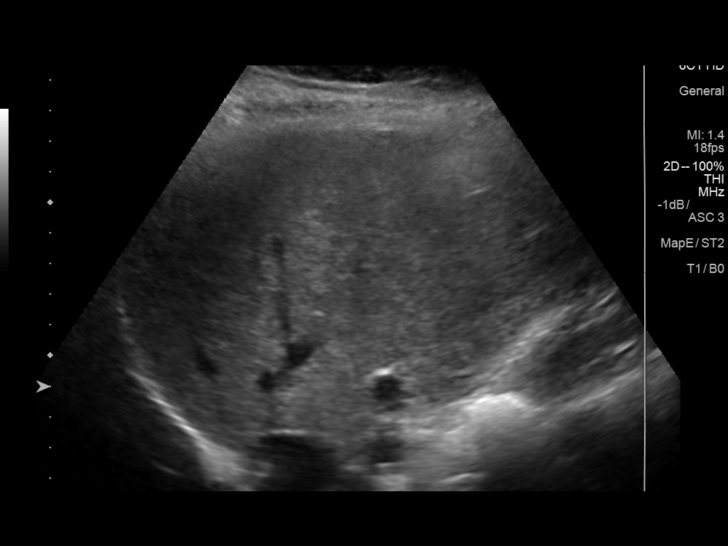
[im 33/49]
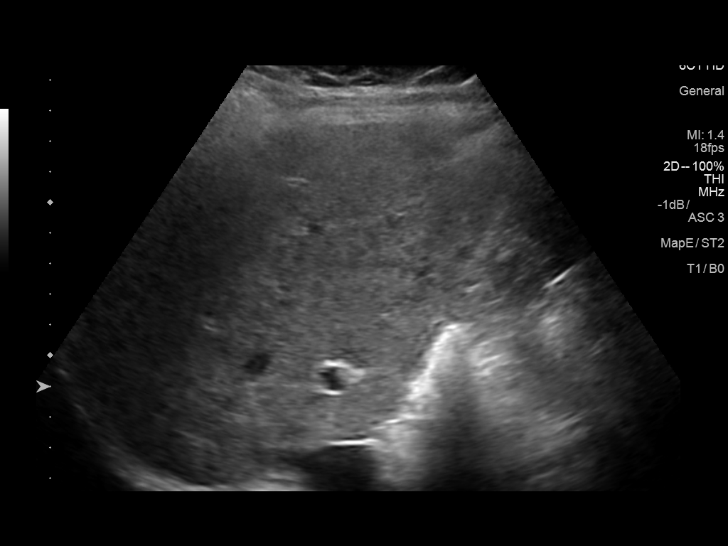
[im 37/49]
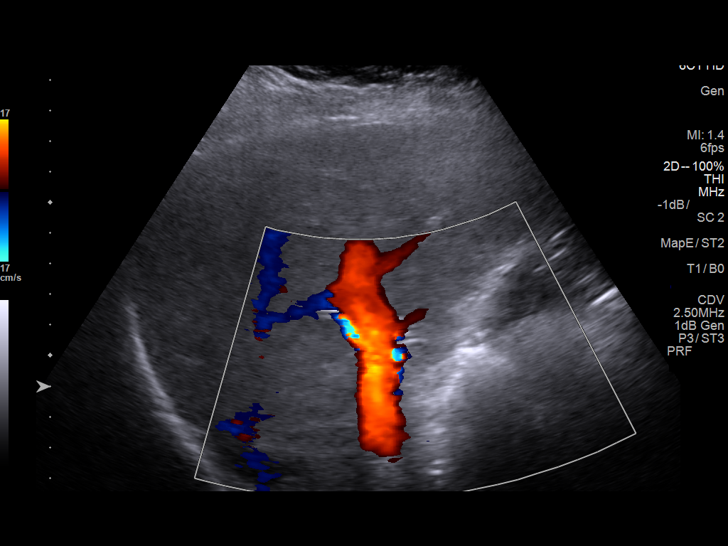
[im 41/49]
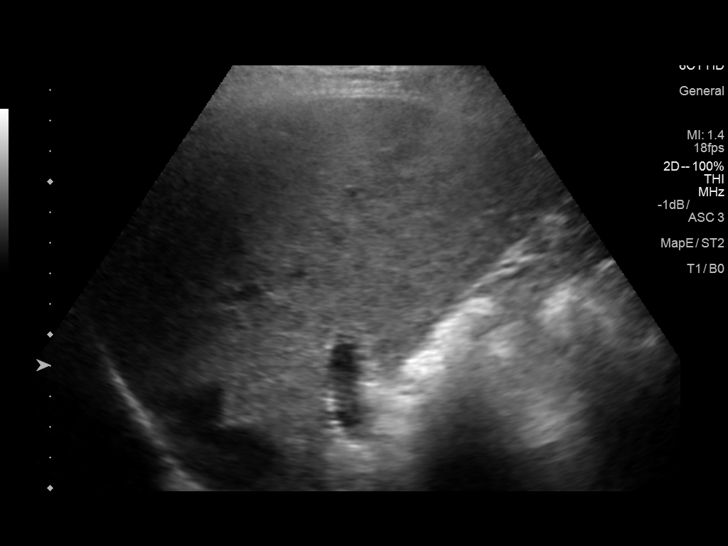
[im 45/49]
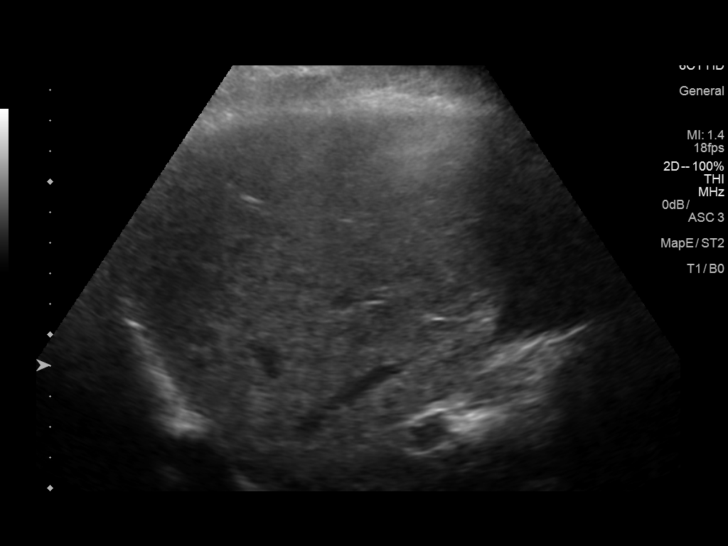
[im 49/49]
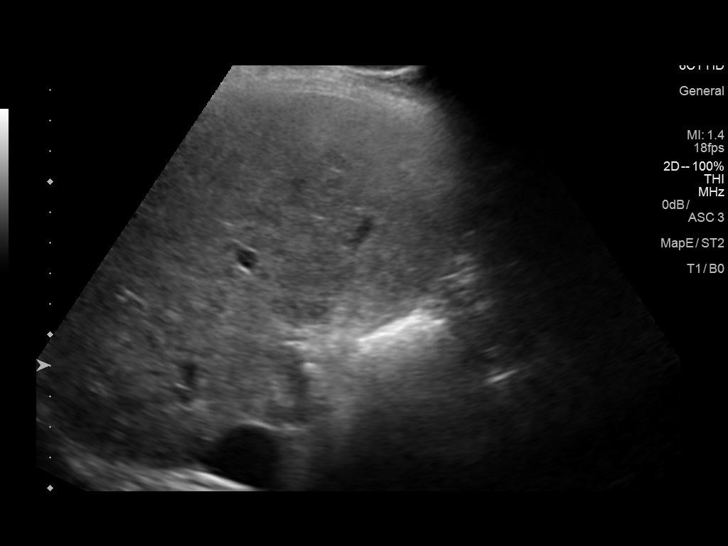

[14 of 25 positions shown; findings below may reference images not displayed]

FINDINGS: Gallbladder:

Within the gallbladder, there are multiple echogenic foci which move
and shadow consistent with cholelithiasis. Largest gallstone
measures 1.0 cm in length. Gallbladder wall appears slightly
thickened and edematous. No pericholecystic fluid. Patient is
focally tender over the gallbladder.

Common bile duct:

Diameter: 3 mm. No intrahepatic or extrahepatic biliary duct
dilatation.

Liver:

No focal lesion identified. Within normal limits in parenchymal
echogenicity. Flow in the portal vein is in the anatomic direction.
IMPRESSION: Cholelithiasis with evidence of edema in portions of the gallbladder
wall. Patient is focally tender over the gallbladder. These findings
are concerning for a degree of acute cholecystitis.

Study otherwise unremarkable.

These results will be called to the ordering clinician or
representative by the Radiologist Assistant, and communication
documented in the PACS or zVision Dashboard.

## 2019-06-09 ENCOUNTER — Other Ambulatory Visit: Payer: Self-pay | Admitting: Rheumatology

## 2019-06-09 DIAGNOSIS — Z79899 Other long term (current) drug therapy: Secondary | ICD-10-CM

## 2019-06-09 NOTE — Telephone Encounter (Signed)
Last Visit: 11/23/2018 Next Visit: 07/19/2019 Labs: 02/22/2019 RBC count is borderline low. Rest of CBC WNL. We will continue to monitor. TB Gold:  02/22/2019 negative   Advised patient she is due update labs, patient verbalized understanding and will go [facility] today. Orders have been released.   enbrel last filled on 12/07/2018 for a 90 day supply. Patient states she has been injecting on schedule and has not missed any doses of enbrel.   Okay to refill 30 day supply, per Dr. Estanislado Pandy.

## 2019-06-10 LAB — CMP14+EGFR
ALT: 17 IU/L (ref 0–32)
AST: 23 IU/L (ref 0–40)
Albumin/Globulin Ratio: 1.4 (ref 1.2–2.2)
Albumin: 4.2 g/dL (ref 3.8–4.8)
Alkaline Phosphatase: 92 IU/L (ref 39–117)
BUN/Creatinine Ratio: 29 — ABNORMAL HIGH (ref 12–28)
BUN: 20 mg/dL (ref 8–27)
Bilirubin Total: <0.2 mg/dL (ref 0.0–1.2)
CO2: 26 mmol/L (ref 20–29)
Calcium: 10.1 mg/dL (ref 8.7–10.3)
Chloride: 102 mmol/L (ref 96–106)
Creatinine, Ser: 0.7 mg/dL (ref 0.57–1.00)
GFR calc Af Amer: 107 mL/min/{1.73_m2} (ref >59)
GFR calc non Af Amer: 93 mL/min/{1.73_m2} (ref >59)
Globulin, Total: 3 g/dL (ref 1.5–4.5)
Glucose: 119 mg/dL — ABNORMAL HIGH (ref 65–99)
Potassium: 4.3 mmol/L (ref 3.5–5.2)
Sodium: 141 mmol/L (ref 134–144)
Total Protein: 7.2 g/dL (ref 6.0–8.5)

## 2019-06-10 LAB — CBC WITH DIFFERENTIAL/PLATELET
Basophils Absolute: 0 10*3/uL (ref 0.0–0.2)
Basos: 1 %
EOS (ABSOLUTE): 0.1 10*3/uL (ref 0.0–0.4)
Eos: 3 %
Hematocrit: 36.4 % (ref 34.0–46.6)
Hemoglobin: 12.5 g/dL (ref 11.1–15.9)
Immature Grans (Abs): 0 10*3/uL (ref 0.0–0.1)
Immature Granulocytes: 0 %
Lymphocytes Absolute: 2.2 10*3/uL (ref 0.7–3.1)
Lymphs: 43 %
MCH: 31.8 pg (ref 26.6–33.0)
MCHC: 34.3 g/dL (ref 31.5–35.7)
MCV: 93 fL (ref 79–97)
Monocytes Absolute: 0.5 10*3/uL (ref 0.1–0.9)
Monocytes: 10 %
Neutrophils Absolute: 2.3 10*3/uL (ref 1.4–7.0)
Neutrophils: 43 %
Platelets: 224 10*3/uL (ref 150–450)
RBC: 3.93 x10E6/uL (ref 3.77–5.28)
RDW: 12.1 % (ref 11.7–15.4)
WBC: 5.2 10*3/uL (ref 3.4–10.8)

## 2019-06-10 NOTE — Telephone Encounter (Signed)
CBC is within normal limits.

## 2019-06-10 NOTE — Telephone Encounter (Signed)
Glucose is mildly elevated, probably not fasting.  Rest the labs are stable.

## 2019-06-14 ENCOUNTER — Ambulatory Visit: Payer: BLUE CROSS/BLUE SHIELD | Admitting: Rheumatology

## 2019-06-24 ENCOUNTER — Telehealth: Payer: Self-pay | Admitting: Family Medicine

## 2019-06-24 NOTE — Telephone Encounter (Signed)
Brand name only.

## 2019-06-24 NOTE — Telephone Encounter (Signed)
Pt called and said her insurance will not pay for the Brand name of the Dilantin unless she has something from her provider stating she can only take the brand name

## 2019-06-25 ENCOUNTER — Telehealth: Payer: Self-pay

## 2019-06-25 NOTE — Telephone Encounter (Signed)
I submitted a PA for the pts. Dilantin and it was approved from 06/25/19-06/24/20.

## 2019-06-28 ENCOUNTER — Other Ambulatory Visit: Payer: Self-pay | Admitting: Family Medicine

## 2019-06-28 DIAGNOSIS — I10 Essential (primary) hypertension: Secondary | ICD-10-CM

## 2019-07-09 NOTE — Progress Notes (Signed)
Office Visit Note  Patient: Kimberly Butler             Date of Birth: [birth year]           MRN: [id number]             PCP: Denita Lung, MD Referring: Denita Lung, MD Visit Date: 07/19/2019 Occupation: @GUAROCC @  Subjective:  Psoriasis   History of Present Illness: Kimberly Butler is a 64 y.o. female with history of psoriatic arthritis and osteoarthritis.  She is on Enbrel 50 mg subcutaneous injections once weekly.  She has previous on methotrexate 0.7 mL once weekly but reduce methotrexate to 0.1 mL once weekly due to noticing increased hair thinning.  She states that she has started having a flare of psoriasis.  She has scattered patches of psoriasis on her upper extremities lower extremities and face.  She tried using desonide topical cream on her face for 1 day but discontinued.  She continues to have chronic right ankle joint pain.  She states she stiffness for about 3 minutes in the morning and has chronic swelling due to venous insufficiency.  She denies any Achilles tendinitis or plantar fasciitis.  She denies any other joint pain or joint swelling.  She denies any SI joint pain at this time.   Activities of Daily Living:  Patient reports morning stiffness for 3 minute.   Patient Denies nocturnal pain.  Difficulty dressing/grooming: Denies Difficulty climbing stairs: Denies Difficulty getting out of chair: Denies Difficulty using hands for taps, buttons, cutlery, and/or writing: Denies  Review of Systems  Constitutional: Positive for fatigue.  HENT: Positive for mouth dryness. Negative for mouth sores and nose dryness.   Eyes: Negative for pain, visual disturbance and dryness.  Respiratory: Negative for cough, hemoptysis, shortness of breath and difficulty breathing.   Cardiovascular: Negative for chest pain, palpitations and hypertension.  Gastrointestinal: Negative for blood in stool, constipation and diarrhea.  Endocrine: Negative for increased urination.    Genitourinary: Negative for difficulty urinating and painful urination.  Musculoskeletal: Positive for joint swelling and morning stiffness. Negative for arthralgias, joint pain, myalgias, muscle weakness, muscle tenderness and myalgias.  Skin: Positive for rash and redness. Negative for color change, pallor, hair loss, nodules/bumps, skin tightness, ulcers and sensitivity to sunlight.  Allergic/Immunologic: Negative for susceptible to infections.  Neurological: Negative for dizziness, numbness, headaches and weakness.  Hematological: Negative for bruising/bleeding tendency and swollen glands.  Psychiatric/Behavioral: Negative for depressed mood and sleep disturbance. The patient is not nervous/anxious.     PMFS History:  Patient Active Problem List   Diagnosis Date Noted  . Lactose intolerance in adult 11/18/2016  . Post-menopausal 08/28/2016  . High risk medication use 08/28/2016  . History of vitamin D deficiency 08/28/2016  . History of gastroesophageal reflux (GERD) 08/28/2016  . History of iron deficiency anemia 08/28/2016  . Psoriasis 07/07/2014  . Seizure disorder ([place]) 01/07/2011  . Hypertension 01/07/2011  . Psoriatic arthritis ([place]) 01/07/2011  . Allergic rhinitis due to pollen 01/07/2011  . Obesity (BMI 30-39.9) 01/07/2011    Past Medical History:  Diagnosis Date  . Allergy    RHINITIS  . 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 Epilepsy ([place])    hasn't had seizure in 20 years (01/15/17)  . GERD (gastroesophageal reflux disease)   . Hypertension   . Obesity   . Psoriasis   . Psoriatic arthritis ([place])   . Smoker   . Varicose veins  Family History  Problem Relation Age of Onset  . Cancer Mother   . Heart disease Mother   . Arthritis Father   . Kidney failure Father   . Dementia Father   . Diabetes Father   . Cancer Sister   . Cancer Brother   . Healthy Son    Past Surgical History:  Procedure Laterality Date  . CATARACT EXTRACTION W/ INTRAOCULAR LENS  IMPLANT Bilateral   . CESAREAN SECTION    . CHOLECYSTECTOMY N/A 01/23/2017   Procedure: LAPAROSCOPIC CHOLECYSTECTOMY;  Surgeon: Georganna Skeans, MD;  Location: [facility];  Service: General;  Laterality: N/A;  . TONSILECTOMY, ADENOIDECTOMY, BILATERAL MYRINGOTOMY AND TUBES    . TUBAL LIGATION     Social History   Social History Narrative  . Not on file   Immunization History  Administered Date(s) Administered  . DTaP 02/06/1994, 11/15/2004  . Influenza Inj Mdck Quad Pf 03/10/2017  . Influenza Whole 07/24/2009  . Influenza,inj,Quad PF,6+ Mos 03/29/2016, 03/24/2018, 03/22/2019  . Influenza-Unspecified 03/10/2014, 03/10/2017  . PPD Test 10/20/1992  . Pneumococcal Conjugate-13 12/18/2007  . Pneumococcal Polysaccharide-23 03/31/2014  . Tdap 03/29/2016  . Zoster Recombinat (Shingrix) 12/15/2018, 03/22/2019     Objective: Vital Signs: BP 112/66 (BP Location: Left Arm, Patient Position: Sitting, Cuff Size: Normal)   Pulse 60   Resp 16   Ht 5\' 7"  (1.702 m)   Wt 200 lb 6.4 oz (90.9 kg)   BMI 31.39 kg/m    Physical Exam Vitals and nursing note reviewed.  Constitutional:      Appearance: She is well-developed.  HENT:     Head: Normocephalic and atraumatic.  Eyes:     Conjunctiva/sclera: Conjunctivae normal.  Cardiovascular:     Rate and Rhythm: Normal rate and regular rhythm.     Heart sounds: Normal heart sounds.  Pulmonary:     Effort: Pulmonary effort is normal.     Breath sounds: Normal breath sounds.  Abdominal:     General: Bowel sounds are normal.     Palpations: Abdomen is soft.  Musculoskeletal:     Cervical back: Normal range of motion.  Lymphadenopathy:     Cervical: No cervical adenopathy.  Skin:    General: Skin is warm and dry.     Capillary Refill: Capillary refill takes less than 2 seconds.  Neurological:     Mental Status: She is alert and oriented to person, place, and time.  Psychiatric:        Behavior: Behavior normal.      Musculoskeletal Exam:  C-spine good range of motion.  Thoracic kyphosis noted.  Lumbar spine good range of motion.  No midline spinal tenderness.  No SI joint tenderness noted.  Shoulder joints, elbow joints, wrist joints, MCPs, PIPs and DIPs good range of motion with no synovitis.  She has PIP and DIP synovial thickening consistent with osteoarthritis of both hands.  Hip joints have good range of motion with no discomfort.  No tenderness over trochanteric bursa bilaterally.  Knee joints have good range of motion with no warmth or effusion.  Right ankle joint mild tenderness noted.  She has pedal edema bilaterally.  No tenderness of MTP or PIP joints in her feet.  No achilles tendonitis or plantar fasciitis.   CDAI Exam: CDAI Score: 0.8  Patient Global: 4 mm; Provider Global: 4 mm Swollen: 0 ; Tender: 1  Joint Exam 07/19/2019      Right  Left  Ankle   Tender  Investigation: No additional findings.  Imaging: No results found.  Recent Labs: Lab Results  Component Value Date   WBC 5.2 06/09/2019   HGB 12.5 06/09/2019   PLT 224 06/09/2019   NA 141 06/09/2019   K 4.3 06/09/2019   CL 102 06/09/2019   CO2 26 06/09/2019   GLUCOSE 119 (H) 06/09/2019   BUN 20 06/09/2019   CREATININE 0.70 06/09/2019   BILITOT <0.2 06/09/2019   ALKPHOS 92 06/09/2019   AST 23 06/09/2019   ALT 17 06/09/2019   PROT 7.2 06/09/2019   ALBUMIN 4.2 06/09/2019   CALCIUM 10.1 06/09/2019   GFRAA 107 06/09/2019   QFTBGOLDPLUS Negative 02/22/2019    Speciality Comments: No specialty comments available.  Procedures:  No procedures performed Allergies: No known allergies   Assessment / Plan:     Visit Diagnoses: Psoriatic arthritis ([place]): She has no synovitis or dactylitis on exam.  She has not had any recent psoriatic arthritis flares.  She has no joint pain or inflammation at this time.  She has not had any Achilles tenderness or plantar fasciitis.  She has no SI joint tenderness on exam today.  She is clinically been doing  well on Enbrel 50 mg subcu injections once weekly.  She was previously injecting methotrexate 0.7 mL once weekly but reduced to 0.1 mL once weekly due to experiencing increased hair thinning during the winter months.  We discussed that it is unlikely that the hair thinning is related to methotrexate use.  She has been experiencing a flare of psoriasis since reducing the dose of methotrexate.  She has scattered patches of psoriasis on her upper and lower extremities and on her face.  She was encouraged to use desonide topically 1-2 times daily for up to 2 weeks on the patches on her face.  She was also advised to increase the dose of methotrexate to 0.3 mL this week and then 0.5 ml next week. We discussed that ideally she will continue on 0.7 mL once weekly due to not having any psoriasis while on the higher dose.  She was advised to notify us if her hair thinning increases while increasing the dose of MTX. A refill of MTX will be sent to the pharmacy.  She will continue on Enbrel 50 mg subcutaneous injections once weekly.  She was advised to notify us if she develops increased joint pain or joint swelling.  She will follow-up in the office in 5 months.  Psoriasis: She has scattered patches of psoriasis on her upper lower extremities as well as on her face.  She reduced her dose of MTX from 0.7 ml to 0.1 ml sq once weekly due noticing more hair thinning during the winter months. She tried using desonide topically for 1 day but did not notice any improvement.  She was advised to use desonide 1-2 times daily for up to 2 weeks on the patches on her face.  She declined a prescription for another topical agent to use on her extremities.  She is going to resume methotrexate 0.3 mL once weekly this week then increase to 0.5 next week.  Ideally she will resume methotrexate 0.7 mg once weekly due to not having any psoriasis while on this dose.  High risk medication use - Enbrel SureClick 50 mg every 7 days, methotrexate  0.7 mL's every 7 days, and folic acid 1 mg 2 tablets daily.  CBC and CMP were drawn on 06/09/2019.  Lab results were reviewed with the patient today in the  office.  All questions were addressed.  TB gold was negative on 02/22/2019.  She will return for lab work in March and every 3 months.  Standing orders are in place.  Primary osteoarthritis of both hands: She has PIP and DIP synovial thickening consistent with osteoarthritis of both hands.  She has no tenderness or synovitis on exam.  She has complete fist formation bilaterally.  Joint protection and muscle strengthening were discussed.  Primary osteoarthritis of both knees: She has good range of motion of bilateral knee joints.  No warmth or effusion was noted.  She has no difficulty climbing steps or getting up from a chair.  Vitamin D deficiency: She is taking calcium and vitamin D supplement on a daily basis.  Other medical conditions are listed as follows:  Essential hypertension  History of iron deficiency anemia  History of gastroesophageal reflux (GERD)  History of epilepsy  History of anemia  Orders: No orders of the defined types were placed in this encounter.  Meds ordered this encounter  Medications  . Methotrexate Sodium (METHOTREXATE, PF,) 50 MG/2ML injection    Sig: INJECT 0.7 MLS (17.5 MG TOTAL) INTO THE SKIN ONCE A WEEK **INSURANCE MAX OF 30 DAYS**    Dispense:  4 mL    Refill:  2    Face-to-face time spent with patient was 30 minutes. Greater than 50% of time was spent in counseling and coordination of care.  Follow-Up Instructions: Return in about 5 months (around 12/16/2019) for Psoriatic arthritis, Osteoarthritis.   Ofilia Neas, PA-C   I examined and evaluated the patient with Hazel Sams PA.  Patient has synovial thickening but no synovitis on examination today.  She is having a flare of psoriasis with some scattered patches.  She will increase her methotrexate dose.  We will see response to the increased  dose.  The plan of care was discussed as noted above.  Bo Merino, MD  Note - This record has been created using Editor, commissioning.  Chart creation errors have been sought, but may not always  have been located. Such creation errors do not reflect on  the standard of medical care.

## 2019-07-19 ENCOUNTER — Other Ambulatory Visit: Payer: Self-pay

## 2019-07-19 ENCOUNTER — Ambulatory Visit (INDEPENDENT_AMBULATORY_CARE_PROVIDER_SITE_OTHER): Payer: BLUE CROSS/BLUE SHIELD | Admitting: Rheumatology

## 2019-07-19 ENCOUNTER — Telehealth: Payer: Self-pay

## 2019-07-19 ENCOUNTER — Encounter: Payer: Self-pay | Admitting: Rheumatology

## 2019-07-19 VITALS — BP 112/66 | HR 60 | Resp 16 | Ht 67.0 in | Wt 200.4 lb

## 2019-07-19 DIAGNOSIS — Z79899 Other long term (current) drug therapy: Secondary | ICD-10-CM

## 2019-07-19 DIAGNOSIS — M19041 Primary osteoarthritis, right hand: Secondary | ICD-10-CM

## 2019-07-19 DIAGNOSIS — Z8719 Personal history of other diseases of the digestive system: Secondary | ICD-10-CM

## 2019-07-19 DIAGNOSIS — Z8669 Personal history of other diseases of the nervous system and sense organs: Secondary | ICD-10-CM

## 2019-07-19 DIAGNOSIS — G40909 Epilepsy, unspecified, not intractable, without status epilepticus: Secondary | ICD-10-CM

## 2019-07-19 DIAGNOSIS — L4050 Arthropathic psoriasis, unspecified: Secondary | ICD-10-CM

## 2019-07-19 DIAGNOSIS — E559 Vitamin D deficiency, unspecified: Secondary | ICD-10-CM

## 2019-07-19 DIAGNOSIS — L409 Psoriasis, unspecified: Secondary | ICD-10-CM

## 2019-07-19 DIAGNOSIS — M19042 Primary osteoarthritis, left hand: Secondary | ICD-10-CM

## 2019-07-19 DIAGNOSIS — M170 Bilateral primary osteoarthritis of knee: Secondary | ICD-10-CM

## 2019-07-19 DIAGNOSIS — I10 Essential (primary) hypertension: Secondary | ICD-10-CM

## 2019-07-19 DIAGNOSIS — Z862 Personal history of diseases of the blood and blood-forming organs and certain disorders involving the immune mechanism: Secondary | ICD-10-CM

## 2019-07-19 MED ORDER — DILANTIN 100 MG PO CAPS: 200.0000 mg | ORAL_CAPSULE | Freq: Two times a day (BID) | ORAL | 3 refills | Status: DC

## 2019-07-19 MED ORDER — METHOTREXATE SODIUM CHEMO INJECTION (PF) 50 MG/2ML: INTRAMUSCULAR | 2 refills | Status: DC

## 2019-07-19 NOTE — Patient Instructions (Signed)
Standing Labs We placed an order today for your standing lab work.    Please come back and get your standing labs in March and every 3 months   We have open lab daily Monday through Thursday from 8:30-12:30 PM and 1:30-4:30 PM and Friday from 8:30-12:30 PM and 1:30-4:00 PM at the office of Dr. Bo Merino.   You may experience shorter wait times on Monday and Friday afternoons. The office is located at [address] [place], [place], [place] [address] No appointment is necessary.   Labs are drawn by [facility].  You may receive a bill from [facility] for your lab work.  If you wish to have your labs drawn at another location, please call the office 24 hours in advance to send orders.  If you have any questions regarding directions or hours of operation,  please call [phone].   Just as a reminder please drink plenty of water prior to coming for your lab work. Thanks!

## 2019-07-19 NOTE — Telephone Encounter (Signed)
Pt was advised [facility] 

## 2019-07-19 NOTE — Telephone Encounter (Signed)
Please advise if pt dilantin can be filled due to pt request. [facility]

## 2019-07-19 NOTE — Telephone Encounter (Signed)
Let her know that I called in as dispense as written.

## 2019-07-19 NOTE — Telephone Encounter (Signed)
Pt. Called stating she needs a refill on he rDilantin has to be the actual brand no generic to [facility] home delivery pt. Last apt. Was 03/22/19.

## 2019-07-22 ENCOUNTER — Telehealth: Payer: Self-pay

## 2019-07-22 NOTE — Telephone Encounter (Signed)
Pharmacy is requesting to give pt generic of dilantin. Please advise [facility]

## 2019-07-22 NOTE — Telephone Encounter (Signed)
Pharmacy was advised. kh

## 2019-07-22 NOTE — Telephone Encounter (Signed)
She wants the branded product

## 2019-07-23 ENCOUNTER — Telehealth: Payer: Self-pay | Admitting: Rheumatology

## 2019-07-23 MED ORDER — METHOTREXATE SODIUM CHEMO INJECTION (PF) 50 MG/2ML: INTRAMUSCULAR | 2 refills | Status: DC

## 2019-07-23 NOTE — Telephone Encounter (Signed)
Last Visit: 07/19/2019 Next Visit: 12/15/2019 Labs: 06/09/2019 Glucose is mildly elevated, probably not fasting. Rest the labs are stable.  Okay to refill per Dr. Estanislado Pandy.   I called and canceled prescription at [facility]. [facility] [place]   Advised patient, patient verbalized understanding.

## 2019-07-23 NOTE — Telephone Encounter (Signed)
Patient called stating she is changing pharmacies and requesting [facility] [address] in [place].  Patient states it was sent to [facility] in [place] and she contacted the pharmacy to let them know she has changed pharmacies and will not be picking up the prescription.

## 2019-07-27 ENCOUNTER — Telehealth: Payer: Self-pay

## 2019-07-27 NOTE — Telephone Encounter (Signed)
pharmacy was advised brand name only. Was advised that pt canceled script . [place]

## 2019-08-03 ENCOUNTER — Other Ambulatory Visit: Payer: Self-pay | Admitting: Rheumatology

## 2019-08-03 NOTE — Telephone Encounter (Signed)
Last visit: 07/19/19 Next visit: 12/15/19 Labs: 06/09/19 Glucose is mildly elevated, probably not fasting. Rest the labs are stable. Tb Gold: 02/22/19 Neg   Okay to refill per Dr. Estanislado Pandy

## 2019-09-14 ENCOUNTER — Encounter: Payer: Self-pay | Admitting: Family Medicine

## 2019-09-14 ENCOUNTER — Ambulatory Visit: Payer: BLUE CROSS/BLUE SHIELD | Admitting: Family Medicine

## 2019-09-14 ENCOUNTER — Other Ambulatory Visit: Payer: Self-pay

## 2019-09-14 VITALS — BP 110/68 | HR 61 | Temp 98.9°F | Wt 185.0 lb

## 2019-09-14 DIAGNOSIS — J209 Acute bronchitis, unspecified: Secondary | ICD-10-CM | POA: Diagnosis not present

## 2019-09-14 DIAGNOSIS — Z20822 Contact with and (suspected) exposure to covid-19: Secondary | ICD-10-CM

## 2019-09-14 MED ORDER — AMOXICILLIN-POT CLAVULANATE 875-125 MG PO TABS: 1.0000 | ORAL_TABLET | Freq: Two times a day (BID) | ORAL | 0 refills | Status: DC

## 2019-09-14 NOTE — Progress Notes (Signed)
   Subjective:    Patient ID: Kimberly Butler, female    DOB: May 22, 1956, 64 y.o.   MRN: [id number]  HPI Documentation for virtual audio and video telecommunications through WebEx encounter: The patient was located at home. The provider was located in the office. The patient did consent to this visit and is aware of possible charges through their insurance for this visit. The other persons participating in this telemedicine service were none. Time spent on call was 3 minutes and in review of previous records >15 minutes total. This virtual service is not related to other E/M service within previous 7 days. She states that on March 20 fifth she came down with fever, chills, weakness and then progressed to having a productive cough, decreased appetite, changes in her smell and taste.  No shortness of breath, sore throat or earache.  She does have underlying psoriatic arthritis and is taking Enbrel as well as methotrexate.  She has held her methotrexate dosing  Review of Systems     Objective:   Physical Exam Alert and in no distress.  No tachypnea is noted.       Assessment & Plan:  Encounter by telehealth for suspected COVID-19  Acute bronchitis, unspecified organism - Plan: amoxicillin-clavulanate (AUGMENTIN) 875-125 MG tablet [facility] and let me know the results.  If it is positive, I will work her into the system. I think it is prudent to go ahead and treat her especially with her underlying immune status being on a DMARD.  I explained this to her and she is comfortable with that.

## 2019-09-29 ENCOUNTER — Telehealth: Payer: Self-pay

## 2019-09-29 NOTE — Telephone Encounter (Signed)
Pt. Aware and she said she never picked up the Augmentin because she started feeling better.

## 2019-09-29 NOTE — Telephone Encounter (Signed)
Yes I am but have her hold her methotrexate while on the antibiotic

## 2019-09-29 NOTE — Telephone Encounter (Signed)
Pharmacists called from [facility] stating that the pt. Has a prescription there for Augmentin but it has an interaction with the pts. Methotrexate S[facility] pharmacists wanted to know if you are ok with filling the Augmentin. [phone].

## 2019-10-29 ENCOUNTER — Other Ambulatory Visit: Payer: Self-pay | Admitting: Rheumatology

## 2019-10-29 DIAGNOSIS — Z79899 Other long term (current) drug therapy: Secondary | ICD-10-CM

## 2019-10-29 NOTE — Telephone Encounter (Addendum)
Last Visit: 07/19/2019 Next Visit:12/15/2019 Labs: 06/09/2019 Glucose is mildly elevated, probably not fasting. Rest the labs are stable.   Advised patient she is due to update labs, patient verbalized understanding and will come Monday, 11/01/2019 to update labs prior to refill. Orders are in place.

## 2019-11-02 NOTE — Telephone Encounter (Addendum)
Patient advised we are still in need of labs in order to refill medication. Patient states she will update them this week.

## 2019-11-04 ENCOUNTER — Other Ambulatory Visit: Payer: Self-pay | Admitting: Family Medicine

## 2019-11-04 ENCOUNTER — Telehealth: Payer: Self-pay | Admitting: Rheumatology

## 2019-11-04 DIAGNOSIS — I10 Essential (primary) hypertension: Secondary | ICD-10-CM

## 2019-11-04 DIAGNOSIS — Z79899 Other long term (current) drug therapy: Secondary | ICD-10-CM

## 2019-11-04 NOTE — Telephone Encounter (Signed)
Lab Order released.  

## 2019-11-04 NOTE — Telephone Encounter (Signed)
Patient called requesting her labwork orders be sent to [facility] in [place].  Patient states she will be going this afternoon, 11/04/19.

## 2019-11-05 ENCOUNTER — Telehealth: Payer: Self-pay | Admitting: Rheumatology

## 2019-11-05 NOTE — Telephone Encounter (Signed)
Patient advised we should have her lab results when we are back in the office on 11/09/2019. Patient advised we will be able to send in prescription once received.

## 2019-11-05 NOTE — Telephone Encounter (Signed)
Patient left a voicemail stating she had her labwork today and once the results have been received requesting a refill of [facility] [place].

## 2019-11-06 LAB — CMP14+EGFR
ALT: 16 IU/L (ref 0–32)
AST: 21 IU/L (ref 0–40)
Albumin/Globulin Ratio: 1.4 (ref 1.2–2.2)
Albumin: 4.1 g/dL (ref 3.8–4.8)
Alkaline Phosphatase: 81 IU/L (ref 48–121)
BUN/Creatinine Ratio: 27 (ref 12–28)
BUN: 18 mg/dL (ref 8–27)
Bilirubin Total: <0.2 mg/dL (ref 0.0–1.2)
CO2: 25 mmol/L (ref 20–29)
Calcium: 9.5 mg/dL (ref 8.7–10.3)
Chloride: 102 mmol/L (ref 96–106)
Creatinine, Ser: 0.67 mg/dL (ref 0.57–1.00)
GFR calc Af Amer: 107 mL/min/{1.73_m2} (ref >59)
GFR calc non Af Amer: 93 mL/min/{1.73_m2} (ref >59)
Globulin, Total: 2.9 g/dL (ref 1.5–4.5)
Glucose: 88 mg/dL (ref 65–99)
Potassium: 3.9 mmol/L (ref 3.5–5.2)
Sodium: 141 mmol/L (ref 134–144)
Total Protein: 7 g/dL (ref 6.0–8.5)

## 2019-11-06 LAB — CBC WITH DIFFERENTIAL/PLATELET
Basophils Absolute: 0 10*3/uL (ref 0.0–0.2)
Basos: 1 %
EOS (ABSOLUTE): 0.2 10*3/uL (ref 0.0–0.4)
Eos: 4 %
Hematocrit: 34.3 % (ref 34.0–46.6)
Hemoglobin: 11.8 g/dL (ref 11.1–15.9)
Immature Grans (Abs): 0 10*3/uL (ref 0.0–0.1)
Immature Granulocytes: 0 %
Lymphocytes Absolute: 2.5 10*3/uL (ref 0.7–3.1)
Lymphs: 46 %
MCH: 32.7 pg (ref 26.6–33.0)
MCHC: 34.4 g/dL (ref 31.5–35.7)
MCV: 95 fL (ref 79–97)
Monocytes Absolute: 0.5 10*3/uL (ref 0.1–0.9)
Monocytes: 9 %
Neutrophils Absolute: 2.1 10*3/uL (ref 1.4–7.0)
Neutrophils: 40 %
Platelets: 226 10*3/uL (ref 150–450)
RBC: 3.61 x10E6/uL — ABNORMAL LOW (ref 3.77–5.28)
RDW: 13.6 % (ref 11.7–15.4)
WBC: 5.3 10*3/uL (ref 3.4–10.8)

## 2019-11-07 NOTE — Telephone Encounter (Signed)
CBC is stable.

## 2019-11-09 ENCOUNTER — Telehealth: Payer: Self-pay | Admitting: *Deleted

## 2019-11-09 MED ORDER — METHOTREXATE SODIUM CHEMO INJECTION (PF) 50 MG/2ML: INTRAMUSCULAR | 2 refills | Status: DC

## 2019-11-09 NOTE — Telephone Encounter (Signed)
CMP WNL

## 2019-11-09 NOTE — Telephone Encounter (Signed)
-----   Message from Ofilia Neas, PA-C sent at 11/09/2019  8:04 AM EDT ----- CMP WNL

## 2019-11-09 NOTE — Telephone Encounter (Signed)
Patient requesting refill on MTX  Last Visit: 07/19/2019 Next Visit: 12/21/2019 Labs: 11/05/2019 CBC stable CMP WNL  Current Dose per office note 07/19/2019: methotrexate 0.7 mL's every 7 days  Okay to refill per Dr. Estanislado Pandy

## 2019-12-10 LAB — HM MAMMOGRAPHY

## 2019-12-10 NOTE — Progress Notes (Deleted)
Office Visit Note  Patient: Kimberly Butler             Date of Birth: 1956-03-20           MRN: [id number]             PCP: Denita Lung, MD Referring: Denita Lung, MD Visit Date: 12/21/2019 Occupation: @GUAROCC @  Subjective:  No chief complaint on file.   History of Present Illness: Kimberly Butler is a 64 y.o. female ***   Activities of Daily Living:  Patient reports morning stiffness for *** {minute/hour:19697}.   Patient {ACTIONS;DENIES/REPORTS:21021675::"Denies"} nocturnal pain.  Difficulty dressing/grooming: {ACTIONS;DENIES/REPORTS:21021675::"Denies"} Difficulty climbing stairs: {ACTIONS;DENIES/REPORTS:21021675::"Denies"} Difficulty getting out of chair: {ACTIONS;DENIES/REPORTS:21021675::"Denies"} Difficulty using hands for taps, buttons, cutlery, and/or writing: {ACTIONS;DENIES/REPORTS:21021675::"Denies"}  No Rheumatology ROS completed.   PMFS History:  Patient Active Problem List   Diagnosis Date Noted  . Lactose intolerance in adult 11/18/2016  . Post-menopausal 08/28/2016  . High risk medication use 08/28/2016  . History of vitamin D deficiency 08/28/2016  . History of gastroesophageal reflux (GERD) 08/28/2016  . History of iron deficiency anemia 08/28/2016  . Psoriasis 07/07/2014  . Seizure disorder ([place]) 01/07/2011  . Hypertension 01/07/2011  . Psoriatic arthritis ([place]) 01/07/2011  . Allergic rhinitis due to pollen 01/07/2011  . Obesity (BMI 30-39.9) 01/07/2011    Past Medical History:  Diagnosis Date  . Allergy    RHINITIS  . 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 Epilepsy ([place])    hasn't had seizure in 20 years (01/15/17)  . GERD (gastroesophageal reflux disease)   . Hypertension   . Obesity   . Psoriasis   . Psoriatic arthritis ([place])   . Smoker   . Varicose veins     Family History  Problem Relation Age of Onset  . Cancer Mother   . Heart disease Mother   . Arthritis Father   . Kidney failure Father   . Dementia Father   . Diabetes  Father   . Cancer Sister   . Cancer Brother   . Healthy Son    Past Surgical History:  Procedure Laterality Date  . CATARACT EXTRACTION W/ INTRAOCULAR LENS IMPLANT Bilateral   . CESAREAN SECTION    . CHOLECYSTECTOMY N/A 01/23/2017   Procedure: LAPAROSCOPIC CHOLECYSTECTOMY;  Surgeon: Georganna Skeans, MD;  Location: [facility];  Service: General;  Laterality: N/A;  . TONSILECTOMY, ADENOIDECTOMY, BILATERAL MYRINGOTOMY AND TUBES    . TUBAL LIGATION     Social History   Social History Narrative  . Not on file   Immunization History  Administered Date(s) Administered  . DTaP 02/06/1994, 11/15/2004  . Influenza Inj Mdck Quad Pf 03/10/2017  . Influenza Whole 07/24/2009  . Influenza,inj,Quad PF,6+ Mos 03/29/2016, 03/24/2018, 03/22/2019  . Influenza-Unspecified 03/10/2014, 03/10/2017  . PPD Test 10/20/1992  . Pneumococcal Conjugate-13 12/18/2007  . Pneumococcal Polysaccharide-23 03/31/2014  . Tdap 03/29/2016  . Zoster Recombinat (Shingrix) 12/15/2018, 03/22/2019     Objective: Vital Signs: There were no vitals taken for this visit.   Physical Exam   Musculoskeletal Exam: ***  CDAI Exam: CDAI Score: -- Patient Global: --; Provider Global: -- Swollen: --; Tender: -- Joint Exam 12/21/2019   No joint exam has been documented for this visit   There is currently no information documented on the homunculus. Go to the Rheumatology activity and complete the homunculus joint exam.  Investigation: No additional findings.  Imaging: No results found.  Recent Labs: Lab Results  Component Value Date  WBC 5.3 11/05/2019   HGB 11.8 11/05/2019   PLT 226 11/05/2019   NA 141 11/05/2019   K 3.9 11/05/2019   CL 102 11/05/2019   CO2 25 11/05/2019   GLUCOSE 88 11/05/2019   BUN 18 11/05/2019   CREATININE 0.67 11/05/2019   BILITOT <0.2 11/05/2019   ALKPHOS 81 11/05/2019   AST 21 11/05/2019   ALT 16 11/05/2019   PROT 7.0 11/05/2019   ALBUMIN 4.1 11/05/2019   CALCIUM 9.5 11/05/2019    GFRAA 107 11/05/2019   QFTBGOLDPLUS Negative 02/22/2019    Speciality Comments: No specialty comments available.  Procedures:  No procedures performed Allergies: No known allergies   Assessment / Plan:     Visit Diagnoses: No diagnosis found.  Orders: No orders of the defined types were placed in this encounter.  No orders of the defined types were placed in this encounter.   Face-to-face time spent with patient was *** minutes. Greater than 50% of time was spent in counseling and coordination of care.  Follow-Up Instructions: No follow-ups on file.   Ofilia Neas, PA-C  Note - This record has been created using Dragon software.  Chart creation errors have been sought, but may not always  have been located. Such creation errors do not reflect on  the standard of medical care.

## 2019-12-15 ENCOUNTER — Ambulatory Visit: Payer: BLUE CROSS/BLUE SHIELD | Admitting: Rheumatology

## 2019-12-21 ENCOUNTER — Encounter: Payer: Self-pay | Admitting: Family Medicine

## 2019-12-21 ENCOUNTER — Ambulatory Visit: Payer: BLUE CROSS/BLUE SHIELD | Admitting: Physician Assistant

## 2020-01-11 ENCOUNTER — Other Ambulatory Visit: Payer: Self-pay | Admitting: Rheumatology

## 2020-01-17 NOTE — Progress Notes (Signed)
Office Visit Note  Patient: Kimberly Butler             Date of Birth: 21-Nov-1955           MRN: [id number]             PCP: Denita Lung, MD Referring: Denita Lung, MD Visit Date: 01/31/2020 Occupation: @GUAROCC @  Subjective:  Hair loss   History of Present Illness: Kimberly Butler is a 64 y.o. female with history of psoriatic arthritis and osteoarthritis.  Patient is on Enbrel 50 mg subcutaneous injections once weekly, methotrexate 0.4 mL subcu injections once weekly, folic acid 2 mg by mouth daily.  She continues to have persistent hair loss despite being on low-dose methotrexate.  She would like to discuss discontinuing or spacing the dose of methotrexate.  She has been taking Biotene but has not noticed much improvement.  She denies any recent psoriatic arthritis flares.  She denies any joint pain, joint swelling, joint stiffness at this time.  She experiences occasional small patches of psoriasis and uses a topical agent which is helpful.  She denies any Achilles tendinitis or plantar fasciitis.  She denies any SI joint pain. She states that she had the COVID-19 infection several months ago and her symptoms have completely resolved.  She is apprehensive to receive the Covid 19 vaccination.  Activities of Daily Living:  Patient reports morning stiffness for 0  minutes.   Patient Denies nocturnal pain.  Difficulty dressing/grooming: Denies Difficulty climbing stairs: Denies Difficulty getting out of chair: Denies Difficulty using hands for taps, buttons, cutlery, and/or writing: Denies  Review of Systems  Constitutional: Negative for fatigue.  HENT: Negative for mouth sores, mouth dryness and nose dryness.   Eyes: Negative for itching and dryness.  Respiratory: Negative for shortness of breath and difficulty breathing.   Cardiovascular: Positive for swelling in legs/feet. Negative for chest pain and palpitations.  Gastrointestinal: Negative for blood in stool, constipation and  diarrhea.  Endocrine: Negative for increased urination.  Genitourinary: Negative for difficulty urinating.  Musculoskeletal: Negative for arthralgias, joint pain, joint swelling, myalgias, morning stiffness, muscle tenderness and myalgias.  Skin: Positive for rash. Negative for color change.  Allergic/Immunologic: Negative for susceptible to infections.  Neurological: Negative for dizziness, numbness, headaches, memory loss and weakness.  Hematological: Negative for bruising/bleeding tendency.  Psychiatric/Behavioral: Negative for confusion.    PMFS History:  Patient Active Problem List   Diagnosis Date Noted  . Lactose intolerance in adult 11/18/2016  . Post-menopausal 08/28/2016  . High risk medication use 08/28/2016  . History of vitamin D deficiency 08/28/2016  . History of gastroesophageal reflux (GERD) 08/28/2016  . History of iron deficiency anemia 08/28/2016  . Psoriasis 07/07/2014  . Seizure disorder ([place]) 01/07/2011  . Hypertension 01/07/2011  . Psoriatic arthritis ([place]) 01/07/2011  . Allergic rhinitis due to pollen 01/07/2011  . Obesity (BMI 30-39.9) 01/07/2011    Past Medical History:  Diagnosis Date  . Allergy    RHINITIS  . 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 Epilepsy ([place])    hasn't had seizure in 20 years (01/15/17)  . GERD (gastroesophageal reflux disease)   . Hypertension   . Obesity   . Psoriasis   . Psoriatic arthritis ([place])   . Smoker   . Varicose veins     Family History  Problem Relation Age of Onset  . Cancer Mother   . Heart disease Mother   . Arthritis Father   .  Kidney failure Father   . Dementia Father   . Diabetes Father   . Cancer Sister   . Cancer Brother   . Healthy Son    Past Surgical History:  Procedure Laterality Date  . CATARACT EXTRACTION W/ INTRAOCULAR LENS IMPLANT Bilateral   . CESAREAN SECTION    . CHOLECYSTECTOMY N/A 01/23/2017   Procedure: LAPAROSCOPIC CHOLECYSTECTOMY;  Surgeon: Georganna Skeans, MD;  Location: [facility];  Service: General;  Laterality: N/A;  . TONSILECTOMY, ADENOIDECTOMY, BILATERAL MYRINGOTOMY AND TUBES    . TUBAL LIGATION     Social History   Social History Narrative  . Not on file   Immunization History  Administered Date(s) Administered  . DTaP 02/06/1994, 11/15/2004  . Influenza Inj Mdck Quad Pf 03/10/2017  . Influenza Whole 07/24/2009  . Influenza,inj,Quad PF,6+ Mos 03/29/2016, 03/24/2018, 03/22/2019  . Influenza-Unspecified 03/10/2014, 03/10/2017  . PPD Test 10/20/1992  . Pneumococcal Conjugate-13 12/18/2007  . Pneumococcal Polysaccharide-23 03/31/2014  . Tdap 03/29/2016  . Zoster Recombinat (Shingrix) 12/15/2018, 03/22/2019     Objective: Vital Signs: BP 124/81 (BP Location: Left Arm, Patient Position: Sitting, Cuff Size: Normal)   Pulse (!) 59   Resp 15   Ht 5\' 7"  (1.702 m)   Wt 186 lb 9.6 oz (84.6 kg)   BMI 29.23 kg/m    Physical Exam Vitals and nursing note reviewed.  Constitutional:      Appearance: She is well-developed.  HENT:     Head: Normocephalic and atraumatic.  Eyes:     Conjunctiva/sclera: Conjunctivae normal.  Pulmonary:     Effort: Pulmonary effort is normal.  Abdominal:     Palpations: Abdomen is soft.  Musculoskeletal:     Cervical back: Normal range of motion.  Lymphadenopathy:     Cervical: No cervical adenopathy.  Skin:    General: Skin is warm and dry.     Capillary Refill: Capillary refill takes less than 2 seconds.  Neurological:     Mental Status: She is alert and oriented to person, place, and time.  Psychiatric:        Behavior: Behavior normal.      Musculoskeletal Exam: C-spine, thoracic spine, and lumbar spine good ROM.  No midline spinal tenderness.  No SI joint tenderness.  Shoulder joints, elbow joints, wrist joints, MCPs, PIPs, and DIPs have good range of motion with no synovitis.  Synovial thickening of the right second MCP joint noted.  PIP and DIP thickening consistent with osteoarthritis of both hands.  Complete  fist formation bilaterally.  Hip joints have good range of motion with no discomfort.  Knee joints have good range of motion with no warmth or effusion.  Ankle joints have good range of motion with no tenderness or inflammation.  No Achilles tendinitis or plantar fasciitis.  CDAI Exam: CDAI Score: -- Patient Global: --; Provider Global: -- Swollen: --; Tender: -- Joint Exam 01/31/2020   No joint exam has been documented for this visit   There is currently no information documented on the homunculus. Go to the Rheumatology activity and complete the homunculus joint exam.  Investigation: No additional findings.  Imaging: No results found.  Recent Labs: Lab Results  Component Value Date   WBC 5.3 11/05/2019   HGB 11.8 11/05/2019   PLT 226 11/05/2019   NA 141 11/05/2019   K 3.9 11/05/2019   CL 102 11/05/2019   CO2 25 11/05/2019   GLUCOSE 88 11/05/2019   BUN 18 11/05/2019   CREATININE 0.67 11/05/2019   BILITOT <  0.2 11/05/2019   ALKPHOS 81 11/05/2019   AST 21 11/05/2019   ALT 16 11/05/2019   PROT 7.0 11/05/2019   ALBUMIN 4.1 11/05/2019   CALCIUM 9.5 11/05/2019   GFRAA 107 11/05/2019   QFTBGOLDPLUS Negative 02/22/2019    Speciality Comments: No specialty comments available.  Procedures:  No procedures performed Allergies: No known allergies   Assessment / Plan:     Visit Diagnoses: Psoriatic arthritis ([place]): She has no synovitis or dactylitis on exam.  She has not had any recent psoriatic arthritis flares.  She is clinically doing well on Enbrel 50 mg subcutaneous injections once weekly, methotrexate 0.4 mL subcutaneous injections once weekly, and folic acid 2 mg by mouth daily.  She has not experienced any joint pain, inflammation, or morning stiffness at this time.  She has not had any Achilles tendinitis, plantar fasciitis, or SI joint pain.  She experiences occasional scattered patches of psoriasis which resolved with topical agents.  She has noticed persistent hair loss  despite being on low-dose methotrexate.  She requested to discontinue methotrexate but we suggested spacing the dose to every 10 days and to start taking nutrafol to see if she has an improvement.  She was advised to notify us if she develops increased joint pain or inflammation after spacing the dose of methotrexate.  She will follow-up in the office in 5 months.  Psoriasis: She experiences scattered patches of psoriasis.  She uses a topical agent which is helpful.  She was encouraged to continue on Enbrel 50 mg sq injections once weekly.  High risk medication use - Enbrel SureClick 50 mg every 7 days, methotrexate 0.4 ml sq injections every 10 days, and folic acid 1 mg 2 tablets daily.  CBC and CMP were drawn on 11/05/2019.  She is due to update lab work today.  Orders for CBC and CMP were released.  Standing orders for CBC and CMP are in place.  TB gold was negative on 02/22/2019.  Future order for TB gold was placed today.- Plan: CBC with Differential/Platelet, COMPLETE METABOLIC PANEL WITH GFR, QuantiFERON-TB Gold Plus  We discussed the importance of holding Enbrel methotrexate if she develops signs or symptoms of an infection and to resume once the infection has completely cleared.  She had the COVID-19 infection several months ago and her symptoms have completely resolved.  She is apprehensive to receive the Covid 19 vaccination at this time.  We discussed that ACR does recommend receiving the vaccine booster in patients that are immunocompromise.  She was advised to hold methotrexate 1 week after each vaccination if she decides to proceed.  We discussed that if she develops a COVID-19 infection she is to notify us or her PCP in order to receive the antibody infusion.  She voiced understanding.  We also discussed the importance of yearly skin exams while on Enbrel.   Screening for tuberculosis - TB gold negative on 02/22/2019.  Future order for TB gold was placed today.  Plan: QuantiFERON-TB Gold  Plus  Primary osteoarthritis of both hands: She has PIP and DIP thickening consistent with osteoarthritis of both hands.  No tenderness or inflammation was noted.  She has complete fist formation bilaterally.  She does not experience any discomfort or morning stiffness in her hands.  Joint protection and muscle strengthening were discussed.  Primary osteoarthritis of both knees: She has good range of motion of both knee joints on exam.  No warmth or effusion was noted.  She has no discomfort or stiffness  in her knees at this time.  Vitamin D deficiency: She is taking vitamin D 5000 units by mouth daily.  Other medical conditions are listed as follows:  Essential hypertension  History of gastroesophageal reflux (GERD)  History of epilepsy  History of anemia  History of iron deficiency anemia   Orders: Orders Placed This Encounter  Procedures  . CBC with Differential/Platelet  . COMPLETE METABOLIC PANEL WITH GFR  . QuantiFERON-TB Gold Plus   No orders of the defined types were placed in this encounter.     Follow-Up Instructions: Return in about 5 months (around 07/02/2020) for Psoriatic arthritis.   Ofilia Neas, PA-C  Note - This record has been created using Dragon software.  Chart creation errors have been sought, but may not always  have been located. Such creation errors do not reflect on  the standard of medical care.

## 2020-01-21 ENCOUNTER — Other Ambulatory Visit: Payer: Self-pay | Admitting: *Deleted

## 2020-01-21 MED ORDER — ENBREL SURECLICK 50 MG/ML ~~LOC~~ SOAJ: 50.0000 mg | SUBCUTANEOUS | 0 refills | Status: DC

## 2020-01-21 NOTE — Telephone Encounter (Signed)
Last Visit: 07/19/2019 Next Visit: 01/31/2020 Labs: 11/05/2019 CMP WNL CBC is stable. TB Gold: 02/22/2019 Neg   Current Dose per office note on 06/14/9967: Enbrel SureClick 50 mg every 7 days  Okay to refill per Dr. Estanislado Pandy

## 2020-01-22 ENCOUNTER — Other Ambulatory Visit: Payer: Self-pay | Admitting: Family Medicine

## 2020-01-22 DIAGNOSIS — G40909 Epilepsy, unspecified, not intractable, without status epilepticus: Secondary | ICD-10-CM

## 2020-01-24 NOTE — Telephone Encounter (Signed)
[facility] is requesting to fill pt dilantin. Please advise [facility]

## 2020-01-31 ENCOUNTER — Encounter: Payer: Self-pay | Admitting: Physician Assistant

## 2020-01-31 ENCOUNTER — Other Ambulatory Visit: Payer: Self-pay

## 2020-01-31 ENCOUNTER — Ambulatory Visit (INDEPENDENT_AMBULATORY_CARE_PROVIDER_SITE_OTHER): Payer: BLUE CROSS/BLUE SHIELD | Admitting: Physician Assistant

## 2020-01-31 VITALS — BP 124/81 | HR 59 | Resp 15 | Ht 67.0 in | Wt 186.6 lb

## 2020-01-31 DIAGNOSIS — Z79899 Other long term (current) drug therapy: Secondary | ICD-10-CM

## 2020-01-31 DIAGNOSIS — L409 Psoriasis, unspecified: Secondary | ICD-10-CM | POA: Diagnosis not present

## 2020-01-31 DIAGNOSIS — L4050 Arthropathic psoriasis, unspecified: Secondary | ICD-10-CM

## 2020-01-31 DIAGNOSIS — Z862 Personal history of diseases of the blood and blood-forming organs and certain disorders involving the immune mechanism: Secondary | ICD-10-CM

## 2020-01-31 DIAGNOSIS — M19041 Primary osteoarthritis, right hand: Secondary | ICD-10-CM | POA: Diagnosis not present

## 2020-01-31 DIAGNOSIS — Z8669 Personal history of other diseases of the nervous system and sense organs: Secondary | ICD-10-CM

## 2020-01-31 DIAGNOSIS — M170 Bilateral primary osteoarthritis of knee: Secondary | ICD-10-CM

## 2020-01-31 DIAGNOSIS — Z8719 Personal history of other diseases of the digestive system: Secondary | ICD-10-CM

## 2020-01-31 DIAGNOSIS — Z111 Encounter for screening for respiratory tuberculosis: Secondary | ICD-10-CM

## 2020-01-31 DIAGNOSIS — I10 Essential (primary) hypertension: Secondary | ICD-10-CM

## 2020-01-31 DIAGNOSIS — M19042 Primary osteoarthritis, left hand: Secondary | ICD-10-CM

## 2020-01-31 DIAGNOSIS — E559 Vitamin D deficiency, unspecified: Secondary | ICD-10-CM

## 2020-01-31 LAB — COMPLETE METABOLIC PANEL WITH GFR
AG Ratio: 1.4 (calc) (ref 1.0–2.5)
ALT: 16 U/L (ref 6–29)
AST: 23 U/L (ref 10–35)
Albumin: 4.1 g/dL (ref 3.6–5.1)
Alkaline phosphatase (APISO): 77 U/L (ref 37–153)
BUN: 18 mg/dL (ref 7–25)
CO2: 31 mmol/L (ref 20–32)
Calcium: 9.7 mg/dL (ref 8.6–10.4)
Chloride: 103 mmol/L (ref 98–110)
Creat: 0.67 mg/dL (ref 0.50–0.99)
GFR, Est African American: 108 mL/min/{1.73_m2} (ref >=60)
GFR, Est Non African American: 93 mL/min/{1.73_m2} (ref >=60)
Globulin: 3 g/dL (calc) (ref 1.9–3.7)
Glucose, Bld: 90 mg/dL (ref 65–99)
Potassium: 4.1 mmol/L (ref 3.5–5.3)
Sodium: 141 mmol/L (ref 135–146)
Total Bilirubin: 0.4 mg/dL (ref 0.2–1.2)
Total Protein: 7.1 g/dL (ref 6.1–8.1)

## 2020-01-31 LAB — CBC WITH DIFFERENTIAL/PLATELET
Absolute Monocytes: 491 cells/uL (ref 200–950)
Basophils Absolute: 22 cells/uL (ref 0–200)
Basophils Relative: 0.4 %
Eosinophils Absolute: 130 cells/uL (ref 15–500)
Eosinophils Relative: 2.4 %
HCT: 36.6 % (ref 35.0–45.0)
Hemoglobin: 12.2 g/dL (ref 11.7–15.5)
Lymphs Abs: 1987 cells/uL (ref 850–3900)
MCH: 31.5 pg (ref 27.0–33.0)
MCHC: 33.3 g/dL (ref 32.0–36.0)
MCV: 94.6 fL (ref 80.0–100.0)
MPV: 10.6 fL (ref 7.5–12.5)
Monocytes Relative: 9.1 %
Neutro Abs: 2770 cells/uL (ref 1500–7800)
Neutrophils Relative %: 51.3 %
Platelets: 217 10*3/uL (ref 140–400)
RBC: 3.87 10*6/uL (ref 3.80–5.10)
RDW: 12.1 % (ref 11.0–15.0)
Total Lymphocyte: 36.8 %
WBC: 5.4 10*3/uL (ref 3.8–10.8)

## 2020-01-31 NOTE — Patient Instructions (Addendum)
Nutrafol for hair loss     Standing Labs We placed an order today for your standing lab work.   Please have your standing labs drawn in November and every 3 months   TB gold due afte sept 20th   If possible, please have your labs drawn 2 weeks prior to your appointment so that the provider can discuss your results at your appointment.  We have open lab daily Monday through Thursday from 8:30-12:30 PM and 1:30-4:30 PM and Friday from 8:30-12:30 PM and 1:30-4:00 PM at the office of Dr. Bo Merino, [facility].   Please be advised, patients with office appointments requiring lab work will take precedents over walk-in lab work.  If possible, please come for your lab work on Monday and Friday afternoons, as you may experience shorter wait times. The office is located at [address], [place], [place], [place] [address] No appointment is necessary.   Labs are drawn by [facility]. Please bring your co-pay at the time of your lab draw.  You may receive a bill from [facility] for your lab work.  If you wish to have your labs drawn at another location, please call the office 24 hours in advance to send orders.  If you have any questions regarding directions or hours of operation,  please call [phone].   As a reminder, please drink plenty of water prior to coming for your lab work. Thanks!   COVID-19 vaccine recommendations:   COVID-19 vaccine is recommended for everyone (unless you are allergic to a vaccine component), even if you are on a medication that suppresses your immune system.   If you are on Methotrexate, Cellcept (mycophenolate), Rinvoq, Morrie Sheldon, and Olumiant- hold the medication for 1 week after each vaccine. Hold Methotrexate for 2 weeks after the single dose COVID-19 vaccine.   If you are on Orencia subcutaneous injection - hold medication one week prior to and one week after the first COVID-19 vaccine dose (only).   If you are on Orencia IV infusions- time  vaccination administration so that the first COVID-19 vaccination will occur four weeks after the infusion and postpone the subsequent infusion by one week.   If you are on Cyclophosphamide or Rituxan infusions please contact your doctor prior to receiving the COVID-19 vaccine.   Do not take Tylenol or any anti-inflammatory medications (NSAIDs) 24 hours prior to the COVID-19 vaccination.   There is no direct evidence about the efficacy of the COVID-19 vaccine in individuals who are on medications that suppress the immune system.   Even if you are fully vaccinated, and you are on any medications that suppress your immune system, please continue to wear a mask, maintain at least six feet social distance and practice hand hygiene.   If you develop a COVID-19 infection, please contact your PCP or our office to determine if you need antibody infusion.  The booster vaccine is now available for immunocompromised patients. It is advised that if you had Pfizer vaccine you should get Coca-[employer]a booster.  If you had a Moderna vaccine then you should get a Moderna booster. Johnson and Wynetta Emery does not have a booster vaccine at this time.  Please see the following web sites for updated information.   https://www.rheumatology.org/Portals/0/Files/COVID-19-Vaccination-Patient-Resources.pdf  https://www.rheumatology.org/About-Us/Newsroom/Press-Releases/ID/1159

## 2020-02-01 NOTE — Progress Notes (Signed)
CBC and CMP WNL

## 2020-02-23 ENCOUNTER — Other Ambulatory Visit: Payer: Self-pay | Admitting: Radiology

## 2020-02-23 MED ORDER — ENBREL SURECLICK 50 MG/ML ~~LOC~~ SOAJ: 50.0000 mg | SUBCUTANEOUS | 0 refills | Status: DC

## 2020-02-23 NOTE — Telephone Encounter (Signed)
Last Visit: 01/31/20 Next Visit: 07/03/2020 Labs: CBC and CMP WNL 01/31/20 TB Gold: Negative, 02/22/2019  Current Dose per office note Enbrel SureClick 50 mg every 7 days  BR:AXENMMHWK arthritis  Is it okay to fill 30-day supply? Patient advised updated TB gold is needed. Patient plans to come in Monday 02/28/2020.

## 2020-03-01 ENCOUNTER — Other Ambulatory Visit: Payer: Self-pay

## 2020-03-01 DIAGNOSIS — Z111 Encounter for screening for respiratory tuberculosis: Secondary | ICD-10-CM

## 2020-03-01 DIAGNOSIS — Z79899 Other long term (current) drug therapy: Secondary | ICD-10-CM

## 2020-03-03 LAB — QUANTIFERON-TB GOLD PLUS
Mitogen-NIL: >10 IU/mL
NIL: 0.12 IU/mL
QuantiFERON-TB Gold Plus: NEGATIVE
TB1-NIL: 0.01 IU/mL
TB2-NIL: 0.02 IU/mL

## 2020-03-06 NOTE — Progress Notes (Signed)
TB gold negative

## 2020-03-24 ENCOUNTER — Other Ambulatory Visit: Payer: Self-pay | Admitting: *Deleted

## 2020-03-28 ENCOUNTER — Other Ambulatory Visit: Payer: Self-pay | Admitting: Physician Assistant

## 2020-03-28 NOTE — Telephone Encounter (Signed)
Last Visit: 01/31/20 Next Visit: 07/03/2020 Labs: CBC and CMP WNL 01/31/20 TB Gold: Negative, 03/01/2020   Current Dose per office note Enbrel SureClick 50 mg every 7 days  BW:[id number] arthritis  Okay to refill per Dr. Estanislado Pandy

## 2020-03-31 ENCOUNTER — Telehealth: Payer: Self-pay

## 2020-03-31 ENCOUNTER — Other Ambulatory Visit: Payer: Self-pay

## 2020-03-31 DIAGNOSIS — I10 Essential (primary) hypertension: Secondary | ICD-10-CM

## 2020-03-31 MED ORDER — HYDROCHLOROTHIAZIDE 12.5 MG PO CAPS: 12.5000 mg | ORAL_CAPSULE | Freq: Every day | ORAL | 0 refills | Status: DC

## 2020-03-31 MED ORDER — LOSARTAN POTASSIUM 50 MG PO TABS: 50.0000 mg | ORAL_TABLET | Freq: Every day | ORAL | 0 refills | Status: DC

## 2020-03-31 NOTE — Telephone Encounter (Signed)
done

## 2020-03-31 NOTE — Telephone Encounter (Signed)
Pt needs refills HCTZ & Losartan to [employer] order for 90 days.  I scheduled her a CPE

## 2020-03-31 NOTE — Telephone Encounter (Signed)
Done KH 

## 2020-04-19 ENCOUNTER — Other Ambulatory Visit: Payer: Self-pay | Admitting: Rheumatology

## 2020-04-19 NOTE — Telephone Encounter (Signed)
Last Visit:01/31/20 Next Visit:07/03/2020  Okay to refill per Dr. Estanislado Pandy

## 2020-05-16 ENCOUNTER — Other Ambulatory Visit: Payer: Self-pay | Admitting: Rheumatology

## 2020-05-16 NOTE — Telephone Encounter (Signed)
Last Visit:01/31/20 Next Visit:07/03/2020 Labs: 01/31/20 CBC and CMP WNL TB Gold:Negative, 03/01/2020   Current Dose per office noteEnbrel SureClick 50 mg every 7 days  EQ:UHKISNGXE arthritis  Patient advised she is due to update labs. Patient states she will update labs on Monday.  Okay to refill 30 day supply Enbrel?

## 2020-05-22 ENCOUNTER — Other Ambulatory Visit: Payer: Self-pay | Admitting: *Deleted

## 2020-05-22 DIAGNOSIS — Z79899 Other long term (current) drug therapy: Secondary | ICD-10-CM

## 2020-05-23 LAB — COMPLETE METABOLIC PANEL WITH GFR
AG Ratio: 1.3 (calc) (ref 1.0–2.5)
ALT: 15 U/L (ref 6–29)
AST: 21 U/L (ref 10–35)
Albumin: 3.9 g/dL (ref 3.6–5.1)
Alkaline phosphatase (APISO): 71 U/L (ref 37–153)
BUN: 21 mg/dL (ref 7–25)
CO2: 28 mmol/L (ref 20–32)
Calcium: 9.5 mg/dL (ref 8.6–10.4)
Chloride: 103 mmol/L (ref 98–110)
Creat: 0.63 mg/dL (ref 0.50–0.99)
GFR, Est African American: 110 mL/min/{1.73_m2} (ref >=60)
GFR, Est Non African American: 95 mL/min/{1.73_m2} (ref >=60)
Globulin: 2.9 g/dL (calc) (ref 1.9–3.7)
Glucose, Bld: 89 mg/dL (ref 65–139)
Potassium: 3.8 mmol/L (ref 3.5–5.3)
Sodium: 139 mmol/L (ref 135–146)
Total Bilirubin: 0.2 mg/dL (ref 0.2–1.2)
Total Protein: 6.8 g/dL (ref 6.1–8.1)

## 2020-05-23 LAB — CBC WITH DIFFERENTIAL/PLATELET
Absolute Monocytes: 521 cells/uL (ref 200–950)
Basophils Absolute: 28 cells/uL (ref 0–200)
Basophils Relative: 0.5 %
Eosinophils Absolute: 179 cells/uL (ref 15–500)
Eosinophils Relative: 3.2 %
HCT: 34.2 % — ABNORMAL LOW (ref 35.0–45.0)
Hemoglobin: 11.9 g/dL (ref 11.7–15.5)
Lymphs Abs: 2722 cells/uL (ref 850–3900)
MCH: 32.3 pg (ref 27.0–33.0)
MCHC: 34.8 g/dL (ref 32.0–36.0)
MCV: 92.9 fL (ref 80.0–100.0)
MPV: 10.1 fL (ref 7.5–12.5)
Monocytes Relative: 9.3 %
Neutro Abs: 2150 cells/uL (ref 1500–7800)
Neutrophils Relative %: 38.4 %
Platelets: 217 10*3/uL (ref 140–400)
RBC: 3.68 10*6/uL — ABNORMAL LOW (ref 3.80–5.10)
RDW: 12.1 % (ref 11.0–15.0)
Total Lymphocyte: 48.6 %
WBC: 5.6 10*3/uL (ref 3.8–10.8)

## 2020-05-29 ENCOUNTER — Other Ambulatory Visit: Payer: Self-pay | Admitting: Family Medicine

## 2020-05-29 DIAGNOSIS — I10 Essential (primary) hypertension: Secondary | ICD-10-CM

## 2020-06-13 ENCOUNTER — Telehealth: Payer: Self-pay

## 2020-06-13 NOTE — Telephone Encounter (Signed)
P.A. DILANTIN

## 2020-06-19 NOTE — Progress Notes (Deleted)
Office Visit Note  Patient: Kimberly Butler             Date of Birth: 1955/09/13           MRN: [id number]             PCP: Denita Lung, MD Referring: Denita Lung, MD Visit Date: 07/03/2020 Occupation: @GUAROCC @  Subjective:  No chief complaint on file.   History of Present Illness: Kimberly Butler is a 65 y.o. female ***   Activities of Daily Living:  Patient reports morning stiffness for *** {minute/hour:19697}.   Patient {ACTIONS;DENIES/REPORTS:21021675::"Denies"} nocturnal pain.  Difficulty dressing/grooming: {ACTIONS;DENIES/REPORTS:21021675::"Denies"} Difficulty climbing stairs: {ACTIONS;DENIES/REPORTS:21021675::"Denies"} Difficulty getting out of chair: {ACTIONS;DENIES/REPORTS:21021675::"Denies"} Difficulty using hands for taps, buttons, cutlery, and/or writing: {ACTIONS;DENIES/REPORTS:21021675::"Denies"}  No Rheumatology ROS completed.   PMFS History:  Patient Active Problem List   Diagnosis Date Noted  . Lactose intolerance in adult 11/18/2016  . Post-menopausal 08/28/2016  . High risk medication use 08/28/2016  . History of vitamin D deficiency 08/28/2016  . History of gastroesophageal reflux (GERD) 08/28/2016  . History of iron deficiency anemia 08/28/2016  . Psoriasis 07/07/2014  . Seizure disorder ([place]) 01/07/2011  . Hypertension 01/07/2011  . Psoriatic arthritis ([place]) 01/07/2011  . Allergic rhinitis due to pollen 01/07/2011  . Obesity (BMI 30-39.9) 01/07/2011    Past Medical History:  Diagnosis Date  . Allergy    RHINITIS  . 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 Epilepsy ([place])    hasn't had seizure in 20 years (01/15/17)  . GERD (gastroesophageal reflux disease)   . Hypertension   . Obesity   . Psoriasis   . Psoriatic arthritis ([place])   . Smoker   . Varicose veins     Family History  Problem Relation Age of Onset  . Cancer Mother   . Heart disease Mother   . Arthritis Father   . Kidney failure Father   . Dementia Father   . Diabetes  Father   . Cancer Sister   . Cancer Brother   . Healthy Son    Past Surgical History:  Procedure Laterality Date  . CATARACT EXTRACTION W/ INTRAOCULAR LENS IMPLANT Bilateral   . CESAREAN SECTION    . CHOLECYSTECTOMY N/A 01/23/2017   Procedure: LAPAROSCOPIC CHOLECYSTECTOMY;  Surgeon: Georganna Skeans, MD;  Location: [facility];  Service: General;  Laterality: N/A;  . TONSILECTOMY, ADENOIDECTOMY, BILATERAL MYRINGOTOMY AND TUBES    . TUBAL LIGATION     Social History   Social History Narrative  . Not on file   Immunization History  Administered Date(s) Administered  . DTaP 02/06/1994, 11/15/2004  . Influenza Inj Mdck Quad Pf 03/10/2017  . Influenza Whole 07/24/2009  . Influenza,inj,Quad PF,6+ Mos 03/29/2016, 03/24/2018, 03/22/2019, 03/22/2020  . Influenza-Unspecified 03/10/2014, 03/10/2017  . PPD Test 10/20/1992  . Pneumococcal Conjugate-13 12/18/2007  . Pneumococcal Polysaccharide-23 03/31/2014  . Tdap 03/29/2016  . Zoster Recombinat (Shingrix) 12/15/2018, 03/22/2019     Objective: Vital Signs: There were no vitals taken for this visit.   Physical Exam   Musculoskeletal Exam: ***  CDAI Exam: CDAI Score: - Patient Global: -; Provider Global: - Swollen: -; Tender: - Joint Exam 07/03/2020   No joint exam has been documented for this visit   There is currently no information documented on the homunculus. Go to the Rheumatology activity and complete the homunculus joint exam.  Investigation: No additional findings.  Imaging: No results found.  Recent Labs: Lab Results  Component Value  Date   WBC 5.6 05/22/2020   HGB 11.9 05/22/2020   PLT 217 05/22/2020   NA 139 05/22/2020   K 3.8 05/22/2020   CL 103 05/22/2020   CO2 28 05/22/2020   GLUCOSE 89 05/22/2020   BUN 21 05/22/2020   CREATININE 0.63 05/22/2020   BILITOT 0.2 05/22/2020   ALKPHOS 81 11/05/2019   AST 21 05/22/2020   ALT 15 05/22/2020   PROT 6.8 05/22/2020   ALBUMIN 4.1 11/05/2019   CALCIUM 9.5  05/22/2020   GFRAA 110 05/22/2020   QFTBGOLDPLUS NEGATIVE 03/01/2020    Speciality Comments: No specialty comments available.  Procedures:  No procedures performed Allergies: No known allergies   Assessment / Plan:     Visit Diagnoses: No diagnosis found.  Orders: No orders of the defined types were placed in this encounter.  No orders of the defined types were placed in this encounter.   Face-to-face time spent with patient was *** minutes. Greater than 50% of time was spent in counseling and coordination of care.  Follow-Up Instructions: No follow-ups on file.   Earnestine Mealing, CMA  Note - This record has been created using Editor, commissioning.  Chart creation errors have been sought, but may not always  have been located. Such creation errors do not reflect on  the standard of medical care.

## 2020-06-23 NOTE — Telephone Encounter (Signed)
Response states pt recently filled medication and will be able to fill this at next fill according to plan limits

## 2020-07-01 NOTE — Telephone Encounter (Signed)
done

## 2020-07-03 ENCOUNTER — Ambulatory Visit: Payer: BLUE CROSS/BLUE SHIELD | Admitting: Physician Assistant

## 2020-07-03 DIAGNOSIS — Z8669 Personal history of other diseases of the nervous system and sense organs: Secondary | ICD-10-CM

## 2020-07-03 DIAGNOSIS — M19041 Primary osteoarthritis, right hand: Secondary | ICD-10-CM

## 2020-07-03 DIAGNOSIS — I10 Essential (primary) hypertension: Secondary | ICD-10-CM

## 2020-07-03 DIAGNOSIS — Z79899 Other long term (current) drug therapy: Secondary | ICD-10-CM

## 2020-07-03 DIAGNOSIS — Z8719 Personal history of other diseases of the digestive system: Secondary | ICD-10-CM

## 2020-07-03 DIAGNOSIS — L409 Psoriasis, unspecified: Secondary | ICD-10-CM

## 2020-07-03 DIAGNOSIS — L4050 Arthropathic psoriasis, unspecified: Secondary | ICD-10-CM

## 2020-07-03 DIAGNOSIS — Z862 Personal history of diseases of the blood and blood-forming organs and certain disorders involving the immune mechanism: Secondary | ICD-10-CM

## 2020-07-03 DIAGNOSIS — M170 Bilateral primary osteoarthritis of knee: Secondary | ICD-10-CM

## 2020-07-03 DIAGNOSIS — E559 Vitamin D deficiency, unspecified: Secondary | ICD-10-CM

## 2020-07-03 NOTE — Progress Notes (Signed)
Office Visit Note  Patient: Kimberly Butler             Date of Birth: May 20, 1956           MRN: [id number]             PCP: Denita Lung, MD Referring: Denita Lung, MD Visit Date: 07/17/2020 Occupation: @GUAROCC @  Subjective:  Medication monitoring   History of Present Illness: Kimberly Butler is a 64 y.o. female with history of psoriatic arthritis and osteoarthritis.  She is on enbrel 50 mg sq injections once weekly, methotrexate 0.4 ml sq injections once every other week, and folic acid 1 mg po daily.  She states she has noticed less hair loss and has actually noticed some hair growth since reducing the dose of MTX and spacing the frequency of dosing to every other week. She denies any psoriatic arthritis flares.  She denies any increased joint pain or joint swelling.  She denies any SI joint pain.  She has not noticed any achilles tendonitis or plantar fasciitis. She has been experiencing increased fluid retention in bilateral lower extremities over the past 1 month, which she attributes to not following her normal diet plan.  She has a few small scattered patches of psoriasis on her upper extremities and uses a topical agent on occasion.  She denies any recent infections.      Activities of Daily Living:  Patient reports morning stiffness for 0 minutes.   Patient Denies nocturnal pain.  Difficulty dressing/grooming: Denies Difficulty climbing stairs: Denies Difficulty getting out of chair: Denies Difficulty using hands for taps, buttons, cutlery, and/or writing: Denies  Review of Systems  Constitutional: Negative for fatigue.  HENT: Negative for mouth sores, mouth dryness and nose dryness.   Eyes: Negative for pain, itching and dryness.  Respiratory: Negative for shortness of breath and difficulty breathing.   Cardiovascular: Negative for chest pain and palpitations.  Gastrointestinal: Negative for blood in stool, constipation and diarrhea.  Endocrine: Negative for  increased urination.  Genitourinary: Negative for difficulty urinating.  Musculoskeletal: Positive for arthralgias, joint pain, joint swelling, myalgias and myalgias. Negative for morning stiffness and muscle tenderness.  Skin: Positive for hair loss. Negative for rash and redness.  Allergic/Immunologic: Negative for susceptible to infections.  Neurological: Negative for dizziness, numbness, headaches, memory loss and weakness.  Hematological: Negative for bruising/bleeding tendency.  Psychiatric/Behavioral: Negative for confusion.    PMFS History:  Patient Active Problem List   Diagnosis Date Noted  . Lactose intolerance in adult 11/18/2016  . Post-menopausal 08/28/2016  . High risk medication use 08/28/2016  . History of vitamin D deficiency 08/28/2016  . History of gastroesophageal reflux (GERD) 08/28/2016  . History of iron deficiency anemia 08/28/2016  . Psoriasis 07/07/2014  . Seizure disorder ([place]) 01/07/2011  . Hypertension 01/07/2011  . Psoriatic arthritis ([place]) 01/07/2011  . Allergic rhinitis due to pollen 01/07/2011  . Obesity (BMI 30-39.9) 01/07/2011    Past Medical History:  Diagnosis Date  . Allergy    RHINITIS  . 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 Epilepsy ([place])    hasn't had seizure in 20 years (01/15/17)  . GERD (gastroesophageal reflux disease)   . Hypertension   . Obesity   . Psoriasis   . Psoriatic arthritis ([place])   . Smoker   . Varicose veins     Family History  Problem Relation Age of Onset  . Cancer Mother   . Heart disease Mother   .  Arthritis Father   . Kidney failure Father   . Dementia Father   . Diabetes Father   . Cancer Sister   . Cancer Brother   . Healthy Son    Past Surgical History:  Procedure Laterality Date  . CATARACT EXTRACTION W/ INTRAOCULAR LENS IMPLANT Bilateral   . CESAREAN SECTION    . CHOLECYSTECTOMY N/A 01/23/2017   Procedure: LAPAROSCOPIC CHOLECYSTECTOMY;  Surgeon: Violeta Gelinashompson, Burke, MD;  Location: [facility];  Service:  General;  Laterality: N/A;  . TONSILECTOMY, ADENOIDECTOMY, BILATERAL MYRINGOTOMY AND TUBES    . TUBAL LIGATION     Social History   Social History Narrative  . Not on file   Immunization History  Administered Date(s) Administered  . DTaP 02/06/1994, 11/15/2004  . Influenza Inj Mdck Quad Pf 03/10/2017  . Influenza Whole 07/24/2009  . Influenza,inj,Quad PF,6+ Mos 03/29/2016, 03/24/2018, 03/22/2019, 03/22/2020  . Influenza-Unspecified 03/10/2014, 03/10/2017  . PPD Test 10/20/1992  . Pneumococcal Conjugate-13 12/18/2007  . Pneumococcal Polysaccharide-23 03/31/2014  . Tdap 03/29/2016  . Zoster Recombinat (Shingrix) 12/15/2018, 03/22/2019     Objective: Vital Signs: BP 125/78 (BP Location: Left Arm, Patient Position: Sitting, Cuff Size: Normal)   Pulse (!) 56   Resp 13   Ht 5\' 7"  (1.702 m)   Wt 194 lb 6.4 oz (88.2 kg)   BMI 30.45 kg/m    Physical Exam Vitals and nursing note reviewed.  Constitutional:      Appearance: She is well-developed and well-nourished.  HENT:     Head: Normocephalic and atraumatic.  Eyes:     Extraocular Movements: EOM normal.     Conjunctiva/sclera: Conjunctivae normal.  Cardiovascular:     Pulses: Intact distal pulses.  Pulmonary:     Effort: Pulmonary effort is normal.  Abdominal:     Palpations: Abdomen is soft.  Musculoskeletal:     Cervical back: Normal range of motion.  Skin:    General: Skin is warm and dry.     Capillary Refill: Capillary refill takes less than 2 seconds.  Neurological:     Mental Status: She is alert and oriented to person, place, and time.  Psychiatric:        Mood and Affect: Mood and affect normal.        Behavior: Behavior normal.    Musculoskeletal Exam: C-spine, thoracic spine, lumbar spine have good range of motion with no discomfort.  Shoulder joints, elbow joints, wrist joints, MCPs, PIPs, DIPs have good range of motion with no synovitis.  She is able to make a complete fist bilaterally.  PIP and DIP  thickening consistent with osteoarthritis of both hands noted.  Hip joints, knee joints, ankle joints have good range of motion with no discomfort.  No warmth or effusion of knee joints noted.  No tenderness or swelling of ankle joints.  Pedal edema noted bilaterally.  CDAI Exam: CDAI Score: -- Patient Global: --; Provider Global: -- Swollen: --; Tender: -- Joint Exam 07/17/2020   No joint exam has been documented for this visit   There is currently no information documented on the homunculus. Go to the Rheumatology activity and complete the homunculus joint exam.  Investigation: No additional findings.  Imaging: No results found.  Recent Labs: Lab Results  Component Value Date   WBC 5.6 05/22/2020   HGB 11.9 05/22/2020   PLT 217 05/22/2020   NA 139 05/22/2020   K 3.8 05/22/2020   CL 103 05/22/2020   CO2 28 05/22/2020   GLUCOSE 89 05/22/2020  BUN 21 05/22/2020   CREATININE 0.63 05/22/2020   BILITOT 0.2 05/22/2020   ALKPHOS 81 11/05/2019   AST 21 05/22/2020   ALT 15 05/22/2020   PROT 6.8 05/22/2020   ALBUMIN 4.1 11/05/2019   CALCIUM 9.5 05/22/2020   GFRAA 110 05/22/2020   QFTBGOLDPLUS NEGATIVE 03/01/2020    Speciality Comments: No specialty comments available.  Procedures:  No procedures performed Allergies: No known allergies   Assessment / Plan:     Visit Diagnoses: Psoriatic arthritis ([place]): She has no synovitis or dactylitis on exam.  She has not had any recent psoriatic arthritis flares.  She is clinically doing well on Enbrel 50 mg subcutaneous injections every week, methotrexate 0.4 mL sq injections every other week, and folic acid 2 mg by mouth daily.  She has been spacing the dose of Enbrel which has improved her hair loss.  She started to notice some hair growth while on neutrophil.  She has not had any increased joint pain or inflammation while on low-dose methotrexate.  She has not had any Achilles tendinitis or plantar fasciitis.  No SI joint tenderness.   She has not been experiencing any nocturnal pain or increased morning stiffness.  She has a few scattered patches of psoriasis on her upper extremities but overall her skin clearance has been stable on the current regimen.  She will continue on Enbrel 50 mg sq injections once weekly and methotrexate 0.4 mL sq injections every other week.  She does not need any refills at this time.  She was advised to notify us if she develops increased joint pain or joint swelling. She will follow up in the office in 5 months.   Psoriasis: She has small scattered patches of psoriasis on bilateral upper extremities.  She uses a topical agent on occasion.  She does not need a refill at this time.  She will continue on Enbrel and methotrexate as prescribed.  High risk medication use - Enbrel SureClick 50 mg sq injections every 7 days, methotrexate 0.4 ml sq injections every other week-due to hair loss (taking nutrafol), and folic acid 1 mg 2 tablets daily.  CBC and CMP updated on 05/22/20.  She will be due to update lab work in march and every 3 months.  Standing orders for CBC and CMP remain in place. TB gold negative on 03/01/20 and will continue to be monitored yearly. She has not had any recent infections.  She was advised to hold methotrexate and enbrel if she develops signs or symptoms of an infection and to resume once the infection has completely cleared.   She has her yearly skin exam scheduled.  Primary osteoarthritis of both hands: She has PIP and DIP thickening consistent with osteoarthritis of both hands.  She is able to make a complete fist bilaterally.  No joint tenderness or inflammation was noted.  Primary osteoarthritis of both knees: She has good range of motion of both knee joints with no discomfort.  No warmth or effusion.  Bilateral knee crepitus was noted.  Vitamin D deficiency: She is taking vitamin D 5000 units daily.  Essential hypertension: Blood pressure was 125/78 today in the office.  Other  medical conditions are listed as follows:  History of gastroesophageal reflux (GERD)  History of epilepsy  History of iron deficiency anemia  Orders: No orders of the defined types were placed in this encounter.  No orders of the defined types were placed in this encounter.    Follow-Up Instructions: Return in about 5  months (around 12/14/2020) for Psoriatic arthritis.   Ofilia Neas, PA-C  Note - This record has been created using Dragon software.  Chart creation errors have been sought, but may not always  have been located. Such creation errors do not reflect on  the standard of medical care.

## 2020-07-17 ENCOUNTER — Encounter: Payer: Self-pay | Admitting: Physician Assistant

## 2020-07-17 ENCOUNTER — Other Ambulatory Visit: Payer: Self-pay

## 2020-07-17 ENCOUNTER — Ambulatory Visit (INDEPENDENT_AMBULATORY_CARE_PROVIDER_SITE_OTHER): Payer: BLUE CROSS/BLUE SHIELD | Admitting: Physician Assistant

## 2020-07-17 VITALS — BP 125/78 | HR 56 | Resp 13 | Ht 67.0 in | Wt 194.4 lb

## 2020-07-17 DIAGNOSIS — L409 Psoriasis, unspecified: Secondary | ICD-10-CM

## 2020-07-17 DIAGNOSIS — M19041 Primary osteoarthritis, right hand: Secondary | ICD-10-CM

## 2020-07-17 DIAGNOSIS — I10 Essential (primary) hypertension: Secondary | ICD-10-CM

## 2020-07-17 DIAGNOSIS — M170 Bilateral primary osteoarthritis of knee: Secondary | ICD-10-CM

## 2020-07-17 DIAGNOSIS — Z79899 Other long term (current) drug therapy: Secondary | ICD-10-CM

## 2020-07-17 DIAGNOSIS — E559 Vitamin D deficiency, unspecified: Secondary | ICD-10-CM

## 2020-07-17 DIAGNOSIS — Z8669 Personal history of other diseases of the nervous system and sense organs: Secondary | ICD-10-CM

## 2020-07-17 DIAGNOSIS — M19042 Primary osteoarthritis, left hand: Secondary | ICD-10-CM

## 2020-07-17 DIAGNOSIS — Z8719 Personal history of other diseases of the digestive system: Secondary | ICD-10-CM

## 2020-07-17 DIAGNOSIS — Z862 Personal history of diseases of the blood and blood-forming organs and certain disorders involving the immune mechanism: Secondary | ICD-10-CM

## 2020-07-17 DIAGNOSIS — L4050 Arthropathic psoriasis, unspecified: Secondary | ICD-10-CM | POA: Diagnosis not present

## 2020-07-17 NOTE — Patient Instructions (Signed)
Standing Labs We placed an order today for your standing lab work.   Please have your standing labs drawn in March and every 3 months   If possible, please have your labs drawn 2 weeks prior to your appointment so that the provider can discuss your results at your appointment.  We have open lab daily Monday through Thursday from 1:30-4:30 PM and Friday from 1:30-4:00 PM at the office of Dr. Shaili Deveshwar, [facility].   Please be advised, all patients with office appointments requiring lab work will take precedents over walk-in lab work.  If possible, please come for your lab work on Monday and Friday afternoons, as you may experience shorter wait times. The office is located at [address], [address], [place], [place] [address] No appointment is necessary.   Labs are drawn by [facility]. Please bring your co-pay at the time of your lab draw.  You may receive a bill from [facility] for your lab work.  If you wish to have your labs drawn at another location, please call the office 24 hours in advance to send orders.  If you have any questions regarding directions or hours of operation,  please call [phone].   As a reminder, please drink plenty of water prior to coming for your lab work. Thanks!   

## 2020-07-18 ENCOUNTER — Other Ambulatory Visit: Payer: Self-pay | Admitting: Rheumatology

## 2020-07-18 NOTE — Telephone Encounter (Signed)
Last Visit: 07/17/2020 Next Visit: 12/18/2020 Labs: 05/22/2020, CMP WNL. RBC count and hematocrit are borderline low but stable. Rest of CBC WNL.   TB Gold: 03/01/2020, negative  Current Dose per office note 08/15/9442, Enbrel SureClick 50 mg sq injections every 7 days  QF:JUVQQUIVH arthritis   Last Fill:  05/16/2020  Okay to refill Enbrel?

## 2020-07-31 ENCOUNTER — Other Ambulatory Visit: Payer: Self-pay

## 2020-07-31 ENCOUNTER — Ambulatory Visit: Payer: BLUE CROSS/BLUE SHIELD | Admitting: Family Medicine

## 2020-07-31 ENCOUNTER — Encounter: Payer: Self-pay | Admitting: Family Medicine

## 2020-07-31 ENCOUNTER — Other Ambulatory Visit (HOSPITAL_COMMUNITY)
Admission: RE | Admit: 2020-07-31 | Discharge: 2020-07-31 | Disposition: A | Discharge status: Home / Self Care | Payer: BLUE CROSS/BLUE SHIELD | Source: Ambulatory Visit | Attending: Family Medicine | Admitting: Family Medicine

## 2020-07-31 VITALS — BP 110/70 | HR 62 | Temp 97.8°F | Ht 65.0 in | Wt 193.8 lb

## 2020-07-31 DIAGNOSIS — Z8616 Personal history of COVID-19: Secondary | ICD-10-CM

## 2020-07-31 DIAGNOSIS — E739 Lactose intolerance, unspecified: Secondary | ICD-10-CM

## 2020-07-31 DIAGNOSIS — Z124 Encounter for screening for malignant neoplasm of cervix: Secondary | ICD-10-CM

## 2020-07-31 DIAGNOSIS — Z630 Problems in relationship with spouse or partner: Secondary | ICD-10-CM

## 2020-07-31 DIAGNOSIS — Z0000 Encounter for general adult medical examination without abnormal findings: Secondary | ICD-10-CM

## 2020-07-31 DIAGNOSIS — I10 Essential (primary) hypertension: Secondary | ICD-10-CM

## 2020-07-31 DIAGNOSIS — Z862 Personal history of diseases of the blood and blood-forming organs and certain disorders involving the immune mechanism: Secondary | ICD-10-CM

## 2020-07-31 DIAGNOSIS — G40909 Epilepsy, unspecified, not intractable, without status epilepticus: Secondary | ICD-10-CM

## 2020-07-31 DIAGNOSIS — E669 Obesity, unspecified: Secondary | ICD-10-CM

## 2020-07-31 DIAGNOSIS — L409 Psoriasis, unspecified: Secondary | ICD-10-CM

## 2020-07-31 DIAGNOSIS — L4050 Arthropathic psoriasis, unspecified: Secondary | ICD-10-CM

## 2020-07-31 NOTE — Progress Notes (Signed)
   Subjective:    Patient ID: Kimberly Butler, female    DOB: 1955-08-29, 65 y.o.   MRN: [id number]  HPI She is here for complete examination.  She does have an underlying seizure disorder and is doing well on her present Dilantin dosing.  She also has history of psoriatic arthritis and is on methotrexate as well as Enbrel.  She is being followed by rheumatology for this and is doing quite well.  Her weight is down as she has made some diet and exercise changes.  This was mainly due to the girlfriend of her son who is a Microbiologist and has been working with her on this.  She feels much better since she is on this new diet and has lost several pounds.  She does have a history of lactose intolerance and iron deficiency anemia.  She continues on dietary supplements as well as losartan/HCTZ, folic acid, iron supplements.  She also has a history of having Covid in June 2021.  At this point she seems to recovered without any residuals.  She has been having difficulty with marital stress.  She and her husband are no longer living together.  She is now living in an apartment.  Apparently he is using marijuana and drinking excessively.  She has been in contact with a lawyer who is counseling her on all of her options.  Otherwise her family and social history as well as health maintenance and immunizations was reviewed.   Review of Systems  All other systems reviewed and are negative.      Objective:   Physical Exam Alert and in no distress. Tympanic membranes and canals are normal. Pharyngeal area is normal. Neck is supple without adenopathy or thyromegaly. Cardiac exam shows a regular sinus rhythm without murmurs or gallops. Lungs are clear to auscultation.  Abdominal exam shows no masses or tenderness with normal bowel sounds.  Pelvic exam shows no masses.  Pap smear taken.       Assessment & Plan:  Routine general medical examination at a health care facility  Seizure disorder [facility])  Obesity (BMI  30-39.9) - Plan: Lipid panel  Psoriasis  Psoriatic arthritis ([place])  Essential hypertension  Lactose intolerance in adult  Screening for cervical cancer - Plan: Cytology - PAP([facility])  Marital stress  History of iron deficiency anemia She will continue on her present medication regimen.  I complemented her on her weight loss.  Also talked to her extensively about her impending divorce.  She does have a Chief Executive Officer and has made appropriate steps to protect herself.  I strongly encouraged her to continue with that and listen to the advice that she is getting from her lawyers concerning her options. Discussed getting the Covid vaccine however at this point she is not interested in that.

## 2020-08-01 LAB — LIPID PANEL
Chol/HDL Ratio: 2.1 ratio (ref 0.0–4.4)
Cholesterol, Total: 224 mg/dL — ABNORMAL HIGH (ref 100–199)
HDL: 106 mg/dL (ref >39)
LDL Chol Calc (NIH): 104 mg/dL — ABNORMAL HIGH (ref 0–99)
Triglycerides: 83 mg/dL (ref 0–149)
VLDL Cholesterol Cal: 14 mg/dL (ref 5–40)

## 2020-08-02 LAB — CYTOLOGY - PAP
Adequacy: ABSENT
Diagnosis: NEGATIVE

## 2020-08-15 ENCOUNTER — Telehealth: Payer: Self-pay

## 2020-08-15 NOTE — Telephone Encounter (Signed)
Pt called and states needs P.A. for Dilantin.  States has been on this since she was like 65 years old.  States her specialist did try her on another medication years ago she couldn't remember the name but had bad reaction.  She is very concerned over trying the generic because of issues she has had in the past

## 2020-08-15 NOTE — Telephone Encounter (Signed)
P.A. DILANTIN completed

## 2020-08-18 NOTE — Telephone Encounter (Signed)
P.A. approved til 08/15/21, activated discount card pharmacy & pt informed

## 2020-09-07 ENCOUNTER — Other Ambulatory Visit: Payer: Self-pay | Admitting: Rheumatology

## 2020-09-07 NOTE — Telephone Encounter (Signed)
Next Visit: 12/18/2020  Last Visit: 2/72/022  Last Fill: 11/09/2019  DX: Psoriatic arthritis  Current Dose per office note 07/17/2020, methotrexate 0.4 ml sq injections every other week-due to hair loss (taking nutrafol)  Labs: 05/22/2020, CMP WNL. RBC count and hematocrit are borderline low but stable. Rest of CBC WNL.   Patient will come 09/08/2020 for labs.  Okay to refill MTX?

## 2020-09-25 ENCOUNTER — Other Ambulatory Visit: Payer: Self-pay | Admitting: Family Medicine

## 2020-09-25 DIAGNOSIS — I10 Essential (primary) hypertension: Secondary | ICD-10-CM

## 2020-10-31 ENCOUNTER — Other Ambulatory Visit: Payer: Self-pay | Admitting: Physician Assistant

## 2020-10-31 DIAGNOSIS — Z79899 Other long term (current) drug therapy: Secondary | ICD-10-CM

## 2020-10-31 DIAGNOSIS — L4050 Arthropathic psoriasis, unspecified: Secondary | ICD-10-CM

## 2020-10-31 DIAGNOSIS — L409 Psoriasis, unspecified: Secondary | ICD-10-CM

## 2020-10-31 NOTE — Telephone Encounter (Signed)
Next Visit: 12/18/2020  Last Visit: 07/17/2020  Last Fill: 09/07/2020  DX: Psoriatic arthritis   Current Dose per office note 07/17/2020, methotrexate 0.4 ml sq injections every other week-due to hair loss   Labs: 05/22/2020, CMP WNL. RBC count and hematocrit are borderline low but stable. Rest of CBC WNL.   I called patient, labs are due. Patient will have labs drawn 11/01/2020.  Okay to refill MTX?

## 2020-11-01 ENCOUNTER — Other Ambulatory Visit: Payer: Self-pay | Admitting: *Deleted

## 2020-11-01 DIAGNOSIS — L4050 Arthropathic psoriasis, unspecified: Secondary | ICD-10-CM

## 2020-11-01 DIAGNOSIS — L409 Psoriasis, unspecified: Secondary | ICD-10-CM

## 2020-11-01 DIAGNOSIS — Z79899 Other long term (current) drug therapy: Secondary | ICD-10-CM

## 2020-11-02 LAB — COMPLETE METABOLIC PANEL WITH GFR
AG Ratio: 1.5 (calc) (ref 1.0–2.5)
ALT: 17 U/L (ref 6–29)
AST: 21 U/L (ref 10–35)
Albumin: 4.2 g/dL (ref 3.6–5.1)
Alkaline phosphatase (APISO): 71 U/L (ref 37–153)
BUN: 17 mg/dL (ref 7–25)
CO2: 30 mmol/L (ref 20–32)
Calcium: 9.4 mg/dL (ref 8.6–10.4)
Chloride: 102 mmol/L (ref 98–110)
Creat: 0.84 mg/dL (ref 0.50–0.99)
GFR, Est African American: 85 mL/min/{1.73_m2} (ref >=60)
GFR, Est Non African American: 73 mL/min/{1.73_m2} (ref >=60)
Globulin: 2.8 g/dL (calc) (ref 1.9–3.7)
Glucose, Bld: 86 mg/dL (ref 65–99)
Potassium: 3.9 mmol/L (ref 3.5–5.3)
Sodium: 138 mmol/L (ref 135–146)
Total Bilirubin: 0.3 mg/dL (ref 0.2–1.2)
Total Protein: 7 g/dL (ref 6.1–8.1)

## 2020-11-02 LAB — CBC WITH DIFFERENTIAL/PLATELET
Absolute Monocytes: 635 cells/uL (ref 200–950)
Basophils Absolute: 29 cells/uL (ref 0–200)
Basophils Relative: 0.4 %
Eosinophils Absolute: 183 cells/uL (ref 15–500)
Eosinophils Relative: 2.5 %
HCT: 36 % (ref 35.0–45.0)
Hemoglobin: 12 g/dL (ref 11.7–15.5)
Lymphs Abs: 2708 cells/uL (ref 850–3900)
MCH: 31 pg (ref 27.0–33.0)
MCHC: 33.3 g/dL (ref 32.0–36.0)
MCV: 93 fL (ref 80.0–100.0)
MPV: 10.3 fL (ref 7.5–12.5)
Monocytes Relative: 8.7 %
Neutro Abs: 3745 cells/uL (ref 1500–7800)
Neutrophils Relative %: 51.3 %
Platelets: 229 10*3/uL (ref 140–400)
RBC: 3.87 10*6/uL (ref 3.80–5.10)
RDW: 12.4 % (ref 11.0–15.0)
Total Lymphocyte: 37.1 %
WBC: 7.3 10*3/uL (ref 3.8–10.8)

## 2020-11-02 NOTE — Progress Notes (Signed)
CBC and CMP WNL

## 2020-12-04 NOTE — Progress Notes (Deleted)
Office Visit Note  Patient: Kimberly Butler             Date of Birth: 1955-06-24           MRN: [id number]             PCP: Denita Lung, MD Referring: Denita Lung, MD Visit Date: 12/18/2020 Occupation: @GUAROCC @  Subjective:  No chief complaint on file.   History of Present Illness: Kimberly Butler is a 65 y.o. female ***   Activities of Daily Living:  Patient reports morning stiffness for *** {minute/hour:19697}.   Patient {ACTIONS;DENIES/REPORTS:21021675::"Denies"} nocturnal pain.  Difficulty dressing/grooming: {ACTIONS;DENIES/REPORTS:21021675::"Denies"} Difficulty climbing stairs: {ACTIONS;DENIES/REPORTS:21021675::"Denies"} Difficulty getting out of chair: {ACTIONS;DENIES/REPORTS:21021675::"Denies"} Difficulty using hands for taps, buttons, cutlery, and/or writing: {ACTIONS;DENIES/REPORTS:21021675::"Denies"}  No Rheumatology ROS completed.   PMFS History:  Patient Active Problem List   Diagnosis Date Noted   History of COVID-19 07/31/2020   Lactose intolerance in adult 11/18/2016   Post-menopausal 08/28/2016   High risk medication use 08/28/2016   History of vitamin D deficiency 08/28/2016   History of gastroesophageal reflux (GERD) 08/28/2016   History of iron deficiency anemia 08/28/2016   Psoriasis 07/07/2014   Seizure disorder ([place]) 01/07/2011   Hypertension 01/07/2011   Psoriatic arthritis ([place]) 01/07/2011   Allergic rhinitis due to pollen 01/07/2011   Obesity (BMI 30-39.9) 01/07/2011    Past Medical History:  Diagnosis Date   Allergy    RHINITIS   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   Epilepsy ([place])    hasn't had seizure in 20 years (01/15/17)   GERD (gastroesophageal reflux disease)    Hypertension    Obesity    Psoriasis    Psoriatic arthritis ([place])    Smoker    Varicose veins     Family History  Problem Relation Age of Onset   Cancer Mother    Heart disease Mother    Arthritis Father    Kidney failure Father    Dementia Father     Diabetes Father    Cancer Sister    Cancer Brother    Healthy Son    Past Surgical History:  Procedure Laterality Date   CATARACT EXTRACTION W/ INTRAOCULAR LENS IMPLANT Bilateral    CESAREAN SECTION     CHOLECYSTECTOMY N/A 01/23/2017   Procedure: LAPAROSCOPIC CHOLECYSTECTOMY;  Surgeon: Georganna Skeans, MD;  Location: [place];  Service: General;  Laterality: N/A;   TONSILECTOMY, ADENOIDECTOMY, BILATERAL MYRINGOTOMY AND TUBES     TUBAL LIGATION     Social History   Social History Narrative   Not on file   Immunization History  Administered Date(s) Administered   DTaP 02/06/1994, 11/15/2004   Influenza Inj Mdck Quad Pf 03/10/2017   Influenza Whole 07/24/2009   Influenza,inj,Quad PF,6+ Mos 03/29/2016, 03/24/2018, 03/22/2019, 03/22/2020   Influenza-Unspecified 03/10/2014, 03/10/2017   PPD Test 10/20/1992   Pneumococcal Conjugate-13 12/18/2007   Pneumococcal Polysaccharide-23 03/31/2014   Tdap 03/29/2016   Zoster Recombinat (Shingrix) 12/15/2018, 03/22/2019     Objective: Vital Signs: There were no vitals taken for this visit.   Physical Exam   Musculoskeletal Exam: ***  CDAI Exam: CDAI Score: -- Patient Global: --; Provider Global: -- Swollen: --; Tender: -- Joint Exam 12/18/2020   No joint exam has been documented for this visit   There is currently no information documented on the homunculus. Go to the Rheumatology activity and complete the homunculus joint exam.  Investigation: No additional findings.  Imaging: No results found.  Recent  Labs: Lab Results  Component Value Date   WBC 7.3 11/01/2020   HGB 12.0 11/01/2020   PLT 229 11/01/2020   NA 138 11/01/2020   K 3.9 11/01/2020   CL 102 11/01/2020   CO2 30 11/01/2020   GLUCOSE 86 11/01/2020   BUN 17 11/01/2020   CREATININE 0.84 11/01/2020   BILITOT 0.3 11/01/2020   ALKPHOS 81 11/05/2019   AST 21 11/01/2020   ALT 17 11/01/2020   PROT 7.0 11/01/2020   ALBUMIN 4.1 11/05/2019   CALCIUM 9.4 11/01/2020    GFRAA 85 11/01/2020   QFTBGOLDPLUS NEGATIVE 03/01/2020    Speciality Comments: No specialty comments available.  Procedures:  No procedures performed Allergies: No known allergies   Assessment / Plan:     Visit Diagnoses: No diagnosis found.  Orders: No orders of the defined types were placed in this encounter.  No orders of the defined types were placed in this encounter.   Face-to-face time spent with patient was *** minutes. Greater than 50% of time was spent in counseling and coordination of care.  Follow-Up Instructions: No follow-ups on file.   Earnestine Mealing, CMA  Note - This record has been created using Editor, commissioning.  Chart creation errors have been sought, but may not always  have been located. Such creation errors do not reflect on  the standard of medical care.

## 2020-12-18 ENCOUNTER — Ambulatory Visit: Payer: BLUE CROSS/BLUE SHIELD | Admitting: Rheumatology

## 2020-12-21 ENCOUNTER — Telehealth: Payer: BLUE CROSS/BLUE SHIELD | Admitting: Family Medicine

## 2020-12-21 ENCOUNTER — Other Ambulatory Visit: Payer: Self-pay

## 2020-12-21 ENCOUNTER — Encounter: Payer: Self-pay | Admitting: Family Medicine

## 2020-12-21 VITALS — Temp 98.0°F | Ht 65.0 in | Wt 190.0 lb

## 2020-12-21 DIAGNOSIS — J069 Acute upper respiratory infection, unspecified: Secondary | ICD-10-CM | POA: Diagnosis not present

## 2020-12-21 DIAGNOSIS — J3489 Other specified disorders of nose and nasal sinuses: Secondary | ICD-10-CM

## 2020-12-21 NOTE — Progress Notes (Signed)
Start time: 2:52 End time: 3:14   Virtual Visit via Video/telephone Note  I connected with Kimberly Butler on 12/21/20 by a video enabled telemedicine application and verified that I am speaking with the correct person using two identifiers. She was in her car, connection was poor, audio not working well.  Switched to phone call in order to hear each other and perform the visit.  Location: Patient: in car, outside of work Provider: office   I discussed the limitations of evaluation and management by telemedicine and the availability of in person appointments. The patient expressed understanding and agreed to proceed.  History of Present Illness:  Chief Complaint  Patient presents with   Cough    VIRTUAL cough and sinus pain and pressure. Mucus is light yellow. Had fever chill and body aches, not this week. Symptoms started last week on around Tuesday. No home covid tests were done.    9 days ago she left work with a fever, stayed home for 2 days. Went back on Saturday, thought she felt better, but she developed sinus pain. Now she feels like it has gone down into her chest. Never did COVID test, didn't feel like when she had COVID previously.  Her nasal drainage is very thick, clear. She has some pain at the bridge of her nose, not elsewhere in face.  When she gets phlegm up, it is yellow--only getting it up once or twice during the day, with a lot of coughing to get up small amount.  She has used a saline spray, no other OTC meds. It helps her get more out from her nose.  Using Vick's drops to keep her nose open, having a lot of congestion.  Hasn't had any COVID vaccines   PMH, PSH, SH reviewed  nonsmoker  Outpatient Encounter Medications as of 12/21/2020  Medication Sig   calcium carbonate (TUMS - DOSED IN MG ELEMENTAL CALCIUM) 500 MG chewable tablet Chew 1-2 tablets by mouth as needed for indigestion or heartburn (depends on indigestion if takes 1-2 tablets).    cholecalciferol (VITAMIN D) 1000 units tablet Take 5,000 Units by mouth daily.    DILANTIN 100 MG ER capsule TAKE 2 CAPSULES BY MOUTH TWICE A DAY   ENBREL SURECLICK 50 MG/ML injection INJECT 1 PEN UNDER THE SKIN EVERY 7 DAYS.   folic acid (FOLVITE) 1 MG tablet TAKE 2 TABLETS BY MOUTH EVERY DAY   hydrochlorothiazide (MICROZIDE) 12.5 MG capsule TAKE 1 CAPSULE DAILY   losartan (COZAAR) 50 MG tablet TAKE 1 TABLET DAILY   Methotrexate Sodium (METHOTREXATE, PF,) 50 MG/2ML injection INJECT 0.4 MLS INTO THE SKIN EVERY OTHER WEEK *SINGLE USE VIALS*   Multiple Vitamins-Minerals (MULTIVITAMIN WITH MINERALS) tablet Take 1 tablet by mouth daily.   Turmeric 500 MG CAPS Take 500 mg by mouth daily.   vitamin B-12 (CYANOCOBALAMIN) 1000 MCG tablet Take 1,000 mcg by mouth daily.   [DISCONTINUED] ferrous gluconate (FERGON) 225 (27 Fe) MG tablet Take 240 mg by mouth as needed.    No facility-administered encounter medications on file as of 12/21/2020.   Allergies  Allergen Reactions   No Known Allergies    ROS: no further fever, no chills. No n/v/d, bleeding, bruising, rashes.  No headaches, dizziness.  Denies shortness of breath. Not much cough--just when trying to get up phlegm, from PND. No coughing at night    Observations/Objective:  Temp 98 F (36.7 C) (Tympanic)   Ht 5\' 5"  (1.651 m)   Wt 190 lb (86.2 kg)  BMI 31.62 kg/m   Well appearing female, in no distress She is speaking easily. Brief video showed normal appearance, in car. Rare cough during visit. Exam is limited due to virtual nature of the visit  Assessment and Plan:  Upper respiratory tract infection, unspecified type - Sounds viral--can't r/o COVID.  At this point, no e/o sinus infection, just congestion. Reviewed s/sx infection, call for ABX. COVID test rec (still <10d)  Sinus pressure - discussed Mucinex, sinus rinses.  If mucus becomes discolored, can reach out for ABX  Not around young children, can rx amox if develops sx  suggestive of bacterial infection.    Follow Up Instructions:    I discussed the assessment and treatment plan with the patient. The patient was provided an opportunity to ask questions and all were answered. The patient agreed with the plan and demonstrated an understanding of the instructions.   The patient was advised to call back or seek an in-person evaluation if the symptoms worsen or if the condition fails to improve as anticipated.  I spent 24 minutes dedicated to the care of this patient, including pre-visit review of records, face to face time, post-visit ordering of testing and documentation.    Vikki Ports, MD

## 2020-12-21 NOTE — Patient Instructions (Signed)
  Take a home COVID test. Different strains may present differently. It is possible that you've had COVID, and may be spreading it to others.  You are to isolate for 10 days (with day 0 being the first day of symptoms, last Tuesday, Day 1 being Wednesday). You haven't been vaccinated, which could put you at higher risk for complication and serious illness (if you test positive for COVID).  Unfortunately, you would be past the window for any of the treatments we currently have to offer. Hopefully your test will be negative, and you have a different virus causing your symptoms.  Stay well hydrated, as this keeps the mucus thinner. Take Mucinex plain or DM (if coughing) or separate Delsym cough syrup to use if needed for cough, along with the plain Mucinex (NOT along with the DM, as they have the same ingredient).  You can try using sinus rinse kit or Neti-pot for sinus rinses once or twice daily to help alleviate any sinus pressure.  If your mucus from the nose, or the phlegm that you cough up, becomes consistently yellow or green, or if you develop any fever or worsening sinus pain, please contact us for an antibiotic prescription.

## 2020-12-22 LAB — HM MAMMOGRAPHY

## 2021-01-11 ENCOUNTER — Other Ambulatory Visit: Payer: Self-pay | Admitting: Rheumatology

## 2021-01-11 NOTE — Telephone Encounter (Signed)
Next Visit: 01/26/2021   Last Visit: 07/17/2020   Last Fill: 07/18/2020  DX: Psoriatic arthritis    Current Dose per office note [id number], Enbrel SureClick 50 mg sq injections every 7 days  Labs: 11/01/2020 CBC and CMP WNL  TB Gold: 03/01/2020 Neg   Okay to refill Enbrel?

## 2021-01-12 NOTE — Progress Notes (Deleted)
Office Visit Note  Patient: Kimberly Butler             Date of Birth: 22-Oct-1955           MRN: [id number]             PCP: Denita Lung, MD Referring: Denita Lung, MD Visit Date: 01/26/2021 Occupation: '@GUAROCC'$ @  Subjective:  No chief complaint on file.   History of Present Illness: Kimberly Butler is a 65 y.o. female ***   Activities of Daily Living:  Patient reports morning stiffness for *** {minute/hour:19697}.   Patient {ACTIONS;DENIES/REPORTS:21021675::"Denies"} nocturnal pain.  Difficulty dressing/grooming: {ACTIONS;DENIES/REPORTS:21021675::"Denies"} Difficulty climbing stairs: {ACTIONS;DENIES/REPORTS:21021675::"Denies"} Difficulty getting out of chair: {ACTIONS;DENIES/REPORTS:21021675::"Denies"} Difficulty using hands for taps, buttons, cutlery, and/or writing: {ACTIONS;DENIES/REPORTS:21021675::"Denies"}  No Rheumatology ROS completed.   PMFS History:  Patient Active Problem List   Diagnosis Date Noted   History of COVID-19 07/31/2020   Lactose intolerance in adult 11/18/2016   Post-menopausal 08/28/2016   High risk medication use 08/28/2016   History of vitamin D deficiency 08/28/2016   History of gastroesophageal reflux (GERD) 08/28/2016   History of iron deficiency anemia 08/28/2016   Psoriasis 07/07/2014   Seizure disorder ([place]) 01/07/2011   Hypertension 01/07/2011   Psoriatic arthritis ([place]) 01/07/2011   Allergic rhinitis due to pollen 01/07/2011   Obesity (BMI 30-39.9) 01/07/2011    Past Medical History:  Diagnosis Date   Allergy    RHINITIS   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   Epilepsy ([place])    hasn't had seizure in 20 years (01/15/17)   GERD (gastroesophageal reflux disease)    Hypertension    Obesity    Psoriasis    Psoriatic arthritis ([place])    Smoker    Varicose veins     Family History  Problem Relation Age of Onset   Cancer Mother    Heart disease Mother    Arthritis Father    Kidney failure Father    Dementia Father     Diabetes Father    Cancer Sister    Cancer Brother    Healthy Son    Past Surgical History:  Procedure Laterality Date   CATARACT EXTRACTION W/ INTRAOCULAR LENS IMPLANT Bilateral    CESAREAN SECTION     CHOLECYSTECTOMY N/A 01/23/2017   Procedure: LAPAROSCOPIC CHOLECYSTECTOMY;  Surgeon: Georganna Skeans, MD;  Location: [place];  Service: General;  Laterality: N/A;   TONSILECTOMY, ADENOIDECTOMY, BILATERAL MYRINGOTOMY AND TUBES     TUBAL LIGATION     Social History   Social History Narrative   Not on file   Immunization History  Administered Date(s) Administered   DTaP 02/06/1994, 11/15/2004   Influenza Inj Mdck Quad Pf 03/10/2017   Influenza Whole 07/24/2009   Influenza,inj,Quad PF,6+ Mos 03/29/2016, 03/24/2018, 03/22/2019, 03/22/2020   Influenza-Unspecified 03/10/2014, 03/10/2017   PPD Test 10/20/1992   Pneumococcal Conjugate-13 12/18/2007   Pneumococcal Polysaccharide-23 03/31/2014   Tdap 03/29/2016   Zoster Recombinat (Shingrix) 12/15/2018, 03/22/2019     Objective: Vital Signs: There were no vitals taken for this visit.   Physical Exam   Musculoskeletal Exam: ***  CDAI Exam: CDAI Score: -- Patient Global: --; Provider Global: -- Swollen: --; Tender: -- Joint Exam 01/26/2021   No joint exam has been documented for this visit   There is currently no information documented on the homunculus. Go to the Rheumatology activity and complete the homunculus joint exam.  Investigation: No additional findings.  Imaging: No results found.  Recent  Labs: Lab Results  Component Value Date   WBC 7.3 11/01/2020   HGB 12.0 11/01/2020   PLT 229 11/01/2020   NA 138 11/01/2020   K 3.9 11/01/2020   CL 102 11/01/2020   CO2 30 11/01/2020   GLUCOSE 86 11/01/2020   BUN 17 11/01/2020   CREATININE 0.84 11/01/2020   BILITOT 0.3 11/01/2020   ALKPHOS 81 11/05/2019   AST 21 11/01/2020   ALT 17 11/01/2020   PROT 7.0 11/01/2020   ALBUMIN 4.1 11/05/2019   CALCIUM 9.4 11/01/2020    GFRAA 85 11/01/2020   QFTBGOLDPLUS NEGATIVE 03/01/2020    Speciality Comments: No specialty comments available.  Procedures:  No procedures performed Allergies: No known allergies   Assessment / Plan:     Visit Diagnoses: Psoriatic arthritis ([place])  Psoriasis  High risk medication use  Primary osteoarthritis of both hands  Primary osteoarthritis of both knees  Vitamin D deficiency  Essential hypertension  History of gastroesophageal reflux (GERD)  History of epilepsy  History of iron deficiency anemia  History of anemia  Orders: No orders of the defined types were placed in this encounter.  No orders of the defined types were placed in this encounter.   Face-to-face time spent with patient was *** minutes. Greater than 50% of time was spent in counseling and coordination of care.  Follow-Up Instructions: No follow-ups on file.   Ofilia Neas, PA-C  Note - This record has been created using Dragon software.  Chart creation errors have been sought, but may not always  have been located. Such creation errors do not reflect on  the standard of medical care.

## 2021-01-26 ENCOUNTER — Other Ambulatory Visit: Payer: Self-pay | Admitting: Family Medicine

## 2021-01-26 ENCOUNTER — Ambulatory Visit: Payer: BLUE CROSS/BLUE SHIELD | Admitting: Rheumatology

## 2021-01-26 DIAGNOSIS — Z8669 Personal history of other diseases of the nervous system and sense organs: Secondary | ICD-10-CM

## 2021-01-26 DIAGNOSIS — Z79899 Other long term (current) drug therapy: Secondary | ICD-10-CM

## 2021-01-26 DIAGNOSIS — Z862 Personal history of diseases of the blood and blood-forming organs and certain disorders involving the immune mechanism: Secondary | ICD-10-CM

## 2021-01-26 DIAGNOSIS — L4050 Arthropathic psoriasis, unspecified: Secondary | ICD-10-CM

## 2021-01-26 DIAGNOSIS — L409 Psoriasis, unspecified: Secondary | ICD-10-CM

## 2021-01-26 DIAGNOSIS — E559 Vitamin D deficiency, unspecified: Secondary | ICD-10-CM

## 2021-01-26 DIAGNOSIS — Z8719 Personal history of other diseases of the digestive system: Secondary | ICD-10-CM

## 2021-01-26 DIAGNOSIS — M170 Bilateral primary osteoarthritis of knee: Secondary | ICD-10-CM

## 2021-01-26 DIAGNOSIS — I10 Essential (primary) hypertension: Secondary | ICD-10-CM

## 2021-01-26 DIAGNOSIS — M19041 Primary osteoarthritis, right hand: Secondary | ICD-10-CM

## 2021-02-22 ENCOUNTER — Other Ambulatory Visit: Payer: Self-pay

## 2021-02-22 DIAGNOSIS — Z9225 Personal history of immunosupression therapy: Secondary | ICD-10-CM

## 2021-02-22 DIAGNOSIS — Z111 Encounter for screening for respiratory tuberculosis: Secondary | ICD-10-CM

## 2021-02-22 MED ORDER — ENBREL SURECLICK 50 MG/ML ~~LOC~~ SOAJ: SUBCUTANEOUS | 0 refills | Status: DC

## 2021-02-22 NOTE — Telephone Encounter (Signed)
Patient called stating she has new insurance [facility] which has been entered into [employer].  Patient states her specialty pharmacy is now [facility].  Patient states [employer] needs authorization before they will prescribe her Enbrel medication.  Bracey Lincoln Member ID:  [id number] Group: [id number] RxBin:  [id number] RxPcn:  [facility]

## 2021-02-22 NOTE — Telephone Encounter (Signed)
Patient states she will need a refill on her Enbrel    Next Visit: 03/15/2021  Last Visit: 07/17/2020  DX: Psoriatic arthritis   Current Dose per office note [id number]:  Enbrel SureClick 50 mg sq injections every 7 days  Labs: 11/01/2020 CBC and CMP WNL  TB Gold: 03/01/2020 Neg    Patient advised she is due to update labs. Patient states she will update on Monday  Okay to refill Enbrel?

## 2021-02-23 ENCOUNTER — Telehealth: Payer: Self-pay | Admitting: Pharmacist

## 2021-02-23 NOTE — Telephone Encounter (Signed)
Prior auth started through new insurance plan in separate telephone encounter

## 2021-02-23 NOTE — Telephone Encounter (Signed)
Received notification of insurance change from patient.  Submitted a Prior Authorization request to [facility]  for [employer] via [employer]. Will update once we receive a response.  Key: [place] Member ID:  [id number] Group: [id number] RxBin:  [id number] RxPcn:  [id number]   Knox Saliva, PharmD, MPH, BCPS Clinical Pharmacist (Rheumatology and Pulmonology)

## 2021-02-26 ENCOUNTER — Other Ambulatory Visit: Payer: Self-pay | Admitting: *Deleted

## 2021-02-26 ENCOUNTER — Other Ambulatory Visit (HOSPITAL_COMMUNITY): Payer: Self-pay

## 2021-02-26 DIAGNOSIS — Z111 Encounter for screening for respiratory tuberculosis: Secondary | ICD-10-CM

## 2021-02-26 DIAGNOSIS — L409 Psoriasis, unspecified: Secondary | ICD-10-CM

## 2021-02-26 DIAGNOSIS — Z9225 Personal history of immunosupression therapy: Secondary | ICD-10-CM

## 2021-02-26 DIAGNOSIS — Z79899 Other long term (current) drug therapy: Secondary | ICD-10-CM

## 2021-02-26 DIAGNOSIS — L4050 Arthropathic psoriasis, unspecified: Secondary | ICD-10-CM

## 2021-02-26 MED ORDER — ENBREL SURECLICK 50 MG/ML ~~LOC~~ SOAJ: SUBCUTANEOUS | 0 refills | Status: DC

## 2021-02-26 NOTE — Telephone Encounter (Addendum)
Received notification from  [facility]  regarding a prior authorization for ENBREL. Authorization has been APPROVED from 02/24/21 to 02/24/22.   Unable to run test claim as patient is locked into specialty pharmacy  Patient must fill through [place]: [phone]  Ref # [id number]  Knox Saliva, PharmD, MPH, BCPS Clinical Pharmacist (Rheumatology and Pulmonology)

## 2021-02-26 NOTE — Telephone Encounter (Signed)
Next Visit: 03/15/2021  Last Visit: 07/17/2020  [id number] arthritis   Current Dose per office note 07/17/2020:  Enbrel 50 mg subcutaneous injections every week  Labs: 11/01/2020 CBC and CMP WNL  TB Gold: 03/01/2020 Neg  Patient updated labs today.   Patient has new pharmacy.  Okay to refill Enbrel?

## 2021-02-26 NOTE — Addendum Note (Signed)
Addended by: Ofilia Neas on: 02/26/2021 02:28 PM   Modules accepted: Orders

## 2021-02-26 NOTE — Addendum Note (Signed)
Addended by: Carole Binning on: 02/26/2021 02:20 PM   Modules accepted: Orders

## 2021-02-27 NOTE — Progress Notes (Signed)
Glucose is 112.  Rest of CMP WNL.  CBC WNL.

## 2021-03-01 LAB — CBC WITH DIFFERENTIAL/PLATELET
Absolute Monocytes: 292 cells/uL (ref 200–950)
Basophils Absolute: 39 cells/uL (ref 0–200)
Basophils Relative: 0.7 %
Eosinophils Absolute: 138 cells/uL (ref 15–500)
Eosinophils Relative: 2.5 %
HCT: 37.7 % (ref 35.0–45.0)
Hemoglobin: 12.5 g/dL (ref 11.7–15.5)
Lymphs Abs: 2338 cells/uL (ref 850–3900)
MCH: 31.9 pg (ref 27.0–33.0)
MCHC: 33.2 g/dL (ref 32.0–36.0)
MCV: 96.2 fL (ref 80.0–100.0)
MPV: 10.6 fL (ref 7.5–12.5)
Monocytes Relative: 5.3 %
Neutro Abs: 2695 cells/uL (ref 1500–7800)
Neutrophils Relative %: 49 %
Platelets: 249 10*3/uL (ref 140–400)
RBC: 3.92 10*6/uL (ref 3.80–5.10)
RDW: 12.5 % (ref 11.0–15.0)
Total Lymphocyte: 42.5 %
WBC: 5.5 10*3/uL (ref 3.8–10.8)

## 2021-03-01 LAB — QUANTIFERON-TB GOLD PLUS
Mitogen-NIL: >10 IU/mL
NIL: 0.03 IU/mL
QuantiFERON-TB Gold Plus: NEGATIVE
TB1-NIL: 0 IU/mL
TB2-NIL: <0 IU/mL

## 2021-03-01 LAB — COMPLETE METABOLIC PANEL WITH GFR
AG Ratio: 1.5 (calc) (ref 1.0–2.5)
ALT: 17 U/L (ref 6–29)
AST: 22 U/L (ref 10–35)
Albumin: 4.3 g/dL (ref 3.6–5.1)
Alkaline phosphatase (APISO): 72 U/L (ref 37–153)
BUN: 17 mg/dL (ref 7–25)
CO2: 31 mmol/L (ref 20–32)
Calcium: 10.1 mg/dL (ref 8.6–10.4)
Chloride: 103 mmol/L (ref 98–110)
Creat: 0.65 mg/dL (ref 0.50–1.05)
Globulin: 2.8 g/dL (calc) (ref 1.9–3.7)
Glucose, Bld: 112 mg/dL — ABNORMAL HIGH (ref 65–99)
Potassium: 3.7 mmol/L (ref 3.5–5.3)
Sodium: 141 mmol/L (ref 135–146)
Total Bilirubin: 0.3 mg/dL (ref 0.2–1.2)
Total Protein: 7.1 g/dL (ref 6.1–8.1)
eGFR: 98 mL/min/{1.73_m2} (ref >=60)

## 2021-03-01 NOTE — Progress Notes (Signed)
TB gold negative

## 2021-03-02 NOTE — Progress Notes (Signed)
Office Visit Note  Patient: Kimberly Butler             Date of Birth: [birth year]           MRN: [id number]             PCP: Denita Lung, MD Referring: Denita Lung, MD Visit Date: 03/15/2021 Occupation: @GUAROCC @  Subjective:  Medication management.   History of Present Illness: Kimberly Butler is a 65 y.o. female with a history of psoriatic arthritis and osteoarthritis.  She states that she has been doing well on the combination of Enbrel and methotrexate.  She denies any side effects from the medications.  She denies any psoriasis patches.  She states she has been walking and has been active.  She developed rash on her left forearm from moving boxes.  Activities of Daily Living:  Patient reports morning stiffness for 0 minutes.   Patient Denies nocturnal pain.  Difficulty dressing/grooming: Denies Difficulty climbing stairs: Denies Difficulty getting out of chair: Denies Difficulty using hands for taps, buttons, cutlery, and/or writing: Denies  Review of Systems  Constitutional:  Negative for fatigue.  HENT:  Negative for mouth sores, mouth dryness and nose dryness.   Eyes:  Negative for pain, itching and dryness.  Respiratory:  Negative for shortness of breath and difficulty breathing.   Cardiovascular:  Negative for chest pain and palpitations.  Gastrointestinal:  Negative for blood in stool, constipation and diarrhea.  Endocrine: Negative for increased urination.  Genitourinary:  Negative for difficulty urinating.  Musculoskeletal:  Negative for joint pain, joint pain, joint swelling, myalgias, morning stiffness, muscle tenderness and myalgias.  Skin:  Positive for rash. Negative for color change and sensitivity to sunlight.  Allergic/Immunologic: Negative for susceptible to infections.  Neurological:  Negative for dizziness, numbness, headaches, memory loss and weakness.  Hematological:  Negative for bruising/bleeding tendency and swollen glands.   Psychiatric/Behavioral:  Negative for confusion.    PMFS History:  Patient Active Problem List   Diagnosis Date Noted   History of COVID-19 07/31/2020   Lactose intolerance in adult 11/18/2016   Post-menopausal 08/28/2016   High risk medication use 08/28/2016   History of vitamin D deficiency 08/28/2016   History of gastroesophageal reflux (GERD) 08/28/2016   History of iron deficiency anemia 08/28/2016   Psoriasis 07/07/2014   Seizure disorder ([place]) 01/07/2011   Hypertension 01/07/2011   Psoriatic arthritis ([place]) 01/07/2011   Allergic rhinitis due to pollen 01/07/2011   Obesity (BMI 30-39.9) 01/07/2011    Past Medical History:  Diagnosis Date   Allergy    RHINITIS   Cancer ([place])    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   Epilepsy ([place])    hasn't had seizure in 20 years (01/15/17)   GERD (gastroesophageal reflux disease)    Hypertension    Obesity    Psoriasis    Psoriatic arthritis ([place])    Smoker    Varicose veins     Family History  Problem Relation Age of Onset   Cancer Mother    Heart disease Mother    Arthritis Father    Kidney failure Father    Dementia Father    Diabetes Father    Cancer Sister    Cancer Brother    Healthy Son    Past Surgical History:  Procedure Laterality Date   CATARACT EXTRACTION W/ INTRAOCULAR LENS IMPLANT Bilateral    CESAREAN SECTION     CHOLECYSTECTOMY N/A 01/23/2017   Procedure: LAPAROSCOPIC CHOLECYSTECTOMY;  Surgeon: Grandville Silos,  Lavone Neri, MD;  Location: [facility];  Service: General;  Laterality: N/A;   TONSILECTOMY, ADENOIDECTOMY, BILATERAL MYRINGOTOMY AND TUBES     TUBAL LIGATION     Social History   Social History Narrative   Not on file   Immunization History  Administered Date(s) Administered   DTaP 02/06/1994, 11/15/2004   Influenza Inj Mdck Quad Pf 03/10/2017   Influenza Whole 07/24/2009   Influenza,inj,Quad PF,6+ Mos 03/29/2016, 03/24/2018, 03/22/2019, 03/22/2020   Influenza-Unspecified 03/10/2014, 03/10/2017   PPD Test 10/20/1992    Pneumococcal Conjugate-13 12/18/2007   Pneumococcal Polysaccharide-23 03/31/2014   Tdap 03/29/2016   Zoster Recombinat (Shingrix) 12/15/2018, 03/22/2019     Objective: Vital Signs: BP 131/83 (BP Location: Left Arm, Patient Position: Sitting, Cuff Size: Normal)   Pulse (!) 53   Ht 5\' 7"  (1.702 m)   Wt 200 lb 6.4 oz (90.9 kg)   BMI 31.39 kg/m    Physical Exam Vitals and nursing note reviewed.  Constitutional:      Appearance: She is well-developed.  HENT:     Head: Normocephalic and atraumatic.  Eyes:     Conjunctiva/sclera: Conjunctivae normal.  Cardiovascular:     Rate and Rhythm: Normal rate and regular rhythm.     Heart sounds: Normal heart sounds.  Pulmonary:     Effort: Pulmonary effort is normal.     Breath sounds: Normal breath sounds.  Abdominal:     General: Bowel sounds are normal.     Palpations: Abdomen is soft.  Musculoskeletal:     Cervical back: Normal range of motion.  Lymphadenopathy:     Cervical: No cervical adenopathy.  Skin:    General: Skin is warm and dry.     Capillary Refill: Capillary refill takes less than 2 seconds.  Neurological:     Mental Status: She is alert and oriented to person, place, and time.  Psychiatric:        Behavior: Behavior normal.     Musculoskeletal Exam: C-spine was in good range of motion.  She had thoracic kyphosis.  Shoulder joints, elbow joints, wrist joints with good range of motion.  She had bilateral PIP and DIP thickening.  Hip joints and knee joints with good range of motion.  She had no tenderness over ankles or MTPs.  There was no evidence of plantar fasciitis or Achilles tendinitis.  CDAI Exam: CDAI Score: -- Patient Global: --; Provider Global: -- Swollen: --; Tender: -- Joint Exam 03/15/2021   No joint exam has been documented for this visit   There is currently no information documented on the homunculus. Go to the Rheumatology activity and complete the homunculus joint exam.  Investigation: No  additional findings.  Imaging: No results found.  Recent Labs: Lab Results  Component Value Date   WBC 5.5 02/26/2021   HGB 12.5 02/26/2021   PLT 249 02/26/2021   NA 141 02/26/2021   K 3.7 02/26/2021   CL 103 02/26/2021   CO2 31 02/26/2021   GLUCOSE 112 (H) 02/26/2021   BUN 17 02/26/2021   CREATININE 0.65 02/26/2021   BILITOT 0.3 02/26/2021   ALKPHOS 81 11/05/2019   AST 22 02/26/2021   ALT 17 02/26/2021   PROT 7.1 02/26/2021   ALBUMIN 4.1 11/05/2019   CALCIUM 10.1 02/26/2021   GFRAA 85 11/01/2020   QFTBGOLDPLUS NEGATIVE 02/26/2021    Speciality Comments: No specialty comments available.  Procedures:  No procedures performed Allergies: No known allergies   Assessment / Plan:     Visit Diagnoses: Psoriatic  arthritis (HCC)-she had no active synovitis.  She is doing well on current combination.  Psoriasis-she had no psoriasis patches.  High risk medication use - Enbrel SureClick 50 mg sq injections every 7 days, methotrexate 0.4 ml sq injections every other week-due to hair loss (taking nutrafol), and folic acid 2mg .  Labs obtained in September 2022 which included CBC with differential and CMP with GFR normal.  TB gold was negative.  Advised to reduce folic acid to 1 mg p.o. daily.  She was advised to stop Enbrel in case she develops an infection restarts once infection resolves.  Yearly skin examination to screen for nonmelanoma skin cancer was advised.  Updated information regarding immunization was also placed in the AVS.  Primary osteoarthritis of both hands-joint protection muscle strengthening was discussed.  Primary osteoarthritis of both knees-she denies any discomfort today.  Vitamin D deficiency-she is on vitamin D 1000 units daily.  Essential hypertension-blood pressure was normal today.  History of epilepsy-patient states her symptoms are stable.  History of gastroesophageal reflux (GERD)  History of iron deficiency anemia  Orders: No orders of the  defined types were placed in this encounter.  Meds ordered this encounter  Medications   folic acid (FOLVITE) 1 MG tablet    Sig: Take 1 tablet (1 mg total) by mouth daily.    Dispense:  90 tablet    Refill:  3      Follow-Up Instructions: Return in about 5 months (around 08/13/2021) for Psoriatic arthritis, Osteoarthritis.   Bo Merino, MD  Note - This record has been created using Editor, commissioning.  Chart creation errors have been sought, but may not always  have been located. Such creation errors do not reflect on  the standard of medical care.

## 2021-03-05 ENCOUNTER — Other Ambulatory Visit: Payer: Self-pay | Admitting: Physician Assistant

## 2021-03-15 ENCOUNTER — Ambulatory Visit: Payer: BC Managed Care – PPO | Admitting: Rheumatology

## 2021-03-15 ENCOUNTER — Encounter: Payer: Self-pay | Admitting: Rheumatology

## 2021-03-15 ENCOUNTER — Other Ambulatory Visit: Payer: Self-pay

## 2021-03-15 VITALS — BP 131/83 | HR 53 | Ht 67.0 in | Wt 200.4 lb

## 2021-03-15 DIAGNOSIS — L409 Psoriasis, unspecified: Secondary | ICD-10-CM | POA: Diagnosis not present

## 2021-03-15 DIAGNOSIS — M19041 Primary osteoarthritis, right hand: Secondary | ICD-10-CM

## 2021-03-15 DIAGNOSIS — Z79899 Other long term (current) drug therapy: Secondary | ICD-10-CM

## 2021-03-15 DIAGNOSIS — Z8669 Personal history of other diseases of the nervous system and sense organs: Secondary | ICD-10-CM

## 2021-03-15 DIAGNOSIS — L4050 Arthropathic psoriasis, unspecified: Secondary | ICD-10-CM | POA: Diagnosis not present

## 2021-03-15 DIAGNOSIS — M170 Bilateral primary osteoarthritis of knee: Secondary | ICD-10-CM

## 2021-03-15 DIAGNOSIS — Z862 Personal history of diseases of the blood and blood-forming organs and certain disorders involving the immune mechanism: Secondary | ICD-10-CM

## 2021-03-15 DIAGNOSIS — M19042 Primary osteoarthritis, left hand: Secondary | ICD-10-CM

## 2021-03-15 DIAGNOSIS — E559 Vitamin D deficiency, unspecified: Secondary | ICD-10-CM

## 2021-03-15 DIAGNOSIS — Z8719 Personal history of other diseases of the digestive system: Secondary | ICD-10-CM

## 2021-03-15 DIAGNOSIS — I10 Essential (primary) hypertension: Secondary | ICD-10-CM

## 2021-03-15 MED ORDER — FOLIC ACID 1 MG PO TABS: 1.0000 mg | ORAL_TABLET | Freq: Every day | ORAL | 3 refills | Status: DC

## 2021-03-15 NOTE — Patient Instructions (Addendum)
Standing Labs We placed an order today for your standing lab work.   Please have your standing labs drawn in December and every 3 months  If possible, please have your labs drawn 2 weeks prior to your appointment so that the provider can discuss your results at your appointment.  Please note that you may see your imaging and lab results in [facility] before we have reviewed them. We may be awaiting multiple results to interpret others before contacting you. Please allow our office up to 72 hours to thoroughly review all of the results before contacting the office for clarification of your results.  We have open lab daily: Monday through Thursday from 1:30-4:30 PM and Friday from 1:30-4:00 PM at the office of Dr. Bo Merino, [facility].   Please be advised, all patients with office appointments requiring lab work will take precedent over walk-in lab work.  If possible, please come for your lab work on Monday and Friday afternoons, as you may experience shorter wait times. The office is located at [address], [place], [place], [place] [address] No appointment is necessary.   Labs are drawn by [facility]. Please bring your co-pay at the time of your lab draw.  You may receive a bill from [facility] for your lab work.  If you wish to have your labs drawn at another location, please call the office 24 hours in advance to send orders.  If you have any questions regarding directions or hours of operation,  please call [phone].   As a reminder, please drink plenty of water prior to coming for your lab work. Thanks!   Vaccines You are taking a medication(s) that can suppress your immune system.  The following immunizations are recommended: Flu annually Covid-19  Td/Tdap (tetanus, diphtheria, pertussis) every 10 years Pneumonia (Prevnar 15 then Pneumovax 23 at least 1 year apart.  Alternatively, can take Prevnar 20 without needing additional dose) Shingrix: 2 doses from 4  weeks to 6 months apart  Please check with your PCP to make sure you are up to date.   If you have signs or symptoms of an infection or start antibiotics: First, call your PCP for workup of your infection. Hold your medication through the infection, until you complete your antibiotics, and until symptoms resolve if you take the following: Injectable medication (Actemra, Benlysta, Cimzia, Cosentyx, Enbrel, Humira, Kevzara, Orencia, Remicade, Simponi, Stelara, Taltz, Tremfya) Methotrexate Leflunomide (Arava) Mycophenolate (Cellcept) Roma Kayser, or Rinvoq  Please get annual skin examination by your dermatologist to screen for nonmelanoma skin cancer while you are on Enbrel.  Heart Disease Prevention   Your inflammatory disease increases your risk of heart disease which includes heart attack, stroke, atrial fibrillation (irregular heartbeats), high blood pressure, heart failure and atherosclerosis (plaque in the arteries).  It is important to reduce your risk by:   Keep blood pressure, cholesterol, and blood sugar at healthy levels   Smoking Cessation   Maintain a healthy weight  BMI 20-25   Eat a healthy diet  Plenty of fresh fruit, vegetables, and whole grains  Limit saturated fats, foods high in sodium, and added sugars  DASH and Mediterranean diet   Increase physical activity  Recommend moderate physically activity for 150 minutes per week/ 30 minutes a day for five days a week These can be broken up into three separate ten-minute sessions during the day.   Reduce Stress  Meditation, slow breathing exercises, yoga, coloring books  Dental visits twice a year

## 2021-03-28 ENCOUNTER — Telehealth: Payer: Self-pay

## 2021-03-28 NOTE — Telephone Encounter (Signed)
Patient advised she must fill through [facility], patient is locked into specialty pharmacy. Patient expressed understanding and will contact the pharmacy.

## 2021-03-28 NOTE — Telephone Encounter (Signed)
Patient left a voicemail regarding her Enbrel refill.  Patient states [facility] has tried to reach the office to get a refill, but they haven't received a response.  Patient states "I think the office made a mistake and sent the prescription to [facility]."  Patient requested a return call.

## 2021-04-16 ENCOUNTER — Other Ambulatory Visit: Payer: Self-pay | Admitting: Physician Assistant

## 2021-04-16 ENCOUNTER — Other Ambulatory Visit: Payer: Self-pay | Admitting: Family Medicine

## 2021-04-16 DIAGNOSIS — L409 Psoriasis, unspecified: Secondary | ICD-10-CM

## 2021-04-16 DIAGNOSIS — Z79899 Other long term (current) drug therapy: Secondary | ICD-10-CM

## 2021-04-16 DIAGNOSIS — L4050 Arthropathic psoriasis, unspecified: Secondary | ICD-10-CM

## 2021-04-16 DIAGNOSIS — G40909 Epilepsy, unspecified, not intractable, without status epilepticus: Secondary | ICD-10-CM

## 2021-04-16 NOTE — Telephone Encounter (Signed)
[facility] Is requesting to fill pt [facility]in. Please advise [facility]

## 2021-04-16 NOTE — Telephone Encounter (Signed)
Next Visit: 08/09/2021  Last Visit: 03/15/2021  Last Fill: 10/31/2020  DX:  Psoriatic arthritis   Current Dose per office note 03/15/2021: methotrexate 0.4 ml sq injections every other week-due to hair loss   Labs: 02/26/2021 Glucose is 112.  Rest of CMP WNL.  CBC WNL.   Okay to refill MTX?

## 2021-04-20 ENCOUNTER — Other Ambulatory Visit: Payer: Self-pay | Admitting: Physician Assistant

## 2021-04-20 NOTE — Telephone Encounter (Signed)
Next Visit: 08/09/2021  Last Visit: 03/15/2021  Last Fill: 02/26/2021  DX: Psoriatic arthritis   Current Dose per office note 84/12/2070: Enbrel SureClick 50 mg sq injections every 7 days  Labs: 02/26/2021 Glucose is 112.  Rest of CMP WNL.  CBC WNL.  TB Gold: 02/26/2021 Neg    Okay to refill Enbrel?

## 2021-04-27 ENCOUNTER — Telehealth: Payer: Self-pay

## 2021-04-27 ENCOUNTER — Other Ambulatory Visit: Payer: Self-pay

## 2021-04-27 DIAGNOSIS — I10 Essential (primary) hypertension: Secondary | ICD-10-CM

## 2021-04-27 MED ORDER — LOSARTAN POTASSIUM 50 MG PO TABS
50.0000 mg | ORAL_TABLET | Freq: Every day | ORAL | 0 refills | Status: DC
Start: 2021-04-27 — End: 2021-05-29

## 2021-04-27 MED ORDER — HYDROCHLOROTHIAZIDE 12.5 MG PO CAPS
12.5000 mg | ORAL_CAPSULE | Freq: Every day | ORAL | 0 refills | Status: DC
Start: 2021-04-27 — End: 2021-05-29

## 2021-04-27 NOTE — Telephone Encounter (Signed)
Sent in 30 days and sent pt a my chart message to call and schedule an appointment with Dr. Redmond School. [place]

## 2021-04-27 NOTE — Telephone Encounter (Signed)
[facility] a [facility]ill on the pts. Losartan and HCTZ last apt 12/21/20 virtual with Dr. Tomi Bamberger and last apt with you 09/14/19.

## 2021-05-02 ENCOUNTER — Other Ambulatory Visit: Payer: Self-pay | Admitting: Rheumatology

## 2021-05-02 NOTE — Telephone Encounter (Signed)
Next Visit: 08/09/2021   Last Visit: 03/15/2021   Last Fill: 04/19/2020  DX:  Psoriatic arthritis    Current Dose per office note 03/15/2021: Advised to reduce folic acid to 1 mg p.o. daily  Okay to refill Folic Acid?

## 2021-05-14 ENCOUNTER — Telehealth: Payer: Self-pay

## 2021-05-14 NOTE — Telephone Encounter (Signed)
Pt called & states has new insurance effective 05/10/21 and they are requiring P.A. for brand Dilantin, she has been on brand for years, said took generic years ago and it made her sick.  She will email new insurance card.

## 2021-05-17 NOTE — Telephone Encounter (Signed)
Completed P.A. with new insurance for brand Dilantin

## 2021-05-21 NOTE — Telephone Encounter (Signed)
Response back states unable to process electronically, must call plan t# [phone].  Called & they are faxing forms, need fax back to # [phone]

## 2021-05-29 ENCOUNTER — Other Ambulatory Visit: Payer: Self-pay | Admitting: Family Medicine

## 2021-05-29 DIAGNOSIS — I10 Essential (primary) hypertension: Secondary | ICD-10-CM

## 2021-06-09 NOTE — Telephone Encounter (Signed)
Appeal completed and faxed with [employer] FDA 250 form to fax# [phone]

## 2021-06-12 ENCOUNTER — Telehealth: Payer: Self-pay

## 2021-06-12 ENCOUNTER — Other Ambulatory Visit (HOSPITAL_COMMUNITY): Payer: Self-pay

## 2021-06-12 NOTE — Telephone Encounter (Signed)
Returned call to pt, had to LVM. Explained that new insurance info was not appearing under eligibility checks and that we would need the plan information in order to proceed. Requested that she send front and back images of card through MyChart if she had new card on hand. Will await f/u.

## 2021-06-12 NOTE — Telephone Encounter (Signed)
Patient called stating she has new insurance [facility] [employer].  Patient was told that a prior authorization is needed for her Enbrel medication and it needs to be sent to [facility].   Phone [phone]

## 2021-06-15 ENCOUNTER — Telehealth: Payer: Self-pay

## 2021-06-15 NOTE — Telephone Encounter (Signed)
Patient called to let [facility] know that she scanned the front and back of her insurance card on Mychart.

## 2021-06-15 NOTE — Telephone Encounter (Signed)
Noted will f/u in prior telephone encounter

## 2021-06-15 NOTE — Telephone Encounter (Signed)
Submitted an URGENT Prior Authorization request to  [facility]  for ENBREL via CoverMyMeds. Will update once we receive a response.  Key: Sofie Rower, PharmD, MPH, BCPS Clinical Pharmacist (Rheumatology and Pulmonology)

## 2021-06-20 ENCOUNTER — Other Ambulatory Visit: Payer: Self-pay | Admitting: Family Medicine

## 2021-06-20 DIAGNOSIS — I10 Essential (primary) hypertension: Secondary | ICD-10-CM

## 2021-06-22 NOTE — Telephone Encounter (Signed)
Received faxed PA form from [facility]. Completed application and faxed back as expedited. Will await response.

## 2021-06-26 NOTE — Telephone Encounter (Signed)
Patient called stating that dilantin ER Brand name is a tier 3 not covered ( 500$ a month), but 30 mg is covered.   States she has tried generic before and gave her hives.  Asks what her alternative options would be or she can wait until physical ( 07/11/21) or come in for follow up sooner.

## 2021-06-26 NOTE — Telephone Encounter (Signed)
Patient notified

## 2021-06-29 ENCOUNTER — Other Ambulatory Visit (HOSPITAL_COMMUNITY): Payer: Self-pay

## 2021-06-29 NOTE — Telephone Encounter (Addendum)
Received notification from  [facility]  regarding a prior authorization for ENBREL. Authorization has been APPROVED indefinitely per letter.  Approval letter sent to scan center.  Patient must fill with [facility] moving forward. Called patient to notify. She states that she is working with Enbrel Savings card company right now to get medication paid for. She called them this morning - due for dose on Sunday, 07/01/21.  Knox Saliva, PharmD, MPH, BCPS Clinical Pharmacist (Rheumatology and Pulmonology)

## 2021-07-03 ENCOUNTER — Ambulatory Visit: Payer: No Typology Code available for payment source | Admitting: Family Medicine

## 2021-07-11 ENCOUNTER — Other Ambulatory Visit: Payer: Self-pay

## 2021-07-11 ENCOUNTER — Ambulatory Visit (INDEPENDENT_AMBULATORY_CARE_PROVIDER_SITE_OTHER): Payer: No Typology Code available for payment source | Admitting: Family Medicine

## 2021-07-11 ENCOUNTER — Encounter: Payer: Self-pay | Admitting: Family Medicine

## 2021-07-11 VITALS — BP 112/74 | HR 58 | Temp 98.1°F | Ht 65.0 in | Wt 201.2 lb

## 2021-07-11 DIAGNOSIS — Z0000 Encounter for general adult medical examination without abnormal findings: Secondary | ICD-10-CM

## 2021-07-11 DIAGNOSIS — I10 Essential (primary) hypertension: Secondary | ICD-10-CM | POA: Diagnosis not present

## 2021-07-11 DIAGNOSIS — Z2821 Immunization not carried out because of patient refusal: Secondary | ICD-10-CM

## 2021-07-11 DIAGNOSIS — E669 Obesity, unspecified: Secondary | ICD-10-CM | POA: Diagnosis not present

## 2021-07-11 DIAGNOSIS — Z8616 Personal history of COVID-19: Secondary | ICD-10-CM

## 2021-07-11 DIAGNOSIS — Z803 Family history of malignant neoplasm of breast: Secondary | ICD-10-CM

## 2021-07-11 DIAGNOSIS — E739 Lactose intolerance, unspecified: Secondary | ICD-10-CM

## 2021-07-11 DIAGNOSIS — Z1211 Encounter for screening for malignant neoplasm of colon: Secondary | ICD-10-CM

## 2021-07-11 DIAGNOSIS — L4050 Arthropathic psoriasis, unspecified: Secondary | ICD-10-CM

## 2021-07-11 DIAGNOSIS — L409 Psoriasis, unspecified: Secondary | ICD-10-CM

## 2021-07-11 DIAGNOSIS — G40909 Epilepsy, unspecified, not intractable, without status epilepticus: Secondary | ICD-10-CM | POA: Diagnosis not present

## 2021-07-11 DIAGNOSIS — Z8719 Personal history of other diseases of the digestive system: Secondary | ICD-10-CM

## 2021-07-11 MED ORDER — LOSARTAN POTASSIUM-HCTZ 50-12.5 MG PO TABS: 1.0000 | ORAL_TABLET | Freq: Every day | ORAL | 3 refills | Status: DC

## 2021-07-11 MED ORDER — PHENYTOIN SODIUM EXTENDED 100 MG PO CAPS: 200.0000 mg | ORAL_CAPSULE | Freq: Two times a day (BID) | ORAL | 3 refills | Status: DC

## 2021-07-11 MED ORDER — FOLIC ACID 1 MG PO TABS: 1.0000 mg | ORAL_TABLET | Freq: Every day | ORAL | 3 refills | Status: DC

## 2021-07-11 NOTE — Progress Notes (Signed)
° °  Subjective:    Patient ID: Kimberly Butler, female    DOB: [birth year], 66 y.o.   MRN: [id number]  HPI She is here for complete examination.  She is now divorced.  Her husband has been involved in drugs for a long time and was in jail in the past and apparently is having the same direction again.  She seems to have a good attitude towards this.  She does have underlying psoriasis as well as psoriatic arthritis and is seeing rheumatology for that.  She seems to be doing well on her present medication.  Continues on her blood pressure medication without trouble.  He also has underlying seizure disorder and has been stable with no seizures and quite a long time.  She has had difficulty getting his medication.  I plan to reevaluate as a generic.  She is on a multivitamin.  She has not had any difficulty recently with reflux disease.  Does have a history of lactose intolerance but has made no concerns about that to me.  Otherwise her family and social history as well as health maintenance and immunizations was reviewed.   Review of Systems  All other systems reviewed and are negative.     Objective:   Physical Exam Alert and in no distress. Tympanic membranes and canals are normal. Pharyngeal area is normal. Neck is supple without adenopathy or thyromegaly. Cardiac exam shows a regular sinus rhythm without murmurs or gallops. Lungs are clear to auscultation.        Assessment & Plan:  Routine general medical examination at a health care facility - Plan: CBC with Differential/Platelet, Comprehensive metabolic panel, Lipid panel  Obesity (BMI 30-39.9) - Plan: CBC with Differential/Platelet, Comprehensive metabolic panel, Lipid panel  Psoriasis  Seizure disorder (HCC) - Plan: phenytoin (DILANTIN) 100 MG ER capsule, folic acid (FOLVITE) 1 MG tablet, CBC with Differential/Platelet, Comprehensive metabolic panel, Phenytoin level, free  Psoriatic arthritis (HCC)  Lactose intolerance in  adult  Essential hypertension - Plan: losartan-hydrochlorothiazide (HYZAAR) 50-12.5 MG tablet, CBC with Differential/Platelet, Comprehensive metabolic panel  History of COVID-19  Immunization refused  Screening for colon cancer - Plan: Cologuard  Primary hypertension  History of gastroesophageal reflux (GERD) Congratulated her on getting a divorce which is something that was sorely needed.  She will continue be followed by rheumatology.  Her medication for blood pressure with place.  Explained that I will give her a generic Dilantin monitor this closely hopefully this will take care of things and not cost her a lot of money.  She will set up for a mammogram.

## 2021-07-11 NOTE — Telephone Encounter (Signed)
Appeal was finally approved til 06/09/22, called pharmacy and #[phone] 90 day supply went thru for $154.  Called pt and she states this is affordable and wants to stay with brand.  She will call pharmacy and let them know to fill.

## 2021-07-12 ENCOUNTER — Telehealth: Payer: Self-pay

## 2021-07-12 LAB — LIPID PANEL
Chol/HDL Ratio: 2.1 ratio (ref 0.0–4.4)
Cholesterol, Total: 232 mg/dL — ABNORMAL HIGH (ref 100–199)
HDL: 109 mg/dL (ref >39)
LDL Chol Calc (NIH): 112 mg/dL — ABNORMAL HIGH (ref 0–99)
Triglycerides: 62 mg/dL (ref 0–149)
VLDL Cholesterol Cal: 11 mg/dL (ref 5–40)

## 2021-07-12 LAB — COMPREHENSIVE METABOLIC PANEL
ALT: 16 IU/L (ref 0–32)
AST: 21 IU/L (ref 0–40)
Albumin/Globulin Ratio: 1.7 (ref 1.2–2.2)
Albumin: 4.6 g/dL (ref 3.8–4.8)
Alkaline Phosphatase: 93 IU/L (ref 44–121)
BUN/Creatinine Ratio: 29 — ABNORMAL HIGH (ref 12–28)
BUN: 20 mg/dL (ref 8–27)
Bilirubin Total: 0.2 mg/dL (ref 0.0–1.2)
CO2: 25 mmol/L (ref 20–29)
Calcium: 9.7 mg/dL (ref 8.7–10.3)
Chloride: 104 mmol/L (ref 96–106)
Creatinine, Ser: 0.7 mg/dL (ref 0.57–1.00)
Globulin, Total: 2.7 g/dL (ref 1.5–4.5)
Glucose: 94 mg/dL (ref 70–99)
Potassium: 4.2 mmol/L (ref 3.5–5.2)
Sodium: 142 mmol/L (ref 134–144)
Total Protein: 7.3 g/dL (ref 6.0–8.5)
eGFR: 96 mL/min/{1.73_m2} (ref >59)

## 2021-07-12 LAB — CBC WITH DIFFERENTIAL/PLATELET
Basophils Absolute: 0 10*3/uL (ref 0.0–0.2)
Basos: 1 %
EOS (ABSOLUTE): 0.2 10*3/uL (ref 0.0–0.4)
Eos: 2 %
Hematocrit: 37.4 % (ref 34.0–46.6)
Hemoglobin: 12.8 g/dL (ref 11.1–15.9)
Immature Grans (Abs): 0 10*3/uL (ref 0.0–0.1)
Immature Granulocytes: 0 %
Lymphocytes Absolute: 2.1 10*3/uL (ref 0.7–3.1)
Lymphs: 32 %
MCH: 31.5 pg (ref 26.6–33.0)
MCHC: 34.2 g/dL (ref 31.5–35.7)
MCV: 92 fL (ref 79–97)
Monocytes Absolute: 0.6 10*3/uL (ref 0.1–0.9)
Monocytes: 9 %
Neutrophils Absolute: 3.8 10*3/uL (ref 1.4–7.0)
Neutrophils: 56 %
Platelets: 239 10*3/uL (ref 150–450)
RBC: 4.06 x10E6/uL (ref 3.77–5.28)
RDW: 12.4 % (ref 11.7–15.4)
WBC: 6.7 10*3/uL (ref 3.4–10.8)

## 2021-07-12 LAB — PHENYTOIN LEVEL, FREE: Phenytoin, Free: NOT DETECTED ug/mL (ref 1.0–2.0)

## 2021-07-12 NOTE — Telephone Encounter (Signed)
P.A. LOSARTAN HCTZ completed

## 2021-07-12 NOTE — Telephone Encounter (Signed)
Pt states losartan HCTZ requiring P.A.

## 2021-07-17 ENCOUNTER — Other Ambulatory Visit: Payer: Self-pay | Admitting: Family Medicine

## 2021-07-17 DIAGNOSIS — I10 Essential (primary) hypertension: Secondary | ICD-10-CM

## 2021-07-17 NOTE — Telephone Encounter (Signed)
Recv'd response that Losartan covered without P.A.  Called pharmacy and pt just picked up 90 days for $35. They were running with [employer] and just got it straightened out.  Called pt and informed.

## 2021-07-31 ENCOUNTER — Other Ambulatory Visit: Payer: Self-pay | Admitting: Physician Assistant

## 2021-07-31 NOTE — Telephone Encounter (Signed)
Next Visit: 08/09/2021   Last Visit: 03/15/2021   Last Fill: 04/20/2021  DX:  Psoriatic arthritis    Current Dose per office note 88/12/5795: Enbrel SureClick 50 mg sq injections every 7 days  Labs: 07/11/2021 BUN/Creatinine Ratio 29  TB Gold: 02/26/2021 Neg   Okay to refill Enbrel?

## 2021-08-07 NOTE — Progress Notes (Signed)
Office Visit Note  Patient: Kimberly Butler             Date of Birth: Jun 29, 1955           MRN: [id number]             PCP: Denita Lung, MD Referring: Denita Lung, MD Visit Date: 08/09/2021 Occupation: @GUAROCC @  Subjective:  Medication monitoring  History of Present Illness: Kimberly Butler is a 66 y.o. female with history of psoriatic arthritis and osteoarthritis.  She is on Enbrel 50 mg subcutaneous injections once weekly and methotrexate 0.3 mL sq injections once weekly.  She is tolerating these medications without any side effects and has not missed any doses recently.  She denies any signs or symptoms of a psoriatic arthritis flare.  She denies any joint pain or joint swelling at this time.  She has not had any difficulty with ADLs.  She denies any Achilles tendinitis or plantar fasciitis.  She has a few scattered patches of psoriasis on her upper extremities. She denies any recent infections. She has been trying to increase her exercise regimen and has been walking 3 miles per day.      Activities of Daily Living:  Patient reports morning stiffness for 0 minutes.   Patient Denies nocturnal pain.  Difficulty dressing/grooming: Denies Difficulty climbing stairs: Denies Difficulty getting out of chair: Denies Difficulty using hands for taps, buttons, cutlery, and/or writing: Denies  Review of Systems  Constitutional:  Negative for fatigue.  HENT:  Negative for mouth sores, mouth dryness and nose dryness.   Eyes:  Negative for pain, itching and dryness.  Respiratory:  Negative for shortness of breath and difficulty breathing.   Cardiovascular:  Negative for chest pain and palpitations.  Gastrointestinal:  Negative for blood in stool, constipation and diarrhea.  Endocrine: Negative for increased urination.  Genitourinary:  Negative for difficulty urinating.  Musculoskeletal:  Positive for joint pain, joint pain and joint swelling. Negative for myalgias, morning  stiffness, muscle tenderness and myalgias.  Skin:  Negative for color change, rash and redness.  Allergic/Immunologic: Negative for susceptible to infections.  Neurological:  Negative for dizziness, numbness, headaches, memory loss and weakness.  Hematological:  Negative for bruising/bleeding tendency.  Psychiatric/Behavioral:  Negative for confusion.    PMFS History:  Patient Active Problem List   Diagnosis Date Noted   Family history of breast cancer 07/11/2021   History of COVID-19 07/31/2020   Lactose intolerance in adult 11/18/2016   Post-menopausal 08/28/2016   High risk medication use 08/28/2016   History of vitamin D deficiency 08/28/2016   History of gastroesophageal reflux (GERD) 08/28/2016   History of iron deficiency anemia 08/28/2016   Psoriasis 07/07/2014   Seizure disorder ([place]) 01/07/2011   Hypertension 01/07/2011   Psoriatic arthritis ([place]) 01/07/2011   Allergic rhinitis due to pollen 01/07/2011   Obesity (BMI 30-39.9) 01/07/2011    Past Medical History:  Diagnosis Date   Allergy    RHINITIS   Cancer ([place])    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   Epilepsy ([place])    hasn't had seizure in 20 years (01/15/17)   GERD (gastroesophageal reflux disease)    Hypertension    Obesity    Psoriasis    Psoriatic arthritis ([place])    Smoker    Varicose veins     Family History  Problem Relation Age of Onset   Cancer Mother    Heart disease Mother    Arthritis Father    Kidney  failure Father    Dementia Father    Diabetes Father    Cancer Sister    Cancer Brother    Healthy Son    Past Surgical History:  Procedure Laterality Date   CATARACT EXTRACTION W/ INTRAOCULAR LENS IMPLANT Bilateral    CESAREAN SECTION     CHOLECYSTECTOMY N/A 01/23/2017   Procedure: LAPAROSCOPIC CHOLECYSTECTOMY;  Surgeon: Georganna Skeans, MD;  Location: [facility];  Service: General;  Laterality: N/A;   TONSILECTOMY, ADENOIDECTOMY, BILATERAL MYRINGOTOMY AND TUBES     TUBAL LIGATION     Social History    Social History Narrative   Not on file   Immunization History  Administered Date(s) Administered   DTaP 02/06/1994, 11/15/2004   Influenza Inj Mdck Quad Pf 03/10/2017   Influenza Whole 07/24/2009   Influenza,inj,Quad PF,6+ Mos 03/29/2016, 03/24/2018, 03/22/2019, 03/22/2020   Influenza-Unspecified 03/10/2014, 03/10/2017   PPD Test 10/20/1992   Pneumococcal Conjugate-13 12/18/2007   Pneumococcal Polysaccharide-23 03/31/2014   Tdap 03/29/2016   Zoster Recombinat (Shingrix) 12/15/2018, 03/22/2019     Objective: Vital Signs: BP (!) 177/75 (BP Location: Left Arm, Patient Position: Sitting, Cuff Size: Normal)    Pulse (!) 57    Ht 5\' 7"  (1.702 m)    Wt 207 lb 9.6 oz (94.2 kg)    BMI 32.51 kg/m    Physical Exam Vitals and nursing note reviewed.  Constitutional:      Appearance: She is well-developed.  HENT:     Head: Normocephalic and atraumatic.  Eyes:     Conjunctiva/sclera: Conjunctivae normal.  Cardiovascular:     Rate and Rhythm: Normal rate and regular rhythm.     Heart sounds: Normal heart sounds.  Pulmonary:     Effort: Pulmonary effort is normal.     Breath sounds: Normal breath sounds.  Abdominal:     General: Bowel sounds are normal.     Palpations: Abdomen is soft.  Musculoskeletal:     Cervical back: Normal range of motion.  Skin:    General: Skin is warm and dry.     Capillary Refill: Capillary refill takes less than 2 seconds.  Neurological:     Mental Status: She is alert and oriented to person, place, and time.  Psychiatric:        Behavior: Behavior normal.     Musculoskeletal Exam: C-spine, thoracic spine, lumbar spine have good range of motion with no discomfort.  No midline spinal tenderness.  Shoulder joints, elbow joints, wrist joints, MCPs, PIPs, DIPs have good range of motion with no synovitis.  PIP and DIP thickening consistent with osteoarthritis of both hands.  Hip joints have good range of motion with no groin pain.  Knee joints have good  range of motion with no warmth or effusion.  Ankle joints have good range of motion with no joint tenderness.  Pedal edema noted bilaterally.  No calf tightness or tenderness noted.  No tenderness over the trochanteric bursa or IT band's.  Some tenderness along the right hamstrings.   CDAI Exam: CDAI Score: -- Patient Global: --; Provider Global: -- Swollen: --; Tender: -- Joint Exam 08/09/2021   No joint exam has been documented for this visit   There is currently no information documented on the homunculus. Go to the Rheumatology activity and complete the homunculus joint exam.  Investigation: No additional findings.  Imaging: No results found.  Recent Labs: Lab Results  Component Value Date   WBC 6.7 07/11/2021   HGB 12.8 07/11/2021   PLT 239 07/11/2021  NA 142 07/11/2021   K 4.2 07/11/2021   CL 104 07/11/2021   CO2 25 07/11/2021   GLUCOSE 94 07/11/2021   BUN 20 07/11/2021   CREATININE 0.70 07/11/2021   BILITOT 0.2 07/11/2021   ALKPHOS 93 07/11/2021   AST 21 07/11/2021   ALT 16 07/11/2021   PROT 7.3 07/11/2021   ALBUMIN 4.6 07/11/2021   CALCIUM 9.7 07/11/2021   GFRAA 85 11/01/2020   QFTBGOLDPLUS NEGATIVE 02/26/2021    Speciality Comments: No specialty comments available.  Procedures:  No procedures performed Allergies: Metoclopramide and Promethazine   Assessment / Plan:     Visit Diagnoses: Psoriatic arthritis ([place]): She has no synovitis or dactylitis on examination.  She has not had any signs or symptoms of a psoriatic arthritis flare.  She is clinically doing well on Enbrel 50 mg subcutaneous injections once weekly and methotrexate 0.3 mL sq injections once weekly.  She is tolerating these medications without any side effects and has not missed any doses recently.  She has not had any recent infections.  She has not been experiencing any joint pain, joint swelling, morning stiffness, nocturnal discomfort, or difficulty with ADLs.  She has no evidence of  Achilles tendinitis or plantar fasciitis.  No SI joint tenderness upon palpation.  She has been trying to increase her exercise regimen and has been walking 3 miles daily.  Discussed the importance of resistive exercises.  She will remain on the current treatment regimen.  She was advised to notify us if she develops any new or worsening symptoms.  She will follow-up in the office in 5 months.  Psoriasis: She has a few scattered patches of psoriasis on her upper extremities and 1 on her face.  She requested a refill of desonide to be sent to the pharmacy.  High risk medication use - Enbrel SureClick 50 mg sq injections every 7 days, methotrexate 0.3 ml sq injections every other week-due to hair loss (taking nutrafol), and folic acid 2mg .   CBC and CMP drawn on 07/11/2021.  Her next lab work will be due in May and every 3 months to monitor for drug toxicity.  TB Gold negative on 02/26/2021 and will continue to be monitored yearly. She has not had any recent infections.  Discussed the importance of holding Enbrel if she develops signs or symptoms of an infection and to resume once the infection is completely cleared.  Primary osteoarthritis of both hands: She has PIP and DIP thickening consistent with osteoarthritis of both hands.  No tenderness or inflammation was noted on examination.  She was able to make a complete fist bilaterally.  Discussed the importance of joint protection and muscle strengthening.  Primary osteoarthritis of both knees: She has good range of motion of both knee joints on examination today.  No warmth or effusion was noted.  Discussed the importance of lower extremity muscle strengthening.  She has been walking 3 miles daily for exercise.  Other medical conditions are listed as follows:  Vitamin D deficiency  History of gastroesophageal reflux (GERD)  History of epilepsy  History of iron deficiency anemia  Essential hypertension  Orders: No orders of the defined types  were placed in this encounter.  Meds ordered this encounter  Medications   desonide (DESOWEN) 0.05 % cream    Sig: Apply topically 2 (two) times daily.    Dispense:  60 g    Refill:  0      Follow-Up Instructions: Return in about 5 months (around 01/09/2022)  for Psoriatic arthritis, Osteoarthritis.   Ofilia Neas, PA-C  Note - This record has been created using Dragon software.  Chart creation errors have been sought, but may not always  have been located. Such creation errors do not reflect on  the standard of medical care.

## 2021-08-09 ENCOUNTER — Ambulatory Visit (INDEPENDENT_AMBULATORY_CARE_PROVIDER_SITE_OTHER): Payer: No Typology Code available for payment source | Admitting: Physician Assistant

## 2021-08-09 ENCOUNTER — Encounter: Payer: Self-pay | Admitting: Physician Assistant

## 2021-08-09 ENCOUNTER — Other Ambulatory Visit: Payer: Self-pay

## 2021-08-09 VITALS — BP 177/75 | HR 57 | Ht 67.0 in | Wt 207.6 lb

## 2021-08-09 DIAGNOSIS — M19041 Primary osteoarthritis, right hand: Secondary | ICD-10-CM

## 2021-08-09 DIAGNOSIS — Z8719 Personal history of other diseases of the digestive system: Secondary | ICD-10-CM

## 2021-08-09 DIAGNOSIS — Z862 Personal history of diseases of the blood and blood-forming organs and certain disorders involving the immune mechanism: Secondary | ICD-10-CM

## 2021-08-09 DIAGNOSIS — L409 Psoriasis, unspecified: Secondary | ICD-10-CM | POA: Diagnosis not present

## 2021-08-09 DIAGNOSIS — L4050 Arthropathic psoriasis, unspecified: Secondary | ICD-10-CM

## 2021-08-09 DIAGNOSIS — I10 Essential (primary) hypertension: Secondary | ICD-10-CM

## 2021-08-09 DIAGNOSIS — Z79899 Other long term (current) drug therapy: Secondary | ICD-10-CM | POA: Diagnosis not present

## 2021-08-09 DIAGNOSIS — M170 Bilateral primary osteoarthritis of knee: Secondary | ICD-10-CM

## 2021-08-09 DIAGNOSIS — Z8669 Personal history of other diseases of the nervous system and sense organs: Secondary | ICD-10-CM

## 2021-08-09 DIAGNOSIS — E559 Vitamin D deficiency, unspecified: Secondary | ICD-10-CM

## 2021-08-09 DIAGNOSIS — M19042 Primary osteoarthritis, left hand: Secondary | ICD-10-CM

## 2021-08-09 MED ORDER — DESONIDE 0.05 % EX CREA: TOPICAL_CREAM | Freq: Two times a day (BID) | CUTANEOUS | 0 refills | Status: DC

## 2021-08-09 NOTE — Patient Instructions (Signed)
Standing Labs °We placed an order today for your standing lab work.  ° °Please have your standing labs drawn in May and every 3 months  ° ° °If possible, please have your labs drawn 2 weeks prior to your appointment so that the provider can discuss your results at your appointment. ° °Please note that you may see your imaging and lab results in MyChart before we have reviewed them. °We may be awaiting multiple results to interpret others before contacting you. °Please allow our office up to 72 hours to thoroughly review all of the results before contacting the office for clarification of your results. ° °We have open lab daily: °Monday through Thursday from 1:30-4:30 PM and Friday from 1:30-4:00 PM °at the office of Dr. Shaili Deveshwar, [facility].   °Please be advised, all patients with office appointments requiring lab work will take precedent over walk-in lab work.  °If possible, please come for your lab work on Monday and Friday afternoons, as you may experience shorter wait times. °The office is located at [address], [address], [place], [place] [address] °No appointment is necessary.   °Labs are drawn by [facility]. Please bring your co-pay at the time of your lab draw.  You may receive a bill from [facility] for your lab work. ° °Please note if you are on Hydroxychloroquine and and an order has been placed for a Hydroxychloroquine level, you will need to have it drawn 4 hours or more after your last dose. ° °If you wish to have your labs drawn at another location, please call the office 24 hours in advance to send orders. ° °If you have any questions regarding directions or hours of operation,  °please call [phone].   °As a reminder, please drink plenty of water prior to coming for your lab work. Thanks! ° °

## 2021-08-11 ENCOUNTER — Telehealth: Payer: Self-pay

## 2021-08-11 NOTE — Telephone Encounter (Signed)
Recv'd another  P.A. BRAND DILANTIN will call plan

## 2021-08-16 ENCOUNTER — Telehealth: Payer: Self-pay | Admitting: Rheumatology

## 2021-08-16 NOTE — Telephone Encounter (Signed)
Returned call to patient. Patient states she would like to know if she can use the Desonide on her vaginal area. Please advise.  ?

## 2021-08-16 NOTE — Telephone Encounter (Signed)
Patient advised Desonide is considered a low potency topical corticosteroid but is typically not recommended for the use in the genital area.  Patient advised to reach out to her dermatologist to see which cream she has used in the past. Patient states she was seeing Dr. Ubaldo Glassing and she has retired. The name of the cream she has used in the past is Mometasone Furoate 0.1% Apply to the affected area as needed. Patient states this has always worked well for her.  ?

## 2021-08-16 NOTE — Telephone Encounter (Signed)
Patient advised Desonide is lower potency than mometasone.   ?All topical corticosteroids are for external use only.   ?  ?Patient advised Lovena Le would recommend following up with a new dermatologist for further evaluation and management ?

## 2021-08-16 NOTE — Telephone Encounter (Signed)
Desonide is considered a low potency topical corticosteroid but is typically not recommended for the use in the genital area.  Please advise the patient to reach out to her dermatologist to see which cream she has used in the past.

## 2021-08-16 NOTE — Telephone Encounter (Signed)
Desonide is lower potency than mometasone.   ?All topical corticosteroids are for external use only.   ? ?I would recommend following up with a new dermatologist for further evaluation and management.

## 2021-08-16 NOTE — Telephone Encounter (Signed)
Patient called the office stating she was prescribed a cream (Desonide 0.05%) by Lovena Le at her last appointment. Patient states she wants to make sure it can be used on her "private areas". Patient did not specify where. Please advise. ?

## 2021-10-28 ENCOUNTER — Other Ambulatory Visit: Payer: Self-pay | Admitting: Physician Assistant

## 2021-10-28 DIAGNOSIS — L4050 Arthropathic psoriasis, unspecified: Secondary | ICD-10-CM

## 2021-10-28 DIAGNOSIS — L409 Psoriasis, unspecified: Secondary | ICD-10-CM

## 2021-10-28 DIAGNOSIS — Z79899 Other long term (current) drug therapy: Secondary | ICD-10-CM

## 2021-10-29 NOTE — Telephone Encounter (Signed)
Next Visit: 01/10/2022  Last Visit: 08/09/2021  Last Fill: 04/16/2021  DX: Psoriatic arthritis   Current Dose per office note 08/09/2021: methotrexate 0.3 mL sq injections once weekly.    Labs: 07/11/2021 BUN/Creatinine Ratio 29  Left message to advise patient she is due to update labs.  Okay to refill MTX?

## 2021-11-02 ENCOUNTER — Other Ambulatory Visit: Payer: Self-pay | Admitting: *Deleted

## 2021-11-02 DIAGNOSIS — Z9225 Personal history of immunosupression therapy: Secondary | ICD-10-CM

## 2021-11-02 DIAGNOSIS — Z79899 Other long term (current) drug therapy: Secondary | ICD-10-CM

## 2021-11-02 DIAGNOSIS — Z111 Encounter for screening for respiratory tuberculosis: Secondary | ICD-10-CM

## 2021-11-03 LAB — CBC WITH DIFFERENTIAL/PLATELET
Absolute Monocytes: 386 cells/uL (ref 200–950)
Basophils Absolute: 50 cells/uL (ref 0–200)
Basophils Relative: 0.9 %
Eosinophils Absolute: 118 cells/uL (ref 15–500)
Eosinophils Relative: 2.1 %
HCT: 34.6 % — ABNORMAL LOW (ref 35.0–45.0)
Hemoglobin: 11.9 g/dL (ref 11.7–15.5)
Lymphs Abs: 2274 cells/uL (ref 850–3900)
MCH: 32.3 pg (ref 27.0–33.0)
MCHC: 34.4 g/dL (ref 32.0–36.0)
MCV: 94 fL (ref 80.0–100.0)
MPV: 10 fL (ref 7.5–12.5)
Monocytes Relative: 6.9 %
Neutro Abs: 2772 cells/uL (ref 1500–7800)
Neutrophils Relative %: 49.5 %
Platelets: 240 10*3/uL (ref 140–400)
RBC: 3.68 10*6/uL — ABNORMAL LOW (ref 3.80–5.10)
RDW: 12.8 % (ref 11.0–15.0)
Total Lymphocyte: 40.6 %
WBC: 5.6 10*3/uL (ref 3.8–10.8)

## 2021-11-03 LAB — COMPLETE METABOLIC PANEL WITH GFR
AG Ratio: 1.4 (calc) (ref 1.0–2.5)
ALT: 16 U/L (ref 6–29)
AST: 21 U/L (ref 10–35)
Albumin: 4 g/dL (ref 3.6–5.1)
Alkaline phosphatase (APISO): 73 U/L (ref 37–153)
BUN: 17 mg/dL (ref 7–25)
CO2: 30 mmol/L (ref 20–32)
Calcium: 9.6 mg/dL (ref 8.6–10.4)
Chloride: 103 mmol/L (ref 98–110)
Creat: 0.69 mg/dL (ref 0.50–1.05)
Globulin: 2.9 g/dL (calc) (ref 1.9–3.7)
Glucose, Bld: 91 mg/dL (ref 65–99)
Potassium: 3.7 mmol/L (ref 3.5–5.3)
Sodium: 141 mmol/L (ref 135–146)
Total Bilirubin: 0.3 mg/dL (ref 0.2–1.2)
Total Protein: 6.9 g/dL (ref 6.1–8.1)
eGFR: 96 mL/min/{1.73_m2} (ref >=60)

## 2021-11-03 NOTE — Progress Notes (Signed)
CBC and CMP are normal.

## 2021-11-26 ENCOUNTER — Other Ambulatory Visit: Payer: Self-pay | Admitting: Physician Assistant

## 2021-11-26 DIAGNOSIS — L409 Psoriasis, unspecified: Secondary | ICD-10-CM

## 2021-11-26 DIAGNOSIS — L4050 Arthropathic psoriasis, unspecified: Secondary | ICD-10-CM

## 2021-11-26 DIAGNOSIS — Z79899 Other long term (current) drug therapy: Secondary | ICD-10-CM

## 2021-11-26 NOTE — Telephone Encounter (Signed)
Next Visit: 01/10/2022  Last Visit: 08/09/2021  Last Fill: 10/29/2021  DX:  Psoriatic arthritis   Current Dose per office note 08/09/2021: methotrexate 0.3 ml sq injections every other week-  Labs: 11/02/2021, CBC and CMP are normal.  Okay to refill MTX?

## 2021-12-03 ENCOUNTER — Telehealth: Payer: Self-pay | Admitting: Family Medicine

## 2021-12-27 NOTE — Progress Notes (Signed)
Office Visit Note  Patient: Kimberly Butler             Date of Birth: [birth year]           MRN: [id number]             PCP: Denita Lung, MD Referring: Denita Lung, MD Visit Date: 01/10/2022 Occupation: '@GUAROCC'$ @  Subjective:  Medication management  History of Present Illness: Kimberly Butler is a 66 y.o. female with history of psoriatic arthritis and osteoarthritis.  She states she has been taking Enbrel 50 mg subcu weekly without any side effects.  She denies any joint pain or joint swelling.  She has few psoriasis patches here and there.  She recently went to [place] for a mission trip.  She states she developed some discomfort in her right hip and knee which resolved by itself.  She denies any history of plantar fasciitis, Achilles tendinitis or uveitis.  She has been going to a dermatologist once a year for skin examination.  Activities of Daily Living:  Patient reports morning stiffness for 0 minutes.   Patient Denies nocturnal pain.  Difficulty dressing/grooming: Denies Difficulty climbing stairs: Denies Difficulty getting out of chair: Denies Difficulty using hands for taps, buttons, cutlery, and/or writing: Denies  Review of Systems  Constitutional:  Positive for fatigue.  HENT:  Negative for mouth sores and mouth dryness.   Eyes:  Negative for dryness.  Respiratory:  Negative for shortness of breath.   Cardiovascular:  Negative for chest pain and palpitations.  Gastrointestinal:  Negative for blood in stool, constipation and diarrhea.  Endocrine: Negative for increased urination.  Genitourinary:  Negative for involuntary urination.  Musculoskeletal:  Negative for joint pain, joint pain, joint swelling, myalgias, muscle weakness, morning stiffness, muscle tenderness and myalgias.  Skin:  Negative for color change, rash, hair loss and sensitivity to sunlight.  Allergic/Immunologic: Negative for susceptible to infections.  Neurological:  Negative for dizziness and  headaches.  Hematological:  Negative for swollen glands.  Psychiatric/Behavioral:  Negative for depressed mood and sleep disturbance. The patient is not nervous/anxious.     PMFS History:  Patient Active Problem List   Diagnosis Date Noted   Family history of breast cancer 07/11/2021   History of COVID-19 07/31/2020   Lactose intolerance in adult 11/18/2016   Post-menopausal 08/28/2016   High risk medication use 08/28/2016   History of vitamin D deficiency 08/28/2016   History of gastroesophageal reflux (GERD) 08/28/2016   History of iron deficiency anemia 08/28/2016   Psoriasis 07/07/2014   Seizure disorder ([place]) 01/07/2011   Hypertension 01/07/2011   Psoriatic arthritis ([place]) 01/07/2011   Allergic rhinitis due to pollen 01/07/2011   Obesity (BMI 30-39.9) 01/07/2011    Past Medical History:  Diagnosis Date   Allergy    RHINITIS   Cancer ([place])    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   Epilepsy ([place])    hasn't had seizure in 20 years (01/15/17)   GERD (gastroesophageal reflux disease)    Hypertension    Obesity    Psoriasis    Psoriatic arthritis ([place])    Smoker    Varicose veins     Family History  Problem Relation Age of Onset   Cancer Mother    Heart disease Mother    Arthritis Father    Kidney failure Father    Dementia Father    Diabetes Father    Cancer Sister    Cancer Brother    Healthy Son  Past Surgical History:  Procedure Laterality Date   CATARACT EXTRACTION W/ INTRAOCULAR LENS IMPLANT Bilateral    CESAREAN SECTION     CHOLECYSTECTOMY N/A 01/23/2017   Procedure: LAPAROSCOPIC CHOLECYSTECTOMY;  Surgeon: Georganna Skeans, MD;  Location: [facility];  Service: General;  Laterality: N/A;   TONSILECTOMY, ADENOIDECTOMY, BILATERAL MYRINGOTOMY AND TUBES     TUBAL LIGATION     Social History   Social History Narrative   Not on file   Immunization History  Administered Date(s) Administered   DTaP 02/06/1994, 11/15/2004   Influenza Inj Mdck Quad Pf 03/10/2017   Influenza  Whole 07/24/2009   Influenza,inj,Quad PF,6+ Mos 03/29/2016, 03/24/2018, 03/22/2019, 03/22/2020   Influenza-Unspecified 03/10/2014, 03/10/2017   PPD Test 10/20/1992   Pneumococcal Conjugate-13 12/18/2007   Pneumococcal Polysaccharide-23 03/31/2014   Tdap 03/29/2016   Zoster Recombinat (Shingrix) 12/15/2018, 03/22/2019     Objective: Vital Signs: BP 100/65 (BP Location: Left Arm, Patient Position: Sitting, Cuff Size: Normal)   Pulse 60   Resp 13   Ht '5\' 6"'$  (1.676 m)   Wt 202 lb 3.2 oz (91.7 kg)   BMI 32.64 kg/m    Physical Exam Vitals and nursing note reviewed.  Constitutional:      Appearance: She is well-developed.  HENT:     Head: Normocephalic and atraumatic.  Eyes:     Conjunctiva/sclera: Conjunctivae normal.  Cardiovascular:     Rate and Rhythm: Normal rate and regular rhythm.     Heart sounds: Normal heart sounds.  Pulmonary:     Effort: Pulmonary effort is normal.     Breath sounds: Normal breath sounds.  Abdominal:     General: Bowel sounds are normal.     Palpations: Abdomen is soft.  Musculoskeletal:     Cervical back: Normal range of motion.  Lymphadenopathy:     Cervical: No cervical adenopathy.  Skin:    General: Skin is warm and dry.     Capillary Refill: Capillary refill takes less than 2 seconds.  Neurological:     Mental Status: She is alert and oriented to person, place, and time.  Psychiatric:        Behavior: Behavior normal.      Musculoskeletal Exam: C-spine thoracic and lumbar spine were in good range of motion.  She had no SI joint tenderness.  Shoulder joints, elbow joints, wrist joints, MCPs PIPs and DIPs with good range of motion with no synovitis.  She had bilateral PIP and DIP thickening.  Hip joints and knee joints with good range of motion.  She had no tenderness on ankles or MTPs.  CDAI Exam: CDAI Score: -- Patient Global: --; Provider Global: -- Swollen: --; Tender: -- Joint Exam 01/10/2022   No joint exam has been documented  for this visit   There is currently no information documented on the homunculus. Go to the Rheumatology activity and complete the homunculus joint exam.  Investigation: No additional findings.  Imaging: No results found.  Recent Labs: Lab Results  Component Value Date   WBC 5.6 11/02/2021   HGB 11.9 11/02/2021   PLT 240 11/02/2021   NA 141 11/02/2021   K 3.7 11/02/2021   CL 103 11/02/2021   CO2 30 11/02/2021   GLUCOSE 91 11/02/2021   BUN 17 11/02/2021   CREATININE 0.69 11/02/2021   BILITOT 0.3 11/02/2021   ALKPHOS 93 07/11/2021   AST 21 11/02/2021   ALT 16 11/02/2021   PROT 6.9 11/02/2021   ALBUMIN 4.6 07/11/2021   CALCIUM 9.6 11/02/2021  GFRAA 85 11/01/2020   QFTBGOLDPLUS NEGATIVE 02/26/2021    Speciality Comments: No specialty comments available.  Procedures:  No procedures performed Allergies: Metoclopramide and Promethazine   Assessment / Plan:     Visit Diagnoses: Psoriatic arthritis ([facility])-she had no synovitis on examination.  She denies any history of joint swelling.  She has been taking Enbrel and methotrexate on a weekly basis without any side effects.  I offered repeating x-rays to monitor for disease progression.  Patient would prefer to do it some other time.  She denies history of [place] fasciitis, Achilles tendinitis and uveitis.  Psoriasis-she has few psoriasis lesions on her forearm.  She states these patches come and go.  She does not want to use any topical agents at this point.  High risk medication use - Enbrel SureClick 50 mg sq injections every 7 days, methotrexate 0.3 ml sq injections every other week-due to hair loss (taking nutrafol), and folic acid '2mg'$ .   -Nov 02, 2021 CBC and CMP with GFR were normal.  TB gold was negative on September 26, 2020.  She recently went to [place] and preferred to have TB Gold done.  Will obtain following labs today.  Plan: CBC with Differential/Platelet, COMPLETE METABOLIC PANEL WITH GFR, QuantiFERON-TB Gold Plus.   Information regarding immunization was placed in the AVS.  She was also advised to hold methotrexate and Enbrel if she develops an infection and resume after the infection resolves.  Annual skin examination to screen for skin cancer was advised.  Primary osteoarthritis of both hands-she has osteoarthritis in her bilateral hands.  No synovitis was noted.  Joint protection was discussed.  Primary osteoarthritis of both knees-she has intermittent pain in her knee joints.  No synovitis was noted today.  Normal extremity muscle strengthening exercises were discussed.  Vitamin D deficiency -her last vitamin D was 35.2 on August 27, 2017.  She states she stopped taking vitamin D.  We will check vitamin D level again today.  Plan: VITAMIN D 25 Hydroxy (Vit-D Deficiency, Fractures)  Essential hypertension-blood pressure was normal today.  History of gastroesophageal reflux (GERD)  History of epilepsy  History of iron deficiency anemia  Orders: Orders Placed This Encounter  Procedures   CBC with Differential/Platelet   COMPLETE METABOLIC PANEL WITH GFR   QuantiFERON-TB Gold Plus   VITAMIN D 25 Hydroxy (Vit-D Deficiency, Fractures)   No orders of the defined types were placed in this encounter.    Follow-Up Instructions: Return in about 5 months (around 06/12/2022) for Psoriatic arthritis, Osteoarthritis.   Bo Merino, MD  Note - This record has been created using Editor, commissioning.  Chart creation errors have been sought, but may not always  have been located. Such creation errors do not reflect on  the standard of medical care.

## 2022-01-03 LAB — HM MAMMOGRAPHY

## 2022-01-08 ENCOUNTER — Other Ambulatory Visit: Payer: Self-pay | Admitting: Physician Assistant

## 2022-01-08 NOTE — Telephone Encounter (Signed)
Next Visit: 01/10/2022  Last Visit: 08/09/2021  Last Fill: 07/31/2021  YB:TVDFPBH of psoriatic arthritis and osteoarthritis  Current Dose per office note Enbrel 50 mg : subcutaneous injections once weekly  Labs: 11/02/2021 Abnormal RBC 3.68 Low HCT 34.6 Low  TB Gold: 02/26/2021  Okay to refill Enbrel?

## 2022-01-10 ENCOUNTER — Encounter: Payer: Self-pay | Admitting: Rheumatology

## 2022-01-10 ENCOUNTER — Ambulatory Visit: Payer: No Typology Code available for payment source | Attending: Rheumatology | Admitting: Rheumatology

## 2022-01-10 VITALS — BP 100/65 | HR 60 | Resp 13 | Ht 66.0 in | Wt 202.2 lb

## 2022-01-10 DIAGNOSIS — M19042 Primary osteoarthritis, left hand: Secondary | ICD-10-CM

## 2022-01-10 DIAGNOSIS — Z8719 Personal history of other diseases of the digestive system: Secondary | ICD-10-CM

## 2022-01-10 DIAGNOSIS — M19041 Primary osteoarthritis, right hand: Secondary | ICD-10-CM

## 2022-01-10 DIAGNOSIS — L409 Psoriasis, unspecified: Secondary | ICD-10-CM

## 2022-01-10 DIAGNOSIS — I10 Essential (primary) hypertension: Secondary | ICD-10-CM

## 2022-01-10 DIAGNOSIS — Z8669 Personal history of other diseases of the nervous system and sense organs: Secondary | ICD-10-CM

## 2022-01-10 DIAGNOSIS — Z79899 Other long term (current) drug therapy: Secondary | ICD-10-CM

## 2022-01-10 DIAGNOSIS — M170 Bilateral primary osteoarthritis of knee: Secondary | ICD-10-CM

## 2022-01-10 DIAGNOSIS — E559 Vitamin D deficiency, unspecified: Secondary | ICD-10-CM

## 2022-01-10 DIAGNOSIS — L4050 Arthropathic psoriasis, unspecified: Secondary | ICD-10-CM | POA: Diagnosis not present

## 2022-01-10 DIAGNOSIS — Z862 Personal history of diseases of the blood and blood-forming organs and certain disorders involving the immune mechanism: Secondary | ICD-10-CM

## 2022-01-10 NOTE — Patient Instructions (Signed)
Standing Labs We placed an order today for your standing lab work.   Please have your standing labs drawn in November and every 3 months  If possible, please have your labs drawn 2 weeks prior to your appointment so that the provider can discuss your results at your appointment.  Please note that you may see your imaging and lab results in MyChart before we have reviewed them. We may be awaiting multiple results to interpret others before contacting you. Please allow our office up to 72 hours to thoroughly review all of the results before contacting the office for clarification of your results.  We have open lab daily: Monday through Thursday from 1:30-4:30 PM and Friday from 1:30-4:00 PM at the office of Dr. Khylee Algeo, [facility].   Please be advised, all patients with office appointments requiring lab work will take precedent over walk-in lab work.  If possible, please come for your lab work on Monday and Friday afternoons, as you may experience shorter wait times. The office is located at [address], [address], [place], [place] [address] No appointment is necessary.   Labs are drawn by [facility]. Please bring your co-pay at the time of your lab draw.  You may receive a bill from [facility] for your lab work.  Please note if you are on Hydroxychloroquine and and an order has been placed for a Hydroxychloroquine level, you will need to have it drawn 4 hours or more after your last dose.  If you wish to have your labs drawn at another location, please call the office 24 hours in advance to send orders.  If you have any questions regarding directions or hours of operation,  please call [phone].   As a reminder, please drink plenty of water prior to coming for your lab work. Thanks!   Vaccines You are taking a medication(s) that can suppress your immune system.  The following immunizations are recommended: Flu annually Covid-19  Td/Tdap (tetanus, diphtheria,  pertussis) every 10 years Pneumonia (Prevnar 15 then Pneumovax 23 at least 1 year apart.  Alternatively, can take Prevnar 20 without needing additional dose) Shingrix: 2 doses from 4 weeks to 6 months apart  Please check with your PCP to make sure you are up to date.   If you have signs or symptoms of an infection or start antibiotics: First, call your PCP for workup of your infection. Hold your medication through the infection, until you complete your antibiotics, and until symptoms resolve if you take the following: Injectable medication (Actemra, Benlysta, Cimzia, Cosentyx, Enbrel, Humira, Kevzara, Orencia, Remicade, Simponi, Stelara, Taltz, Tremfya) Methotrexate Leflunomide (Arava) Mycophenolate (Cellcept) Xeljanz, Olumiant, or Rinvoq  

## 2022-01-11 ENCOUNTER — Other Ambulatory Visit: Payer: Self-pay | Admitting: *Deleted

## 2022-01-11 DIAGNOSIS — E559 Vitamin D deficiency, unspecified: Secondary | ICD-10-CM

## 2022-01-11 MED ORDER — VITAMIN D (ERGOCALCIFEROL) 1.25 MG (50000 UNIT) PO CAPS: 50000.0000 [IU] | ORAL_CAPSULE | ORAL | 0 refills | Status: DC

## 2022-01-11 NOTE — Telephone Encounter (Signed)
-----   Message from Bo Merino, MD sent at 01/11/2022  8:06 AM EDT ----- CBC and CMP normal.  Vitamin D is low at 25.  Please call in vitamin D 50,000 units once a week for 3 months.  She should continue baseline vitamin D after finishing the course of vitamin D.  Repeat vitamin D in 3 months.

## 2022-01-11 NOTE — Progress Notes (Signed)
CBC and CMP normal.  Vitamin D is low at 25.  Please call in vitamin D 50,000 units once a week for 3 months.  She should continue baseline vitamin D after finishing the course of vitamin D.  Repeat vitamin D in 3 months.

## 2022-01-13 LAB — COMPLETE METABOLIC PANEL WITH GFR
AG Ratio: 1.4 (calc) (ref 1.0–2.5)
ALT: 15 U/L (ref 6–29)
AST: 21 U/L (ref 10–35)
Albumin: 4.2 g/dL (ref 3.6–5.1)
Alkaline phosphatase (APISO): 73 U/L (ref 37–153)
BUN: 21 mg/dL (ref 7–25)
CO2: 28 mmol/L (ref 20–32)
Calcium: 9.9 mg/dL (ref 8.6–10.4)
Chloride: 105 mmol/L (ref 98–110)
Creat: 0.77 mg/dL (ref 0.50–1.05)
Globulin: 2.9 g/dL (calc) (ref 1.9–3.7)
Glucose, Bld: 88 mg/dL (ref 65–99)
Potassium: 4.4 mmol/L (ref 3.5–5.3)
Sodium: 141 mmol/L (ref 135–146)
Total Bilirubin: 0.4 mg/dL (ref 0.2–1.2)
Total Protein: 7.1 g/dL (ref 6.1–8.1)
eGFR: 85 mL/min/{1.73_m2} (ref >=60)

## 2022-01-13 LAB — CBC WITH DIFFERENTIAL/PLATELET
Absolute Monocytes: 518 cells/uL (ref 200–950)
Basophils Absolute: 51 cells/uL (ref 0–200)
Basophils Relative: 0.8 %
Eosinophils Absolute: 160 cells/uL (ref 15–500)
Eosinophils Relative: 2.5 %
HCT: 37.1 % (ref 35.0–45.0)
Hemoglobin: 12.6 g/dL (ref 11.7–15.5)
Lymphs Abs: 1914 cells/uL (ref 850–3900)
MCH: 32.7 pg (ref 27.0–33.0)
MCHC: 34 g/dL (ref 32.0–36.0)
MCV: 96.4 fL (ref 80.0–100.0)
MPV: 10 fL (ref 7.5–12.5)
Monocytes Relative: 8.1 %
Neutro Abs: 3757 cells/uL (ref 1500–7800)
Neutrophils Relative %: 58.7 %
Platelets: 235 10*3/uL (ref 140–400)
RBC: 3.85 10*6/uL (ref 3.80–5.10)
RDW: 12.7 % (ref 11.0–15.0)
Total Lymphocyte: 29.9 %
WBC: 6.4 10*3/uL (ref 3.8–10.8)

## 2022-01-13 LAB — QUANTIFERON-TB GOLD PLUS
Mitogen-NIL: >10 IU/mL
NIL: 0.07 IU/mL
QuantiFERON-TB Gold Plus: NEGATIVE
TB1-NIL: 0.04 IU/mL
TB2-NIL: 0.04 IU/mL

## 2022-01-13 LAB — VITAMIN D 25 HYDROXY (VIT D DEFICIENCY, FRACTURES): Vit D, 25-Hydroxy: 25 ng/mL — ABNORMAL LOW (ref 30–100)

## 2022-01-14 LAB — COLOGUARD: COLOGUARD: NEGATIVE

## 2022-01-14 NOTE — Progress Notes (Signed)
TB Gold is negative.

## 2022-03-19 ENCOUNTER — Encounter: Payer: Self-pay | Admitting: Internal Medicine

## 2022-03-19 ENCOUNTER — Other Ambulatory Visit: Payer: Self-pay | Admitting: Physician Assistant

## 2022-03-19 ENCOUNTER — Other Ambulatory Visit: Payer: Self-pay | Admitting: Family Medicine

## 2022-03-19 DIAGNOSIS — L409 Psoriasis, unspecified: Secondary | ICD-10-CM

## 2022-03-19 DIAGNOSIS — L4050 Arthropathic psoriasis, unspecified: Secondary | ICD-10-CM

## 2022-03-19 DIAGNOSIS — Z79899 Other long term (current) drug therapy: Secondary | ICD-10-CM

## 2022-03-19 DIAGNOSIS — I10 Essential (primary) hypertension: Secondary | ICD-10-CM

## 2022-03-19 NOTE — Telephone Encounter (Signed)
Next Visit: 06/13/2021  Last Visit: 01/10/2022  Last Fill: 11/26/2021  DX: Psoriatic arthritis   Current Dose per office note 01/10/2022: methotrexate 0.3 ml sq injections every other week  Labs: 01/10/2022 CBC and CMP normal.  Vitamin D is low at 25  Okay to refill methotrexate?

## 2022-03-28 ENCOUNTER — Other Ambulatory Visit: Payer: Self-pay | Admitting: Rheumatology

## 2022-03-28 DIAGNOSIS — E559 Vitamin D deficiency, unspecified: Secondary | ICD-10-CM

## 2022-04-01 ENCOUNTER — Encounter: Payer: Self-pay | Admitting: Internal Medicine

## 2022-04-25 ENCOUNTER — Other Ambulatory Visit: Payer: Self-pay | Admitting: Family Medicine

## 2022-04-25 DIAGNOSIS — I10 Essential (primary) hypertension: Secondary | ICD-10-CM

## 2022-05-30 NOTE — Progress Notes (Unsigned)
Office Visit Note  Patient: Kimberly Butler             Date of Birth: 04/01/1956           MRN: [id number]             PCP: Denita Lung, MD Referring: Denita Lung, MD Visit Date: 06/13/2022 Occupation: '@GUAROCC'$ @  Subjective:  Medication monitoring   History of Present Illness: Kimberly Butler is a 66 y.o. female with history of psoriatic arthritis and osteoarthritis.  She remains on Enbrel SureClick 50 mg sq injections every 7 days, methotrexate 0.3 ml sq injections every other week-due to hair loss (taking nutrafol), and folic acid '2mg'$ .  She is tolerating combination therapy without any side effects and has not missed any doses recently.  She has not had any recent or recurrent infections.  She experiences some stiffness first thing in the morning while walking lasting about 5 minutes daily.  She denies any joint swelling at this time.  She continues to have some small scattered patches of psoriasis for which she uses a daily moisturizer.  She has not needed to use desonide recently.  She denies any Achilles tendinitis or plantar fasciitis.  She denies any SI joint discomfort at this time.    She continues to walk for exercise on a regular basis.  She denies any new medical conditions.   Activities of Daily Living:  Patient reports morning stiffness for 5 minutes  Patient Denies nocturnal pain.  Difficulty dressing/grooming: Denies Difficulty climbing stairs: Denies Difficulty getting out of chair: Denies Difficulty using hands for taps, buttons, cutlery, and/or writing: Denies  Review of Systems  Constitutional:  Negative for fatigue.  HENT:  Negative for mouth sores and mouth dryness.   Eyes:  Negative for pain, redness and dryness.  Respiratory:  Negative for cough, hemoptysis and shortness of breath.   Cardiovascular:  Negative for chest pain and palpitations.  Gastrointestinal:  Negative for blood in stool, constipation and diarrhea.  Endocrine: Negative for increased  urination.  Genitourinary:  Negative for involuntary urination.  Musculoskeletal:  Positive for morning stiffness. Negative for joint pain, gait problem, joint pain, joint swelling, myalgias, muscle weakness, muscle tenderness and myalgias.  Skin:  Positive for rash. Negative for color change, hair loss and sensitivity to sunlight.  Allergic/Immunologic: Negative for susceptible to infections.  Neurological:  Negative for dizziness and headaches.  Hematological:  Negative for swollen glands.  Psychiatric/Behavioral:  Negative for depressed mood and sleep disturbance. The patient is not nervous/anxious.     PMFS History:  Patient Active Problem List   Diagnosis Date Noted   Family history of breast cancer 07/11/2021   History of COVID-19 07/31/2020   Lactose intolerance in adult 11/18/2016   Post-menopausal 08/28/2016   High risk medication use 08/28/2016   History of vitamin D deficiency 08/28/2016   History of gastroesophageal reflux (GERD) 08/28/2016   History of iron deficiency anemia 08/28/2016   Psoriasis 07/07/2014   Seizure disorder ([place]) 01/07/2011   Hypertension 01/07/2011   Psoriatic arthritis ([place]) 01/07/2011   Allergic rhinitis due to pollen 01/07/2011   Obesity (BMI 30-39.9) 01/07/2011    Past Medical History:  Diagnosis Date   Allergy    RHINITIS   Cancer ([place])    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   Epilepsy ([place])    hasn't had seizure in 20 years (01/15/17)   GERD (gastroesophageal reflux disease)    Hypertension    Obesity    Psoriasis  Psoriatic arthritis ([place])    Smoker    Varicose veins     Family History  Problem Relation Age of Onset   Cancer Mother    Heart disease Mother    Arthritis Father    Kidney failure Father    Dementia Father    Diabetes Father    Cancer Sister    Cancer Brother    Healthy Son    Past Surgical History:  Procedure Laterality Date   CATARACT EXTRACTION W/ INTRAOCULAR LENS IMPLANT Bilateral    CESAREAN SECTION      CHOLECYSTECTOMY N/A 01/23/2017   Procedure: LAPAROSCOPIC CHOLECYSTECTOMY;  Surgeon: Georganna Skeans, MD;  Location: [facility];  Service: General;  Laterality: N/A;   TONSILECTOMY, ADENOIDECTOMY, BILATERAL MYRINGOTOMY AND TUBES     TUBAL LIGATION     Social History   Social History Narrative   Not on file   Immunization History  Administered Date(s) Administered   DTaP 02/06/1994, 11/15/2004   Fluad Quad(high Dose 65+) 05/23/2022   Influenza Inj Mdck Quad Pf 03/10/2017   Influenza Whole 07/24/2009   Influenza,inj,Quad PF,6+ Mos 03/29/2016, 03/24/2018, 03/22/2019, 03/22/2020   Influenza-Unspecified 03/10/2014, 03/10/2017   PPD Test 10/20/1992   Pneumococcal Conjugate-13 12/18/2007   Pneumococcal Polysaccharide-23 03/31/2014   Tdap 03/29/2016   Zoster Recombinat (Shingrix) 12/15/2018, 03/22/2019     Objective: Vital Signs: BP 127/82 (BP Location: Left Arm, Patient Position: Sitting, Cuff Size: Normal)   Pulse (!) 54   Resp 15   Ht '5\' 6"'$  (1.676 m)   Wt 207 lb 6.4 oz (94.1 kg)   BMI 33.48 kg/m    Physical Exam Vitals and nursing note reviewed.  Constitutional:      Appearance: She is well-developed.  HENT:     Head: Normocephalic and atraumatic.  Eyes:     Conjunctiva/sclera: Conjunctivae normal.  Cardiovascular:     Rate and Rhythm: Normal rate and regular rhythm.     Heart sounds: Normal heart sounds.  Pulmonary:     Effort: Pulmonary effort is normal.     Breath sounds: Normal breath sounds.  Abdominal:     General: Bowel sounds are normal.     Palpations: Abdomen is soft.  Musculoskeletal:     Cervical back: Normal range of motion.  Skin:    General: Skin is warm and dry.     Capillary Refill: Capillary refill takes less than 2 seconds.  Neurological:     Mental Status: She is alert and oriented to person, place, and time.  Psychiatric:        Behavior: Behavior normal.      Musculoskeletal Exam: C-spine, thoracic spine, lumbar spine have good range of motion.   No midline spinal tenderness or SI joint tenderness.  Shoulder joints, elbow joints, wrist joints, MCPs, PIPs, DIPs have good range of motion with no synovitis.  PIP and DIP thickening consistent with osteoarthritis of both hands.  Hip joints have good range of motion with no groin pain.  No tenderness over trochanteric bursa bilaterally.  Knee joints have good range of motion with no warmth or effusion.  Ankle joints have good range of motion with no tenderness or synovitis.  CDAI Exam: CDAI Score: -- Patient Global: --; Provider Global: -- Swollen: --; Tender: -- Joint Exam 06/13/2022   No joint exam has been documented for this visit   There is currently no information documented on the homunculus. Go to the Rheumatology activity and complete the homunculus joint exam.  Investigation: No additional findings.  Imaging: No results found.  Recent Labs: Lab Results  Component Value Date   WBC 6.4 01/10/2022   HGB 12.6 01/10/2022   PLT 235 01/10/2022   NA 141 01/10/2022   K 4.4 01/10/2022   CL 105 01/10/2022   CO2 28 01/10/2022   GLUCOSE 88 01/10/2022   BUN 21 01/10/2022   CREATININE 0.77 01/10/2022   BILITOT 0.4 01/10/2022   ALKPHOS 93 07/11/2021   AST 21 01/10/2022   ALT 15 01/10/2022   PROT 7.1 01/10/2022   ALBUMIN 4.6 07/11/2021   CALCIUM 9.9 01/10/2022   GFRAA 85 11/01/2020   QFTBGOLDPLUS NEGATIVE 01/10/2022    Speciality Comments: No specialty comments available.  Procedures:  No procedures performed Allergies: Metoclopramide and Promethazine   Assessment / Plan:     Visit Diagnoses: Psoriatic arthritis ([place]): She has no synovitis or dactylitis on examination today.  She has not had any signs or symptoms of a psoriatic arthritis flare.  She has clinically been doing well on Enbrel 50 mg subcutaneous injections once weekly and methotrexate 0.3 mL sq injections once weekly.  She is tolerating combination therapy without any side effects or injection site reactions.   She has morning stiffness lasting about 5 minutes daily.  She has not had any nocturnal pain or difficulty with ADLs.  No evidence of Achilles tendinitis or plantar fasciitis.  No SI joint tenderness upon palpation.  She has a few small patches of scattered psoriasis on her upper arms for which she uses a daily moisturizer.  She has not needed to use desonide cream topically recently.  She will remain on methotrexate and Enbrel as combination therapy.  She was advised to notify us if she develops signs or symptoms of a flare.  She will follow-up in the office in 5 months or sooner if needed.  Psoriasis: She has small scattered patches of psoriasis on her upper extremities for which she uses a daily moisturizer.  She has a prescription for desonide cream but has not needed to use it recently.  She will remain on Enbrel and methotrexate as combination therapy.  She was advised to notify us if she develops any new or worsening of psoriasis patches.  High risk medication use - Enbrel SureClick 50 mg sq injections every 7 days, methotrexate 0.3 ml sq injections every other week-due to hair loss (taking nutrafol), and folic acid '2mg'$  - Plan: COMPLETE METABOLIC PANEL WITH GFR, CBC with Differential/Platelet CBC and CMP drawn on 01/10/22. Orders for CBC and CMP released.  Her next lab work will be due in April and every 3 months to monitor for drug toxicity.  Standing orders for CBC and CMP remain in place.  TB gold negative on 01/10/22.  No recent or recurrent infections.  Discussed the importance of holding Enbrel and MTX if she develops signs or symptoms of an infection and to resume once the infection has completely cleared.  Discussed the importance of yearly skin cancer screening.  Primary osteoarthritis of both hands: She has PIP and DIP thickening consistent with osteoarthritis of both hands.  No tenderness or synovitis noted.  Complete fist formation bilaterally.  Primary osteoarthritis of both knees: She  has good range of motion of both knee joints on examination today.  No warmth or effusion noted.  She has been walking for exercise on a regular basis.  Vitamin D deficiency: She is taking vitamin D 5000 units daily.  Other medical conditions are listed as follows:  Essential hypertension: Blood pressure was  127/82 today in the office.  History of gastroesophageal reflux (GERD)  History of epilepsy  History of iron deficiency anemia  Orders: Orders Placed This Encounter  Procedures   COMPLETE METABOLIC PANEL WITH GFR   CBC with Differential/Platelet   No orders of the defined types were placed in this encounter.     Follow-Up Instructions: Return in about 5 months (around 11/12/2022) for Psoriatic arthritis, Osteoarthritis.   Ofilia Neas, PA-C  Note - This record has been created using Dragon software.  Chart creation errors have been sought, but may not always  have been located. Such creation errors do not reflect on  the standard of medical care.

## 2022-06-12 ENCOUNTER — Other Ambulatory Visit: Payer: Self-pay | Admitting: Physician Assistant

## 2022-06-13 ENCOUNTER — Ambulatory Visit: Payer: No Typology Code available for payment source | Attending: Physician Assistant | Admitting: Physician Assistant

## 2022-06-13 ENCOUNTER — Encounter: Payer: Self-pay | Admitting: Physician Assistant

## 2022-06-13 VITALS — BP 127/82 | HR 54 | Resp 15 | Ht 66.0 in | Wt 207.4 lb

## 2022-06-13 DIAGNOSIS — Z8669 Personal history of other diseases of the nervous system and sense organs: Secondary | ICD-10-CM

## 2022-06-13 DIAGNOSIS — Z8719 Personal history of other diseases of the digestive system: Secondary | ICD-10-CM

## 2022-06-13 DIAGNOSIS — E559 Vitamin D deficiency, unspecified: Secondary | ICD-10-CM

## 2022-06-13 DIAGNOSIS — L4050 Arthropathic psoriasis, unspecified: Secondary | ICD-10-CM | POA: Diagnosis not present

## 2022-06-13 DIAGNOSIS — Z79899 Other long term (current) drug therapy: Secondary | ICD-10-CM

## 2022-06-13 DIAGNOSIS — M19042 Primary osteoarthritis, left hand: Secondary | ICD-10-CM

## 2022-06-13 DIAGNOSIS — Z862 Personal history of diseases of the blood and blood-forming organs and certain disorders involving the immune mechanism: Secondary | ICD-10-CM

## 2022-06-13 DIAGNOSIS — L409 Psoriasis, unspecified: Secondary | ICD-10-CM | POA: Diagnosis not present

## 2022-06-13 DIAGNOSIS — M19041 Primary osteoarthritis, right hand: Secondary | ICD-10-CM | POA: Diagnosis not present

## 2022-06-13 DIAGNOSIS — I10 Essential (primary) hypertension: Secondary | ICD-10-CM

## 2022-06-13 DIAGNOSIS — M170 Bilateral primary osteoarthritis of knee: Secondary | ICD-10-CM

## 2022-06-13 NOTE — Patient Instructions (Signed)
Standing Labs We placed an order today for your standing lab work.   Please have your standing labs drawn in April and every 3 months   Please have your labs drawn 2 weeks prior to your appointment so that the provider can discuss your lab results at your appointment.  Please note that you may see your imaging and lab results in MyChart before we have reviewed them. We will contact you once all results are reviewed. Please allow our office up to 72 hours to thoroughly review all of the results before contacting the office for clarification of your results.  Lab hours are:   Monday through Thursday from 8:00 am -12:30 pm and 1:00 pm-5:00 pm and Friday from 8:00 am-12:00 pm.  Please be advised, all patients with office appointments requiring lab work will take precedent over walk-in lab work.   Labs are drawn by [facility]. Please bring your co-pay at the time of your lab draw.  You may receive a bill from [facility] for your lab work.  Please note if you are on Hydroxychloroquine and and an order has been placed for a Hydroxychloroquine level, you will need to have it drawn 4 hours or more after your last dose.  If you wish to have your labs drawn at another location, please call the office 24 hours in advance so we can fax the orders.  The office is located at [address], [address], [place], [place] [address] No appointment is necessary.    If you have any questions regarding directions or hours of operation,  please call [phone].   As a reminder, please drink plenty of water prior to coming for your lab work. Thanks!  

## 2022-06-14 LAB — COMPLETE METABOLIC PANEL WITH GFR
AG Ratio: 1.3 (calc) (ref 1.0–2.5)
ALT: 18 U/L (ref 6–29)
AST: 21 U/L (ref 10–35)
Albumin: 4.1 g/dL (ref 3.6–5.1)
Alkaline phosphatase (APISO): 75 U/L (ref 37–153)
BUN: 22 mg/dL (ref 7–25)
CO2: 29 mmol/L (ref 20–32)
Calcium: 9.7 mg/dL (ref 8.6–10.4)
Chloride: 104 mmol/L (ref 98–110)
Creat: 0.65 mg/dL (ref 0.50–1.05)
Globulin: 3.2 g/dL (calc) (ref 1.9–3.7)
Glucose, Bld: 86 mg/dL (ref 65–99)
Potassium: 4.3 mmol/L (ref 3.5–5.3)
Sodium: 140 mmol/L (ref 135–146)
Total Bilirubin: 0.3 mg/dL (ref 0.2–1.2)
Total Protein: 7.3 g/dL (ref 6.1–8.1)
eGFR: 97 mL/min/{1.73_m2} (ref >=60)

## 2022-06-14 LAB — CBC WITH DIFFERENTIAL/PLATELET
Absolute Monocytes: 551 cells/uL (ref 200–950)
Basophils Absolute: 17 cells/uL (ref 0–200)
Basophils Relative: 0.3 %
Eosinophils Absolute: 128 cells/uL (ref 15–500)
Eosinophils Relative: 2.2 %
HCT: 36 % (ref 35.0–45.0)
Hemoglobin: 12.2 g/dL (ref 11.7–15.5)
Lymphs Abs: 2233 cells/uL (ref 850–3900)
MCH: 31.6 pg (ref 27.0–33.0)
MCHC: 33.9 g/dL (ref 32.0–36.0)
MCV: 93.3 fL (ref 80.0–100.0)
MPV: 9.8 fL (ref 7.5–12.5)
Monocytes Relative: 9.5 %
Neutro Abs: 2871 cells/uL (ref 1500–7800)
Neutrophils Relative %: 49.5 %
Platelets: 241 10*3/uL (ref 140–400)
RBC: 3.86 10*6/uL (ref 3.80–5.10)
RDW: 12.4 % (ref 11.0–15.0)
Total Lymphocyte: 38.5 %
WBC: 5.8 10*3/uL (ref 3.8–10.8)

## 2022-06-14 NOTE — Progress Notes (Signed)
CBC and CMP WNL

## 2022-07-09 ENCOUNTER — Encounter: Payer: Self-pay | Admitting: Family Medicine

## 2022-07-09 ENCOUNTER — Ambulatory Visit (INDEPENDENT_AMBULATORY_CARE_PROVIDER_SITE_OTHER): Payer: No Typology Code available for payment source | Admitting: Family Medicine

## 2022-07-09 VITALS — BP 110/70 | HR 56 | Temp 97.8°F | Resp 14 | Wt 208.0 lb

## 2022-07-09 DIAGNOSIS — Z635 Disruption of family by separation and divorce: Secondary | ICD-10-CM

## 2022-07-09 DIAGNOSIS — H9313 Tinnitus, bilateral: Secondary | ICD-10-CM | POA: Diagnosis not present

## 2022-07-09 NOTE — Progress Notes (Signed)
   Subjective:    Patient ID: Kimberly Butler, female    DOB: 1956/03/05, 67 y.o.   MRN: [id number]  HPI She states that approximately 1 month ago she noted the onset of ringing in both ears.  She denies decreased hearing and says her right ear is causing some slight discomfort but not at the present time.  She is on no new medications.  She does not take aspirin on a regular basis.  No fever, chills, nasal congestion. She is now divorced and is in a legal battle with her husband who is now in federal prison for the next 14 years.  Review of Systems     Objective:   Physical Exam Alert and in no distress. Tympanic membranes and canals are normal.  She can hear the spoken voice at a low level quite easily.  Pharyngeal area is normal. Neck is supple without adenopathy or thyromegaly. Cardiac exam shows a regular sinus rhythm without murmurs or gallops. Lungs are clear to auscultation.        Assessment & Plan:  Tinnitus of both ears - Plan: Ambulatory referral to ENT  Marital problem involving divorce Discussed the diagnosis of tinnitus and the fact that there is usually not a reason behind it.  Discussed referral to ENT versus to the [facility] tinnitus clinic.  Will start with the ENT first, she was comfortable with that.

## 2022-07-15 ENCOUNTER — Other Ambulatory Visit: Payer: Self-pay | Admitting: Family Medicine

## 2022-07-15 DIAGNOSIS — G40909 Epilepsy, unspecified, not intractable, without status epilepticus: Secondary | ICD-10-CM

## 2022-07-15 DIAGNOSIS — I10 Essential (primary) hypertension: Secondary | ICD-10-CM

## 2022-07-16 ENCOUNTER — Other Ambulatory Visit: Payer: Self-pay | Admitting: Family Medicine

## 2022-07-16 DIAGNOSIS — G40909 Epilepsy, unspecified, not intractable, without status epilepticus: Secondary | ICD-10-CM

## 2022-07-18 ENCOUNTER — Encounter: Payer: Self-pay | Admitting: Family Medicine

## 2022-07-18 ENCOUNTER — Ambulatory Visit (INDEPENDENT_AMBULATORY_CARE_PROVIDER_SITE_OTHER): Payer: No Typology Code available for payment source | Admitting: Family Medicine

## 2022-07-18 VITALS — BP 122/74 | HR 50 | Temp 97.4°F | Resp 14 | Ht 64.5 in | Wt 208.4 lb

## 2022-07-18 DIAGNOSIS — J301 Allergic rhinitis due to pollen: Secondary | ICD-10-CM | POA: Diagnosis not present

## 2022-07-18 DIAGNOSIS — Z0000 Encounter for general adult medical examination without abnormal findings: Secondary | ICD-10-CM

## 2022-07-18 DIAGNOSIS — Z13220 Encounter for screening for lipoid disorders: Secondary | ICD-10-CM

## 2022-07-18 DIAGNOSIS — Z8719 Personal history of other diseases of the digestive system: Secondary | ICD-10-CM

## 2022-07-18 DIAGNOSIS — Z780 Asymptomatic menopausal state: Secondary | ICD-10-CM

## 2022-07-18 DIAGNOSIS — G40909 Epilepsy, unspecified, not intractable, without status epilepticus: Secondary | ICD-10-CM

## 2022-07-18 DIAGNOSIS — Z23 Encounter for immunization: Secondary | ICD-10-CM

## 2022-07-18 DIAGNOSIS — I10 Essential (primary) hypertension: Secondary | ICD-10-CM | POA: Diagnosis not present

## 2022-07-18 DIAGNOSIS — Z8639 Personal history of other endocrine, nutritional and metabolic disease: Secondary | ICD-10-CM

## 2022-07-18 DIAGNOSIS — E669 Obesity, unspecified: Secondary | ICD-10-CM

## 2022-07-18 DIAGNOSIS — L4050 Arthropathic psoriasis, unspecified: Secondary | ICD-10-CM | POA: Diagnosis not present

## 2022-07-18 DIAGNOSIS — Z8616 Personal history of COVID-19: Secondary | ICD-10-CM

## 2022-07-18 LAB — POCT URINALYSIS DIP (PROADVANTAGE DEVICE)
Bilirubin, UA: NEGATIVE
Blood, UA: NEGATIVE
Glucose, UA: NEGATIVE mg/dL
Ketones, POC UA: NEGATIVE mg/dL
Nitrite, UA: NEGATIVE
Protein Ur, POC: NEGATIVE mg/dL
Specific Gravity, Urine: 1.01
Urobilinogen, Ur: 0.2
pH, UA: 6.5 (ref 5.0–8.0)

## 2022-07-18 LAB — LIPID PANEL

## 2022-07-18 MED ORDER — LOSARTAN POTASSIUM-HCTZ 50-12.5 MG PO TABS: 1.0000 | ORAL_TABLET | Freq: Every day | ORAL | 3 refills | Status: DC

## 2022-07-18 MED ORDER — PHENYTOIN SODIUM EXTENDED 100 MG PO CAPS: ORAL_CAPSULE | ORAL | 3 refills | Status: DC

## 2022-07-18 NOTE — Progress Notes (Signed)
Complete physical exam  Patient: Kimberly Butler   DOB: 1955/08/25   67 y.o. Female  MRN: [id number]  Subjective:    Chief Complaint  Patient presents with   Annual Exam    Fasting. No additional concerns.     Kimberly Butler is a 67 y.o. female who presents today for a complete physical exam. She reports consuming a general diet. Home exercise routine includes walking 3 miles daily. She generally feels well. She reports sleeping fairly well.  Pain is well as Enbrel.  This is going well for her.  She also is taking her blood pressure meds without problem.  She continues on Dilantin for his seizure disorder has not had seizures in quite some times.  She uses Tums for her reflux symptoms.  Her allergies seem to be under good control with OTC medications.  She is in the process of filing for divorce.  Her husband is now on federal prison.  She does continue on vitamin D supplementation does have a previous history of vitamin D deficiency.  She plans to get a mammogram in July.  Sees rheumatology regularly and continues on She does not have additional problems to discuss today.  She continues to work.  Otherwise family and social history as well as health maintenance immunizations was reviewed.   Most recent fall risk assessment:    07/09/2022    1:26 PM  [place] in the past year? 0  Number falls in past yr: 0  Injury with Fall? 0  Risk for fall due to : No Fall Risks  Follow up Falls evaluation completed     Most recent depression screenings:    07/18/2022    9:27 AM 07/11/2021    8:48 AM  PHQ 2/9 Scores  PHQ - 2 Score 0 0    Vision:Within last year and Dental: Receives regular dental care    Patient Care Team: Denita Lung, MD as PCP - General (Family Medicine)   Outpatient Medications Prior to Visit  Medication Sig   calcium carbonate (TUMS - DOSED IN MG ELEMENTAL CALCIUM) 500 MG chewable tablet Chew 1-2 tablets by mouth as needed for indigestion or heartburn  (depends on indigestion if takes 1-2 tablets).   cholecalciferol (VITAMIN D) 1000 units tablet Take 5,000 Units by mouth daily.   desonide (DESOWEN) 0.05 % cream Apply topically 2 (two) times daily. (Patient taking differently: Apply topically as needed.)   etanercept (ENBREL SURECLICK) 50 MG/ML injection INJECT 1 PEN ('50MG'$ ) UNDER THE SKIN EVERY 7 DAYS   folic acid (FOLVITE) 1 MG tablet TAKE 1 TABLET BY MOUTH EVERY DAY   Methotrexate Sodium (METHOTREXATE, PF,) 50 MG/2ML injection INJECT 0.3 MLS INTO THE SKIN EVERY OTHER WEEK *SINGLE USE VIALS*   Multiple Vitamins-Minerals (MULTIVITAMIN WITH MINERALS) tablet Take 1 tablet by mouth daily.   Turmeric 500 MG CAPS Take 500 mg by mouth every other day.   [DISCONTINUED] losartan-hydrochlorothiazide (HYZAAR) 50-12.5 MG tablet TAKE 1 TABLET BY MOUTH EVERY DAY   [DISCONTINUED] phenytoin (DILANTIN) 100 MG ER capsule TAKE 2 CAPSULES BY MOUTH 2 TIMES DAILY.   No facility-administered medications prior to visit.    Review of Systems  All other systems reviewed and are negative.         Objective:     BP 122/74   Pulse (!) 50   Temp (!) 97.4 F (36.3 C) (Oral)   Resp 14   Ht 5' 4.5" (1.638 m)   Wt  208 lb 6.4 oz (94.5 kg)   SpO2 98% Comment: room air  BMI 35.22 kg/m    Physical Exam   Alert and in no distress. Tympanic membranes and canals are normal. Pharyngeal area is normal. Neck is supple without adenopathy or thyromegaly. Cardiac exam shows a regular sinus rhythm without murmurs or gallops. Lungs are clear to auscultation.      Assessment & Plan:    Annual physical exam - Plan: POCT Urinalysis DIP (Proadvantage Device)  Primary hypertension - Plan: losartan-hydrochlorothiazide (HYZAAR) 50-12.5 MG tablet  Allergic rhinitis due to pollen, unspecified seasonality  Seizure disorder ([place]) - Plan: phenytoin (DILANTIN) 100 MG ER capsule, Phenytoin level, free and total, CANCELED: Phenytoin level, free and total  Psoriatic arthritis  ([facility])  Post-menopausal  Obesity (BMI 30-39.9)  History of vitamin D deficiency - Plan: VITAMIN D 25 Hydroxy (Vit-D Deficiency, Fractures)  History of COVID-19  Screening for lipid disorders - Plan: Lipid panel  History of gastroesophageal reflux (GERD)  Immunization History  Administered Date(s) Administered   DTaP 02/06/1994, 11/15/2004   Fluad Quad(high Dose 65+) 05/23/2022   Influenza Inj Mdck Quad Pf 03/10/2017   Influenza Whole 07/24/2009   Influenza,inj,Quad PF,6+ Mos 03/29/2016, 03/24/2018, 03/22/2019, 03/22/2020   Influenza-Unspecified 03/10/2014, 03/10/2017   PPD Test 10/20/1992   Pneumococcal Conjugate-13 12/18/2007   Pneumococcal Polysaccharide-23 03/31/2014   Tdap 03/29/2016   Zoster Recombinat (Shingrix) 12/15/2018, 03/22/2019    Health Maintenance  Topic Date Due   COVID-19 Vaccine (1) 07/25/2022 (Originally 07/21/1960)   MAMMOGRAM  01/04/2023   Fecal DNA (Cologuard)  01/07/2025   DTaP/Tdap/Td (4 - Td or Tdap) 03/29/2026   DEXA SCAN  Completed   Hepatitis C Screening  Completed   Zoster Vaccines- Shingrix  Completed   HPV VACCINES  Aged Out   Pneumonia Vaccine 69+ Years old  Discontinued   COLONOSCOPY (Pts 45-18yr Insurance coverage will need to be confirmed)  Discontinued   She will continue on her present medication regimen and also follow-up with rheumatology.  Continue to treat her allergies and reflux as needed. Discussed health benefits of physical activity, and encouraged her to engage in regular exercise appropriate for her age and condition.  Problem List Items Addressed This Visit     Allergic rhinitis due to pollen   History of COVID-19   History of gastroesophageal reflux (GERD)   History of vitamin D deficiency   Relevant Orders   VITAMIN D 25 Hydroxy (Vit-D Deficiency, Fractures)   Hypertension   Relevant Medications   losartan-hydrochlorothiazide (HYZAAR) 50-12.5 MG tablet   Obesity (BMI 30-39.9)   Post-menopausal   Psoriatic  arthritis (HCC)   Seizure disorder (HCC)   Relevant Medications   phenytoin (DILANTIN) 100 MG ER capsule   Other Relevant Orders   Phenytoin level, free and total   Other Visit Diagnoses     Annual physical exam    -  Primary   Relevant Orders   POCT Urinalysis DIP (Proadvantage Device) (Completed)   Screening for lipid disorders       Relevant Orders   Lipid panel      Recheck here yearly.   JJill Alexanders MD

## 2022-07-19 LAB — LIPID PANEL
Chol/HDL Ratio: 2.3 ratio (ref 0.0–4.4)
Cholesterol, Total: 230 mg/dL — ABNORMAL HIGH (ref 100–199)
HDL: 99 mg/dL (ref >39)
LDL Chol Calc (NIH): 118 mg/dL — ABNORMAL HIGH (ref 0–99)
Triglycerides: 77 mg/dL (ref 0–149)
VLDL Cholesterol Cal: 13 mg/dL (ref 5–40)

## 2022-07-19 LAB — PHENYTOIN LEVEL, FREE AND TOTAL
Phenytoin, Free: NOT DETECTED ug/mL (ref 1.0–2.0)
Phenytoin, Serum: 3.8 ug/mL — ABNORMAL LOW (ref 10.0–20.0)

## 2022-07-19 LAB — VITAMIN D 25 HYDROXY (VIT D DEFICIENCY, FRACTURES): Vit D, 25-Hydroxy: 22.5 ng/mL — ABNORMAL LOW (ref 30.0–100.0)

## 2022-07-24 ENCOUNTER — Other Ambulatory Visit: Payer: Self-pay | Admitting: *Deleted

## 2022-07-24 DIAGNOSIS — L4050 Arthropathic psoriasis, unspecified: Secondary | ICD-10-CM

## 2022-07-24 DIAGNOSIS — L409 Psoriasis, unspecified: Secondary | ICD-10-CM

## 2022-07-24 DIAGNOSIS — Z79899 Other long term (current) drug therapy: Secondary | ICD-10-CM

## 2022-07-24 MED ORDER — ENBREL SURECLICK 50 MG/ML ~~LOC~~ SOAJ: SUBCUTANEOUS | 0 refills | Status: DC

## 2022-07-24 MED ORDER — METHOTREXATE SODIUM CHEMO INJECTION (PF) 50 MG/2ML: INTRAMUSCULAR | 2 refills | Status: DC

## 2022-07-24 NOTE — Telephone Encounter (Signed)
Next Visit: 11/14/2022  Last Visit: 06/13/2022  Last Fill: 03/19/2022 MTX, 01/08/2022 Enbrel  DX: Psoriatic arthritis    Current Dose per office note [id number]: Enbrel SureClick 50 mg sq injections every 7 days, methotrexate 0.3 ml sq injections every other week   Labs: 06/13/2022 CBC and CMP WNL    TB Gold: 01/10/2022 TB Gold is negative.     Okay to refill Enbrel and MTX?

## 2022-09-14 ENCOUNTER — Other Ambulatory Visit: Payer: Self-pay | Admitting: Physician Assistant

## 2022-09-23 ENCOUNTER — Telehealth: Payer: Self-pay | Admitting: Rheumatology

## 2022-09-23 NOTE — Telephone Encounter (Signed)
Patient advised her prescription was declined because it was too early to refill. Patient expressed understanding. Patient advised she is due for labs. Patient states she will come to the office tomorrow to update them.

## 2022-09-23 NOTE — Telephone Encounter (Signed)
Patient called the office stating the pharmacy she gets her Enbrel from, called her to advise her that her Enbrel refill was denied. Patient states she would like to know why it was denied. Please advise.

## 2022-09-24 ENCOUNTER — Other Ambulatory Visit: Payer: Self-pay | Admitting: *Deleted

## 2022-09-24 DIAGNOSIS — Z79899 Other long term (current) drug therapy: Secondary | ICD-10-CM

## 2022-09-25 LAB — CBC WITH DIFFERENTIAL/PLATELET
Absolute Monocytes: 440 cells/uL (ref 200–950)
Basophils Absolute: 20 cells/uL (ref 0–200)
Basophils Relative: 0.4 %
Eosinophils Absolute: 100 cells/uL (ref 15–500)
Eosinophils Relative: 2 %
HCT: 34.3 % — ABNORMAL LOW (ref 35.0–45.0)
Hemoglobin: 11.5 g/dL — ABNORMAL LOW (ref 11.7–15.5)
Lymphs Abs: 2460 cells/uL (ref 850–3900)
MCH: 31 pg (ref 27.0–33.0)
MCHC: 33.5 g/dL (ref 32.0–36.0)
MCV: 92.5 fL (ref 80.0–100.0)
MPV: 10.4 fL (ref 7.5–12.5)
Monocytes Relative: 8.8 %
Neutro Abs: 1980 cells/uL (ref 1500–7800)
Neutrophils Relative %: 39.6 %
Platelets: 226 10*3/uL (ref 140–400)
RBC: 3.71 10*6/uL — ABNORMAL LOW (ref 3.80–5.10)
RDW: 12.1 % (ref 11.0–15.0)
Total Lymphocyte: 49.2 %
WBC: 5 10*3/uL (ref 3.8–10.8)

## 2022-09-25 LAB — COMPLETE METABOLIC PANEL WITH GFR
AG Ratio: 1.5 (calc) (ref 1.0–2.5)
ALT: 14 U/L (ref 6–29)
AST: 21 U/L (ref 10–35)
Albumin: 4.1 g/dL (ref 3.6–5.1)
Alkaline phosphatase (APISO): 76 U/L (ref 37–153)
BUN: 18 mg/dL (ref 7–25)
CO2: 29 mmol/L (ref 20–32)
Calcium: 9.6 mg/dL (ref 8.6–10.4)
Chloride: 104 mmol/L (ref 98–110)
Creat: 0.69 mg/dL (ref 0.50–1.05)
Globulin: 2.8 g/dL (calc) (ref 1.9–3.7)
Glucose, Bld: 86 mg/dL (ref 65–139)
Potassium: 4.1 mmol/L (ref 3.5–5.3)
Sodium: 140 mmol/L (ref 135–146)
Total Bilirubin: 0.3 mg/dL (ref 0.2–1.2)
Total Protein: 6.9 g/dL (ref 6.1–8.1)
eGFR: 95 mL/min/{1.73_m2} (ref >=60)

## 2022-09-25 NOTE — Progress Notes (Signed)
Globin is low at 11.5.  Patient should take multivitamin with iron.  CMP is normal.  Please forward results to PCP.

## 2022-10-28 ENCOUNTER — Other Ambulatory Visit: Payer: Self-pay | Admitting: *Deleted

## 2022-10-28 MED ORDER — ENBREL SURECLICK 50 MG/ML ~~LOC~~ SOAJ: SUBCUTANEOUS | 0 refills | Status: DC

## 2022-10-28 NOTE — Telephone Encounter (Signed)
Last Fill: 07/24/2022  Labs: 09/24/2022 Globin is low at 11.5.  Patient should take multivitamin with iron.  CMP is normal.  Please forward results to PCP.   TB Gold: 01/11/2023   TB Gold is negative.   Next Visit: 11/14/2022  Last Visit: 06/13/2022  NU:[id number] arthritis   Current Dose per office note 06/13/2022: Enbrel SureClick 50 mg sq injections every 7 days,   Okay to refill Enbrel?

## 2022-10-31 NOTE — Progress Notes (Unsigned)
Office Visit Note  Patient: Kimberly Butler             Date of Birth: 06-May-1956           MRN: [id number]             PCP: Ronnald Nian, MD Referring: Ronnald Nian, MD Visit Date: 11/14/2022 Occupation: @GUAROCC @  Subjective:  Right trochanteric bursitis   History of Present Illness: Kimberly Butler is a 67 y.o. female with history of psoriatic arthritis and osteoarthritis.  Patient remains on Enbrel SureClick 50 mg sq injections every 7 days, methotrexate 0.3 ml sq injections every other week-due to hair loss (taking nutrafol), and folic acid 2mg .  She continues to tolerate Enbrel and methotrexate as prescribed.  She has not missed any doses recently.  Patient states that she has started to notice increased morning stiffness lasting for about 5 minutes daily.  She is also had some increased discomfort on the lateral aspect of her right hip consistent with trochanteric bursitis.  She denies any joint swelling at this time.  She denies any SI joint discomfort.  She denies any Achilles tendinitis or plantar fasciitis.  She has a few small scattered patches of psoriasis on her forearms.  She has not been using any topical agents recently.  She denies any new medical conditions.  She denies any recent or recurrent infections. She has been trying to walk for exercise.    Activities of Daily Living:  Patient reports morning stiffness for 5 minutes.   Patient Denies nocturnal pain.  Difficulty dressing/grooming: Denies Difficulty climbing stairs: Denies Difficulty getting out of chair: Denies Difficulty using hands for taps, buttons, cutlery, and/or writing: Denies  Review of Systems  Constitutional:  Negative for fatigue.  HENT:  Negative for mouth sores, mouth dryness and nose dryness.   Eyes:  Negative for pain, visual disturbance and dryness.  Respiratory:  Negative for cough, hemoptysis, shortness of breath and difficulty breathing.   Cardiovascular:  Positive for swelling in  legs/feet. Negative for chest pain, palpitations and hypertension.  Gastrointestinal:  Negative for blood in stool, constipation and diarrhea.  Endocrine: Negative for increased urination.  Genitourinary:  Negative for painful urination and involuntary urination.  Musculoskeletal:  Positive for morning stiffness. Negative for joint pain, gait problem, joint pain, joint swelling, myalgias, muscle weakness, muscle tenderness and myalgias.  Skin:  Positive for hair loss. Negative for color change, pallor, rash, nodules/bumps, skin tightness, ulcers and sensitivity to sunlight.  Allergic/Immunologic: Negative for susceptible to infections.  Neurological:  Negative for dizziness, numbness, headaches and weakness.  Hematological:  Negative for swollen glands.  Psychiatric/Behavioral:  Negative for depressed mood and sleep disturbance. The patient is not nervous/anxious.     PMFS History:  Patient Active Problem List   Diagnosis Date Noted   Family history of breast cancer 07/11/2021   History of COVID-19 07/31/2020   Lactose intolerance in adult 11/18/2016   Post-menopausal 08/28/2016   High risk medication use 08/28/2016   History of vitamin D deficiency 08/28/2016   History of gastroesophageal reflux (GERD) 08/28/2016   History of iron deficiency anemia 08/28/2016   Psoriasis 07/07/2014   Seizure disorder (HCC) 01/07/2011   Hypertension 01/07/2011   Psoriatic arthritis (HCC) 01/07/2011   Allergic rhinitis due to pollen 01/07/2011   Obesity (BMI 30-39.9) 01/07/2011    Past Medical History:  Diagnosis Date   Allergy    RHINITIS   Cancer (HCC)    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  Epilepsy (HCC)    hasn't had seizure in 20 years (01/15/17)   GERD (gastroesophageal reflux disease)    Hypertension    Obesity    Psoriasis    Psoriatic arthritis (HCC)    Smoker    Varicose veins     Family History  Problem Relation Age of Onset   Cancer Mother    Heart disease Mother    Arthritis Father     Kidney failure Father    Dementia Father    Diabetes Father    Cancer Sister    Cancer Brother    Healthy Son    Past Surgical History:  Procedure Laterality Date   CATARACT EXTRACTION W/ INTRAOCULAR LENS IMPLANT Bilateral    CESAREAN SECTION     CHOLECYSTECTOMY N/A 01/23/2017   Procedure: LAPAROSCOPIC CHOLECYSTECTOMY;  Surgeon: Violeta Gelinas, MD;  Location: [facility] OR;  Service: General;  Laterality: N/A;   TONSILECTOMY, ADENOIDECTOMY, BILATERAL MYRINGOTOMY AND TUBES     TUBAL LIGATION     Social History   Social History Narrative   Not on file   Immunization History  Administered Date(s) Administered   DTaP 02/06/1994, 11/15/2004   Fluad Quad(high Dose 65+) 05/23/2022   Influenza Inj Mdck Quad Pf 03/10/2017   Influenza Whole 07/24/2009   Influenza,inj,Quad PF,6+ Mos 03/29/2016, 03/24/2018, 03/22/2019, 03/22/2020   Influenza-Unspecified 03/10/2014, 03/10/2017   PPD Test 10/20/1992   Pneumococcal Conjugate-13 12/18/2007   Pneumococcal Polysaccharide-23 03/31/2014   Tdap 03/29/2016   Zoster Recombinat (Shingrix) 12/15/2018, 03/22/2019     Objective: Vital Signs: BP 107/67 (BP Location: Left Arm, Patient Position: Sitting, Cuff Size: Normal)   Pulse 60   Resp 16   Ht 5\' 7"  (1.702 m)   Wt 208 lb (94.3 kg)   BMI 32.58 kg/m    Physical Exam Vitals and nursing note reviewed.  Constitutional:      Appearance: She is well-developed.  HENT:     Head: Normocephalic and atraumatic.  Eyes:     Conjunctiva/sclera: Conjunctivae normal.  Cardiovascular:     Rate and Rhythm: Normal rate and regular rhythm.     Heart sounds: Normal heart sounds.  Pulmonary:     Effort: Pulmonary effort is normal.     Breath sounds: Normal breath sounds.  Abdominal:     General: Bowel sounds are normal.     Palpations: Abdomen is soft.  Musculoskeletal:     Cervical back: Normal range of motion.  Lymphadenopathy:     Cervical: No cervical adenopathy.  Skin:    General: Skin is warm and  dry.     Capillary Refill: Capillary refill takes less than 2 seconds.  Neurological:     Mental Status: She is alert and oriented to person, place, and time.  Psychiatric:        Behavior: Behavior normal.      Musculoskeletal Exam: C-spine, thoracic spine, lumbar spine good range of motion.  Shoulder joints, elbow joints, wrist joints, MCPs, PIPs, DIPs have good range of motion with no synovitis.  Complete fist formation bilaterally.  Hip joints have good range of motion with no groin pain.  Knee joints have good range of motion with no warmth or effusion.  Ankle joints have good range of motion with no tenderness or joint swelling.  CDAI Exam: CDAI Score: -- Patient Global: --; Provider Global: -- Swollen: --; Tender: -- Joint Exam 11/14/2022   No joint exam has been documented for this visit   There is currently no information documented on  the homunculus. Go to the Rheumatology activity and complete the homunculus joint exam.  Investigation: No additional findings.  Imaging: No results found.  Recent Labs: Lab Results  Component Value Date   WBC 5.0 09/24/2022   HGB 11.5 (L) 09/24/2022   PLT 226 09/24/2022   NA 140 09/24/2022   K 4.1 09/24/2022   CL 104 09/24/2022   CO2 29 09/24/2022   GLUCOSE 86 09/24/2022   BUN 18 09/24/2022   CREATININE 0.69 09/24/2022   BILITOT 0.3 09/24/2022   ALKPHOS 93 07/11/2021   AST 21 09/24/2022   ALT 14 09/24/2022   PROT 6.9 09/24/2022   ALBUMIN 4.6 07/11/2021   CALCIUM 9.6 09/24/2022   GFRAA 85 11/01/2020   QFTBGOLDPLUS NEGATIVE 01/10/2022    Speciality Comments: No specialty comments available.  Procedures:  No procedures performed Allergies: Metoclopramide and Promethazine   Assessment / Plan:     Visit Diagnoses: Psoriatic arthritis (HCC) -She has no synovitis or dactylitis on examination today.  She has not had any signs or symptoms of a psoriatic arthritis flare.  She has clinically been doing well on Enbrel 50 mg  subcutaneous injections every 7 days and methotrexate 0.3 mL subcu as injections once every other week.  She is tolerating combination therapy without any side effects or injection site reactions.  She has not had any recent or recurrent infections.  Her morning stiffness has been lasting for about 5 minutes daily.  She has not had any nocturnal pain.  She has no SI joint tenderness upon palpation today.  No evidence of Achilles tendinitis or plantar fasciitis.  No medication changes will be made at this time.  A refill of methotrexate was sent to the pharmacy.  She was advised to notify us if she develops signs or symptoms of a flare.  She will follow-up in the office in 5 months or sooner if needed.  Plan: Methotrexate Sodium (METHOTREXATE, PF,) 50 MG/2ML injection  Psoriasis - She has small scattered patches of psoriasis on both forearms.  Not using any topical agents currently.  She will remain on combination therapy.   Plan: Methotrexate Sodium (METHOTREXATE, PF,) 50 MG/2ML injection  High risk medication use - Enbrel SureClick 50 mg sq injections every 7 days, methotrexate 0.3 ml sq injections every other week-due to hair loss (taking nutrafol), and folic acid 2mg .  CBC and CMP updated on 09/24/22. Her next lab work will be due in July and every 3 months. Standing orders for CBC and CMP were placed today.   TB gold negative on 01/10/22.  Future order for TB gold placed today.  No recent or recurrent infections. Discussed the importance of holding enbrel and methotrexate if she develops signs or symptoms of an infection and to resume once the infection has completely cleared.   - Plan: Methotrexate Sodium (METHOTREXATE, PF,) 50 MG/2ML injection, QuantiFERON-TB Gold Plus  Screening for tuberculosis - Future order TB gold placed today.   Plan: QuantiFERON-TB Gold Plus  Primary osteoarthritis of both hands: She has PIP and DIP thickening consistent with osteoarthritis of both hands.  No tenderness or  inflammation noted on examination today.  Primary osteoarthritis of both knees: Good range of motion of both knee joints noted on examination today.  No warmth or effusion noted.  She has been walking for exercise.  Trochanteric bursitis, right hip: She presents today with pain on the lateral aspect of her right hip.  She has tenderness over the right trochanteric bursa on examination today.  Different treatment options were discussed.  She was given a handout of exercises to perform.  She will notify us when and if she would like referral to placed to physical therapy.  She will notify us if her symptoms persist or worsen at which time she may need a right trochanteric bursa cortisone injection in the future.  Other medical conditions are listed as follows:   Vitamin D deficiency: She is taking vitamin D 5000 units once daily.   Essential hypertension: Blood pressure was 107/67 today in the office.  History of gastroesophageal reflux (GERD)  History of epilepsy  History of iron deficiency anemia  Orders: Orders Placed This Encounter  Procedures   QuantiFERON-TB Gold Plus   Meds ordered this encounter  Medications   Methotrexate Sodium (METHOTREXATE, PF,) 50 MG/2ML injection    Sig: Inject 0.3 ml sq injections every other week    Dispense:  8 mL    Refill:  2    DX Code Needed  .     Follow-Up Instructions: Return in about 5 months (around 04/16/2023) for Psoriatic arthritis, Osteoarthritis.   Gearldine Bienenstock, PA-C  Note - This record has been created using Dragon software.  Chart creation errors have been sought, but may not always  have been located. Such creation errors do not reflect on  the standard of medical care.

## 2022-11-14 ENCOUNTER — Ambulatory Visit: Payer: No Typology Code available for payment source | Attending: Physician Assistant | Admitting: Physician Assistant

## 2022-11-14 ENCOUNTER — Encounter: Payer: Self-pay | Admitting: Physician Assistant

## 2022-11-14 VITALS — BP 107/67 | HR 60 | Resp 16 | Ht 67.0 in | Wt 208.0 lb

## 2022-11-14 DIAGNOSIS — L4050 Arthropathic psoriasis, unspecified: Secondary | ICD-10-CM | POA: Diagnosis not present

## 2022-11-14 DIAGNOSIS — I10 Essential (primary) hypertension: Secondary | ICD-10-CM

## 2022-11-14 DIAGNOSIS — L409 Psoriasis, unspecified: Secondary | ICD-10-CM | POA: Diagnosis not present

## 2022-11-14 DIAGNOSIS — Z8719 Personal history of other diseases of the digestive system: Secondary | ICD-10-CM

## 2022-11-14 DIAGNOSIS — M19041 Primary osteoarthritis, right hand: Secondary | ICD-10-CM

## 2022-11-14 DIAGNOSIS — Z111 Encounter for screening for respiratory tuberculosis: Secondary | ICD-10-CM

## 2022-11-14 DIAGNOSIS — Z8669 Personal history of other diseases of the nervous system and sense organs: Secondary | ICD-10-CM

## 2022-11-14 DIAGNOSIS — Z79899 Other long term (current) drug therapy: Secondary | ICD-10-CM

## 2022-11-14 DIAGNOSIS — M7061 Trochanteric bursitis, right hip: Secondary | ICD-10-CM

## 2022-11-14 DIAGNOSIS — E559 Vitamin D deficiency, unspecified: Secondary | ICD-10-CM

## 2022-11-14 DIAGNOSIS — M19042 Primary osteoarthritis, left hand: Secondary | ICD-10-CM

## 2022-11-14 DIAGNOSIS — Z862 Personal history of diseases of the blood and blood-forming organs and certain disorders involving the immune mechanism: Secondary | ICD-10-CM

## 2022-11-14 DIAGNOSIS — M170 Bilateral primary osteoarthritis of knee: Secondary | ICD-10-CM

## 2022-11-14 MED ORDER — METHOTREXATE SODIUM CHEMO INJECTION (PF) 50 MG/2ML: INTRAMUSCULAR | 2 refills | Status: DC

## 2022-11-14 NOTE — Patient Instructions (Addendum)
Standing Labs We placed an order today for your standing lab work.   Please have your standing labs drawn in Mid-July and every 3 months   Please have your labs drawn 2 weeks prior to your appointment so that the provider can discuss your lab results at your appointment, if possible.  Please note that you may see your imaging and lab results in MyChart before we have reviewed them. We will contact you once all results are reviewed. Please allow our office up to 72 hours to thoroughly review all of the results before contacting the office for clarification of your results.  WALK-IN LAB HOURS  Monday through Thursday from 8:00 am -12:30 pm and 1:00 pm-5:00 pm and Friday from 8:00 am-12:00 pm.  Patients with office visits requiring labs will be seen before walk-in labs.  You may encounter longer than normal wait times. Please allow additional time. Wait times may be shorter on  Monday and Thursday afternoons.  We do not book appointments for walk-in labs. We appreciate your patience and understanding with our staff.   Labs are drawn by [facility]. Please bring your co-pay at the time of your lab draw.  You may receive a bill from [facility] for your lab work.  Please note if you are on Hydroxychloroquine and and an order has been placed for a Hydroxychloroquine level,  you will need to have it drawn 4 hours or more after your last dose.  If you wish to have your labs drawn at another location, please call the office 24 hours in advance so we can fax the orders.  The office is located at [address], [place], [place] [address]   If you have any questions regarding directions or hours of operation,  please call [phone].   As a reminder, please drink plenty of water prior to coming for your lab work. Thanks!  Hip Bursitis Rehab Ask your health care provider which exercises are safe for you. Do exercises exactly as told by your health care provider and adjust them as directed. It is  normal to feel mild stretching, pulling, tightness, or discomfort as you do these exercises. Stop right away if you feel sudden pain or your pain gets worse. Do not begin these exercises until told by your health care provider. Stretching exercise This exercise warms up your muscles and joints and improves the movement and flexibility of your hip. This exercise also helps to relieve pain and stiffness. Iliotibial band stretch An iliotibial band is a strong band of muscle tissue that runs from the outer side of your hip to the outer side of your thigh and knee. Lie on your side with your left / right leg in the top position. Bend your left / right knee and grab your ankle. Stretch out your bottom arm to help you balance. Slowly bring your knee back so your thigh is slightly behind your body. Slowly lower your knee toward the floor until you feel a gentle stretch on the outside of your left / right thigh. If you do not feel a stretch and your knee will not lower more toward the floor, place the heel of your other foot on top of your knee and pull your knee down toward the floor with your foot. Hold this position for __________ seconds. Slowly return to the starting position. Repeat __________ times. Complete this exercise __________ times a day. Strengthening exercises These exercises build strength and endurance in your hip and pelvis. Endurance is the  ability to use your muscles for a long time, even after they get tired. Bridge This exercise strengthens the muscles that move your thigh backward (hip extensors). Lie on your back on a firm surface with your knees bent and your feet flat on the floor. Tighten your buttocks muscles and lift your buttocks off the floor until your trunk is level with your thighs. Do not arch your back. You should feel the muscles working in your buttocks and the back of your thighs. If you do not feel these muscles, slide your feet 1-2 inches (2.5-5 cm) farther away  from your buttocks. If this exercise is too easy, try doing it with your arms crossed over your chest. Hold this position for __________ seconds. Slowly lower your hips to the starting position. Let your muscles relax completely after each repetition. Repeat __________ times. Complete this exercise __________ times a day. Squats This exercise strengthens the muscles in front of your thigh and knee (quadriceps). Stand in front of a table, with your feet and knees pointing straight ahead. You may rest your hands on the table for balance but not for support. Slowly bend your knees and lower your hips like you are going to sit in a chair. Keep your weight over your heels, not over your toes. Keep your lower legs upright so they are parallel with the table legs. Do not let your hips go lower than your knees. Do not bend lower than told by your health care provider. If your hip pain increases, do not bend as low. Hold the squat position for __________ seconds. Slowly push with your legs to return to standing. Do not use your hands to pull yourself to standing. Repeat __________ times. Complete this exercise __________ times a day. Hip hike  Stand sideways on a bottom step. Stand on your left / right leg with your other foot unsupported next to the step. You can hold on to the railing or wall for balance if needed. Keep your knees straight and your torso square. Then lift your left / right hip up toward the ceiling. Hold this position for __________ seconds. Slowly let your left / right hip lower toward the floor, past the starting position. Your foot should get closer to the floor. Do not lean or bend your knees. Repeat __________ times. Complete this exercise __________ times a day. Single leg stand This exercise increases your balance. Without shoes, stand near a railing or in a doorway. You may hold on to the railing or door frame as needed for balance. Squeeze your left / right buttock  muscles, then lift up your other foot. Do not let your left / right hip push out to the side. It is helpful to stand in front of a mirror for this exercise so you can watch your hip. Hold this position for __________ seconds. Repeat __________ times. Complete this exercise __________ times a day. This information is not intended to replace advice given to you by your health care provider. Make sure you discuss any questions you have with your health care provider. Document Revised: 05/09/2021 Document Reviewed: 05/09/2021 Elsevier Patient Education  2024 [employer].

## 2022-12-17 ENCOUNTER — Ambulatory Visit (INDEPENDENT_AMBULATORY_CARE_PROVIDER_SITE_OTHER): Payer: No Typology Code available for payment source | Admitting: Family Medicine

## 2022-12-17 ENCOUNTER — Encounter: Payer: Self-pay | Admitting: Family Medicine

## 2022-12-17 VITALS — BP 128/60 | HR 49 | Temp 97.7°F | Resp 16 | Wt 209.4 lb

## 2022-12-17 DIAGNOSIS — I809 Phlebitis and thrombophlebitis of unspecified site: Secondary | ICD-10-CM | POA: Diagnosis not present

## 2022-12-17 DIAGNOSIS — B351 Tinea unguium: Secondary | ICD-10-CM | POA: Diagnosis not present

## 2022-12-17 NOTE — Progress Notes (Signed)
   Subjective:    Patient ID: Kimberly Butler, female    DOB: Nov 24, 1955, 67 y.o.   MRN: [id number]  HPI She is here for consult concerning some discomfort in the right anterior shin area that is very localized in nature.  No history of injury or overuse.  She dates this to late June.  She now states that is doing slightly better. She also has concerns about thickening of her toenails.   Review of Systems     Objective:    Physical Exam Exam of the right lower extremity shows some slight swelling and discoloration superficially that is slightly tender to palpation.  Negative Homans' sign. She does have thickening of the toenails bilaterally.       Assessment & Plan:   Problem List Items Addressed This Visit   None Visit Diagnoses     Onychomycosis    -  Primary   Superficial phlebitis         Since the superficial phlebitis is getting better, no further intervention needed. I discussed the treatment of the onychomycosis with medication that would take several months.  She opted to hold off on doing anything about this.

## 2022-12-22 ENCOUNTER — Other Ambulatory Visit: Payer: Self-pay | Admitting: Family Medicine

## 2022-12-22 DIAGNOSIS — G40909 Epilepsy, unspecified, not intractable, without status epilepticus: Secondary | ICD-10-CM

## 2023-01-04 ENCOUNTER — Other Ambulatory Visit: Payer: Self-pay | Admitting: Physician Assistant

## 2023-01-04 DIAGNOSIS — Z79899 Other long term (current) drug therapy: Secondary | ICD-10-CM

## 2023-01-06 ENCOUNTER — Ambulatory Visit
Admission: RE | Admit: 2023-01-06 | Discharge: 2023-01-06 | Disposition: A | Discharge status: Home / Self Care | Payer: No Typology Code available for payment source | Source: Ambulatory Visit | Attending: Family Medicine | Admitting: Family Medicine

## 2023-01-06 ENCOUNTER — Encounter: Payer: Self-pay | Admitting: Family Medicine

## 2023-01-06 ENCOUNTER — Ambulatory Visit: Payer: No Typology Code available for payment source | Admitting: Family Medicine

## 2023-01-06 VITALS — BP 124/78 | HR 56 | Wt 208.6 lb

## 2023-01-06 DIAGNOSIS — Z79899 Other long term (current) drug therapy: Secondary | ICD-10-CM | POA: Diagnosis not present

## 2023-01-06 DIAGNOSIS — M25562 Pain in left knee: Secondary | ICD-10-CM

## 2023-01-06 DIAGNOSIS — L4050 Arthropathic psoriasis, unspecified: Secondary | ICD-10-CM

## 2023-01-06 LAB — CBC WITH DIFFERENTIAL/PLATELET
Basophils Absolute: 0 10*3/uL (ref 0.0–0.2)
Basos: 1 %
EOS (ABSOLUTE): 0.2 10*3/uL (ref 0.0–0.4)
Eos: 4 %
Hematocrit: 35.1 % (ref 34.0–46.6)
Hemoglobin: 12.1 g/dL (ref 11.1–15.9)
Immature Grans (Abs): 0 10*3/uL (ref 0.0–0.1)
Immature Granulocytes: 0 %
Lymphocytes Absolute: 2.1 10*3/uL (ref 0.7–3.1)
Lymphs: 42 %
MCH: 31.7 pg (ref 26.6–33.0)
MCHC: 34.5 g/dL (ref 31.5–35.7)
MCV: 92 fL (ref 79–97)
Monocytes Absolute: 0.5 10*3/uL (ref 0.1–0.9)
Monocytes: 11 %
Neutrophils Absolute: 2.1 10*3/uL (ref 1.4–7.0)
Neutrophils: 42 %
Platelets: 233 10*3/uL (ref 150–450)
RBC: 3.82 x10E6/uL (ref 3.77–5.28)
RDW: 12.1 % (ref 11.7–15.4)
WBC: 5 10*3/uL (ref 3.4–10.8)

## 2023-01-06 LAB — COMPREHENSIVE METABOLIC PANEL

## 2023-01-06 NOTE — Patient Instructions (Signed)
2 extra strength Tylenol 3 times per day as needed for your pain

## 2023-01-06 NOTE — Telephone Encounter (Signed)
Last Fill: 10/28/2022  Labs: 09/24/2022 Globin is low at 11.5.   CMP is normal.   TB Gold: 01/10/2022  Next Visit: 04/16/2023  Last Visit: 11/14/2022  DX: Psoriatic arthritis   Current Dose per office note 11/14/2022: Enbrel SureClick 50 mg sq injections every 7 days   Patient advised she is due to update labs. Patient will update them this week.   Okay to refill Enbrel?

## 2023-01-06 NOTE — Progress Notes (Signed)
   Subjective:    Patient ID: Kimberly Butler, female    DOB: 03-02-1956, 67 y.o.   MRN: [id number]  HPI She states that over the last week she has noted increased difficulty with left knee pain.  He gets worse with physical activity.  She has taken ibuprofen occasionally in the past for this with quizzical results.  She does have underlying psoriatic arthritis and is on methotrexate and Enbrel.  She is scheduled for blood work tomorrow so I will go ahead and schedule her today.   Review of Systems     Objective:    Physical Exam Left knee exam shows no effusion.  No joint line tenderness.  Negative McMurray's testing.  Ligaments intact.       Assessment & Plan:  High risk medication use - Plan: CBC with Differential/Platelet, Comprehensive metabolic panel  Psoriatic arthritis (HCC) - Plan: CBC with Differential/Platelet, Comprehensive metabolic panel, Ambulatory referral to Physical Therapy  Acute pain of left knee - Plan: DG Knee Complete 4 Views Left, Ambulatory referral to Physical Therapy I explained that her symptoms are probably arthritis related.  I will recommend conservative care.  Discussed the use of Tylenol in regard to liver function as well as NSAID in regard to kidney function explained the minimum effective dosing is the key.  Also explained that physical therapy and weight reduction can help with her pain.

## 2023-01-07 ENCOUNTER — Telehealth: Payer: Self-pay | Admitting: Pharmacist

## 2023-01-07 NOTE — Telephone Encounter (Addendum)
Patient is planning to retire by the end of the year (possibly Dec 24th). She states she is considering enrolling into a BC[employer] Plan G supplement but has not yet enrolled in anything. She has been advised to collect as much Enbrel as possible to allow time for BIV processing during insurance transition period.  Reviewed [employer] PAP income threshold for household of one. Reviewed that income threshold may change with 2025 calendar year due to Medicare Part D restructuring.  Briefly reviewed infusion options such as [facility] which would be fully covered after she meets Part B deductible (if she plans on enrolling into a supplemental Plan G). Patient will f/u with pharmacy team when new insurance is active  Chesley Mires, PharmD, MPH, BCPS, CPP Clinical Pharmacist (Rheumatology and Pulmonology)  ----- Message from Elmyra Ricks sent at 01/07/2023  8:49 AM EDT ----- Patient is planning to retire the end of the year. Patient has questions regarding patient assistance for Enbrel.

## 2023-01-07 NOTE — Progress Notes (Signed)
CBC WNL

## 2023-01-09 LAB — HM MAMMOGRAPHY

## 2023-01-13 ENCOUNTER — Telehealth: Payer: Self-pay

## 2023-01-13 NOTE — Telephone Encounter (Signed)
Pt called requesting xray result prior to pt appt tomorrow. Would like to know if she should still go to rehab.

## 2023-01-14 ENCOUNTER — Other Ambulatory Visit: Payer: Self-pay

## 2023-01-14 ENCOUNTER — Encounter: Payer: Self-pay | Admitting: Physical Therapy

## 2023-01-14 ENCOUNTER — Ambulatory Visit: Payer: No Typology Code available for payment source | Admitting: Physical Therapy

## 2023-01-14 DIAGNOSIS — M6281 Muscle weakness (generalized): Secondary | ICD-10-CM | POA: Diagnosis not present

## 2023-01-14 DIAGNOSIS — M25662 Stiffness of left knee, not elsewhere classified: Secondary | ICD-10-CM

## 2023-01-14 DIAGNOSIS — R2681 Unsteadiness on feet: Secondary | ICD-10-CM

## 2023-01-14 DIAGNOSIS — M25562 Pain in left knee: Secondary | ICD-10-CM

## 2023-01-14 DIAGNOSIS — G8929 Other chronic pain: Secondary | ICD-10-CM

## 2023-01-14 NOTE — Therapy (Signed)
OUTPATIENT PHYSICAL THERAPY LOWER EXTREMITY EVALUATION   Patient Name: Kimberly Butler MRN: [id number] DOB:[birth year], 67 y.o., female Today's Date: 01/14/2023  END OF SESSION:  PT End of Session - 01/14/23 1324     Visit Number 1    Number of Visits 17    Date for PT Re-Evaluation 03/11/23    Authorization Type [employer] BIND    Authorization Time Period 01/14/23 to 03/11/23    PT Start Time 1103    PT Stop Time 1142    PT Time Calculation (min) 39 min    Activity Tolerance Patient tolerated treatment well    Behavior During Therapy [facility] for tasks assessed/performed             Past Medical History:  Diagnosis Date   Allergy    RHINITIS   Cancer (HCC)    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   Epilepsy (HCC)    hasn't had seizure in 20 years (01/15/17)   GERD (gastroesophageal reflux disease)    Hypertension    Obesity    Psoriasis    Psoriatic arthritis (HCC)    Smoker    Varicose veins    Past Surgical History:  Procedure Laterality Date   CATARACT EXTRACTION W/ INTRAOCULAR LENS IMPLANT Bilateral    CESAREAN SECTION     CHOLECYSTECTOMY N/A 01/23/2017   Procedure: LAPAROSCOPIC CHOLECYSTECTOMY;  Surgeon: Violeta Gelinas, MD;  Location: [facility] OR;  Service: General;  Laterality: N/A;   TONSILECTOMY, ADENOIDECTOMY, BILATERAL MYRINGOTOMY AND TUBES     TUBAL LIGATION     Patient Active Problem List   Diagnosis Date Noted   Family history of breast cancer 07/11/2021   History of COVID-19 07/31/2020   Lactose intolerance in adult 11/18/2016   Post-menopausal 08/28/2016   High risk medication use 08/28/2016   History of vitamin D deficiency 08/28/2016   History of gastroesophageal reflux (GERD) 08/28/2016   History of iron deficiency anemia 08/28/2016   Psoriasis 07/07/2014   Seizure disorder (HCC) 01/07/2011   Hypertension 01/07/2011   Psoriatic arthritis (HCC) 01/07/2011   Allergic rhinitis due to pollen 01/07/2011   Obesity (BMI 30-39.9) 01/07/2011    PCP: Ronnald Nian,  MD  REFERRING PROVIDER: Ronnald Nian, MD  REFERRING DIAG: L40.50 (ICD-10-CM) - Psoriatic arthritis (HCC) M25.562 (ICD-10-CM) - Acute pain of left knee  THERAPY DIAG:  Chronic pain of left knee  Muscle weakness (generalized)  Stiffness of left knee, not elsewhere classified  Unsteadiness on feet  Rationale for Evaluation and Treatment: Rehabilitation  ONSET DATE: about a month ago   SUBJECTIVE:   SUBJECTIVE STATEMENT: I feel like my pain's not normal. My knee started hurting about a month ago, I'm the type of person to "walk it out" so I kept going. Then when I took a 4 hour pain trip it started hurting again.Sitting and standing doesn't hurt, stairs can hurt depending on the day. Feels like knee pops, or a pressure or something. Its slowing me down just a bit. We were in [place] and all the roads are cobblestones, it was really hard for me to get around there. It takes me a minute to get going when I first stand up.   PERTINENT HISTORY: Skin cancer, epilepsy, HTN, obesity, psoriatic arthritis, current smoker PAIN:  Are you having pain? No 0/10 now but "doesn't feel normal"; when it really hurts "I just take 2 tylenol and roll through"   PRECAUTIONS: Other: hx of epilepsy, no recent seizures   RED FLAGS: None  WEIGHT BEARING RESTRICTIONS: No  FALLS:  Has patient fallen in last 6 months? 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[place]   PLOF: Independent, Independent with basic ADLs, Independent with gait, and Independent with transfers  PATIENT GOALS: stop knee pain, learn what I can do to manage my knee and the muscles strong   NEXT MD VISIT: Referring PRN   OBJECTIVE:     PATIENT SURVEYS:  FOTO 53, predicted 75 in 8 visits   COGNITION: Overall cognitive status: Within functional limits for tasks  assessed     SENSATION: Not tested    MUSCLE LENGTH: Hamstrings tight by observation    PALPATION: No areas TTP   LOWER EXTREMITY ROM:  Active ROM Right eval Left eval  Hip flexion    Hip extension    Hip abduction    Hip adduction    Hip internal rotation    Hip external rotation    Knee flexion -5* -10*  Knee extension 127* 106*  Ankle dorsiflexion    Ankle plantarflexion    Ankle inversion    Ankle eversion     (Blank rows = not tested)  LOWER EXTREMITY MMT:  MMT Right eval Left eval  Hip flexion 3 3  Hip extension    Hip abduction 3+ 3  Hip adduction    Hip internal rotation    Hip external rotation    Knee flexion 4 3+  Knee extension 4 3+  Ankle dorsiflexion 4+ 4+  Ankle plantarflexion    Ankle inversion    Ankle eversion     (Blank rows = not tested)    FUNCTIONAL TESTS:  Timed up and go (TUG): 12 seconds no device; not able to hold tandem stance for more than 5-10 seconds with MinA, unable to hold SLS without MinA from PT     TODAY'S TREATMENT:                                                                                                                              DATE:   Eval  Objective measures, care planning, appropriate education   TherEx  Bridges + ABD into red TB x5 Sidelying hip ABD red TB x5 LAQs red TB x5 HS stretch L LE 1x30 seconds     PATIENT EDUCATION:  Education details: exam findings, POC, HEP, role of focused exercise in addressing arthritis related pain, possible benefits of water PT if she is interested  Person educated: Patient Education method: Explanation, Demonstration, and Handouts Education comprehension: verbalized understanding, returned demonstration, and needs further education  HOME EXERCISE PROGRAM:  Access Code: [email or web address] URL: [email or web address] Date: 01/14/2023 Prepared by: Nedra Hai  Exercises - Supine Bridge with Resistance Band  - 1-2 x daily - 7 x weekly - 1  sets - 10 reps - 1-2  seconds hold - Sidelying Hip Abduction with Resistance at Thighs  - 1-2 x daily - 7 x weekly - 1 sets - 10 reps - 1 second hold - Sitting Knee Extension with Resistance  - 1-2 x daily - 7 x weekly - 1 sets - 10 reps - 2 seconds  hold - Seated Hamstring Stretch  - 1-2 x daily - 7 x weekly - 1 sets - 3 reps - 30 seconds  hold  ASSESSMENT:  CLINICAL IMPRESSION: Patient is a 67 y.o. F who was seen today for physical therapy evaluation and treatment for L knee pain with history of psoriatic OA. Exam with objective findings as above. 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    OBJECTIVE IMPAIRMENTS: Abnormal gait, decreased activity tolerance, decreased balance, decreased mobility, difficulty walking, decreased ROM, decreased strength, hypomobility, impaired flexibility, obesity, and pain.   ACTIVITY LIMITATIONS: squatting, stairs, transfers, and locomotion level  PARTICIPATION LIMITATIONS: driving, shopping, community activity, occupation, and yard work  PERSONAL FACTORS: Age, Behavior pattern, Education, Fitness, Past/current experiences, Social background, and Time since onset of injury/illness/exacerbation are also affecting patient's functional outcome.   REHAB POTENTIAL: Good  CLINICAL DECISION MAKING: Stable/uncomplicated  EVALUATION COMPLEXITY: Low   GOALS: Goals reviewed with patient? Yes  SHORT TERM GOALS: Target date: 02/11/2023   Will be compliant with appropriate progressive HEP Baseline: Goal status: INITIAL  2.  Pain to have been no more than 2/10 at worst  Baseline:  Goal status: INITIAL  3.  L knee AROM to be equal to that of the right  Baseline:  Goal status: INITIAL  4.  Will be able to hold tandem stance for 10 seconds independently and SLS for 5 seconds independently  Baseline:  Goal  status: INITIAL    LONG TERM GOALS: Target date: 03/11/2023    MMT to improve by at least one grade all weak groups  Baseline:  Goal status: INITIAL  2.  Will be able to hold tandem stance for at least 30 seconds and SLS for at least 10 seconds independently to show reduced fall risk  Baseline:  Goal status: INITIAL  3.  Will be able to navigate stairs and uneven surfaces without increase from resting pain level  Baseline:  Goal status: INITIAL  4.  Will be independent with advanced HEP vs independent gym program to maintain functional gains and prevent return of knee pain  Baseline:  Goal status: INITIAL  5.  FOTO score to be within 5 points of predicted value by time of DC  Baseline:  Goal status: INITIAL     PLAN:  PT FREQUENCY: 1-2x/week  PT DURATION: 8 weeks  PLANNED INTERVENTIONS: Therapeutic exercises, Therapeutic activity, Neuromuscular re-education, Balance training, Gait training, Patient/Family education, Self Care, Joint mobilization, Aquatic Therapy, Dry Needling, Electrical stimulation, Cryotherapy, Moist heat, Taping, Ultrasound, Ionotophoresis 4mg /ml Dexamethasone, Manual therapy, and Re-evaluation  PLAN FOR NEXT SESSION: weekly HEP updates; check squat form and correct as appropriate, general focus on strength, knee ROM, balance, flexibility   Nedra Hai, PT, DPT 01/14/23 1:28 PM

## 2023-01-15 ENCOUNTER — Encounter: Payer: Self-pay | Admitting: Internal Medicine

## 2023-01-20 NOTE — Therapy (Signed)
OUTPATIENT PHYSICAL THERAPY TREATMENT NOTE    Patient Name: Kimberly Butler MRN: [id number] DOB:02/03/1956, 67 y.o., female Today's Date: 01/21/2023  END OF SESSION:  PT End of Session - 01/21/23 0802     Visit Number 2    Number of Visits 17    Date for PT Re-Evaluation 03/11/23    Authorization Type [employer] BIND    Authorization Time Period 01/14/23 to 03/11/23    PT Start Time 0803    PT Stop Time 0843    PT Time Calculation (min) 40 min    Activity Tolerance Patient tolerated treatment well;No increased pain    Behavior During Therapy WFL for tasks assessed/performed              Past Medical History:  Diagnosis Date   Allergy    RHINITIS   Cancer (HCC)    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   Epilepsy (HCC)    hasn't had seizure in 20 years (01/15/17)   GERD (gastroesophageal reflux disease)    Hypertension    Obesity    Psoriasis    Psoriatic arthritis (HCC)    Smoker    Varicose veins    Past Surgical History:  Procedure Laterality Date   CATARACT EXTRACTION W/ INTRAOCULAR LENS IMPLANT Bilateral    CESAREAN SECTION     CHOLECYSTECTOMY N/A 01/23/2017   Procedure: LAPAROSCOPIC CHOLECYSTECTOMY;  Surgeon: Violeta Gelinas, MD;  Location: [facility] OR;  Service: General;  Laterality: N/A;   TONSILECTOMY, ADENOIDECTOMY, BILATERAL MYRINGOTOMY AND TUBES     TUBAL LIGATION     Patient Active Problem List   Diagnosis Date Noted   Family history of breast cancer 07/11/2021   History of COVID-19 07/31/2020   Lactose intolerance in adult 11/18/2016   Post-menopausal 08/28/2016   High risk medication use 08/28/2016   History of vitamin D deficiency 08/28/2016   History of gastroesophageal reflux (GERD) 08/28/2016   History of iron deficiency anemia 08/28/2016   Psoriasis 07/07/2014   Seizure disorder (HCC) 01/07/2011   Hypertension 01/07/2011   Psoriatic arthritis (HCC) 01/07/2011   Allergic rhinitis due to pollen 01/07/2011   Obesity (BMI 30-39.9) 01/07/2011    PCP: Ronnald Nian,  MD  REFERRING PROVIDER: Ronnald Nian, MD  REFERRING DIAG: L40.50 (ICD-10-CM) - Psoriatic arthritis (HCC) M25.562 (ICD-10-CM) - Acute pain of left knee  THERAPY DIAG:  Chronic pain of left knee  Muscle weakness (generalized)  Stiffness of left knee, not elsewhere classified  Rationale for Evaluation and Treatment: Rehabilitation  ONSET DATE: about a month ago   SUBJECTIVE:   Per eval - I feel like my pain's not normal. My knee started hurting about a month ago, I'm the type of person to "walk it out" so I kept going. Then when I took a 4 hour pain trip it started hurting again.Sitting and standing doesn't hurt, stairs can hurt depending on the day. Feels like knee pops, or a pressure or something. Its slowing me down just a bit. We were in [place] and all the roads are cobblestones, it was really hard for me to get around there. It takes me a minute to get going when I first stand up.   SUBJECTIVE STATEMENT: 01/21/2023 no pain at present, describes stiffness. Doing exercises twice a day, feels like stretching is helpful. Feels like her balance is better since she started doing exercises.   PERTINENT HISTORY: Skin cancer, epilepsy, HTN, obesity, psoriatic arthritis, current smoker PAIN:  Are you having pain? No 0/10 now but "  doesn't feel normal"; when it really hurts "I just take 2 tylenol and roll through"   PRECAUTIONS: Other: hx of epilepsy, no recent seizures   RED FLAGS: None   WEIGHT BEARING RESTRICTIONS: No  FALLS:  Has patient fallen in last 6 months? 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[place]   PLOF: Independent, Independent with basic ADLs, Independent with gait, and Independent with transfers  PATIENT GOALS: stop knee pain, learn what I can do to manage my knee and the muscles strong   NEXT MD VISIT:  Referring PRN   OBJECTIVE: (objective measures completed at initial evaluation unless otherwise dated)     PATIENT SURVEYS:  FOTO 53, predicted 75 in 8 visits   COGNITION: Overall cognitive status: Within functional limits for tasks assessed     SENSATION: Not tested    MUSCLE LENGTH: Hamstrings tight by observation    PALPATION: No areas TTP   LOWER EXTREMITY ROM:  Active ROM Right eval Left eval  Hip flexion    Hip extension    Hip abduction    Hip adduction    Hip internal rotation    Hip external rotation    Knee flexion -5* -10*  Knee extension 127* 106*  Ankle dorsiflexion    Ankle plantarflexion    Ankle inversion    Ankle eversion     (Blank rows = not tested)  LOWER EXTREMITY MMT:  MMT Right eval Left eval  Hip flexion 3 3  Hip extension    Hip abduction 3+ 3  Hip adduction    Hip internal rotation    Hip external rotation    Knee flexion 4 3+  Knee extension 4 3+  Ankle dorsiflexion 4+ 4+  Ankle plantarflexion    Ankle inversion    Ankle eversion     (Blank rows = not tested)    FUNCTIONAL TESTS:  Timed up and go (TUG): 12 seconds no device; not able to hold tandem stance for more than 5-10 seconds with MinA, unable to hold SLS without MinA from PT     TODAY'S TREATMENT:                                                                                                                              [facility] Adult PT Treatment:                                                DATE: 01/21/23 Therapeutic Exercise: Bridge with red band x10 cues for breath control Hooklying hip flexion red band x8 BIL  Sidelying hip abduction red band at knees, x10 BIL, cues for hip positioning LAQ x10 red band  Mini squat at counter 2x5 cues for form and posture, comfortable ROM  HEP update + education  Therapeutic Activity: Education/discussion re: symptom behavior, walking throughout day, pacing of activities and activity modification as  indicated     DATE:   Eval  Objective measures, care planning, appropriate education   TherEx  Bridges + ABD into red TB x5 Sidelying hip ABD red TB x5 LAQs red TB x5 HS stretch L LE 1x30 seconds     PATIENT EDUCATION:  Education details: rationale for interventions Person educated: Patient Education method: Explanation, Demonstration, and Handouts Education comprehension: verbalized understanding, returned demonstration, and needs further education  HOME EXERCISE PROGRAM: Access Code: [email or web address] URL: [email or web address] Date: 01/21/2023 Prepared by: Fransisco Hertz  Exercises - Supine Bridge with Resistance Band  - 1-2 x daily - 7 x weekly - 1 sets - 10 reps - 1-2 seconds hold - Sidelying Hip Abduction with Resistance at Thighs  - 1-2 x daily - 7 x weekly - 1 sets - 10 reps - 1 second hold - Sitting Knee Extension with Resistance  - 1-2 x daily - 7 x weekly - 1 sets - 10 reps - 2 seconds  hold - Seated Hamstring Stretch  - 1-2 x daily - 7 x weekly - 1 sets - 3 reps - 30 seconds  hold - Supine March with Resistance Band  - 1-2 x daily - 7 x weekly - 1 sets - 8 reps - Mini Squat with Counter Support  - 1-2 x daily - 7 x weekly - 1 sets - 5 reps  ASSESSMENT:  CLINICAL IMPRESSION: 01/21/2023 Pt arrives w/o pain, reports good HEP adherence with noted benefit in balance/walking tolerance. Today focusing on HEP review and expansion of program to work on hip/knee strength within pt tolerance. No adverse events, reports improved symptoms on departure, cues as above. Recommend continuing along current POC in order to address relevant deficits and improve functional tolerance. Pt departs today's session in no acute distress, all voiced questions/concerns addressed appropriately from PT perspective.    Per eval - Patient is a 67 y.o. F who was seen today for physical therapy evaluation and treatment for L knee pain with history of psoriatic OA. Exam with objective  findings as above. 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    OBJECTIVE IMPAIRMENTS: Abnormal gait, decreased activity tolerance, decreased balance, decreased mobility, difficulty walking, decreased ROM, decreased strength, hypomobility, impaired flexibility, obesity, and pain.   ACTIVITY LIMITATIONS: squatting, stairs, transfers, and locomotion level  PARTICIPATION LIMITATIONS: driving, shopping, community activity, occupation, and yard work  PERSONAL FACTORS: Age, Behavior pattern, Education, Fitness, Past/current experiences, Social background, and Time since onset of injury/illness/exacerbation are also affecting patient's functional outcome.   REHAB POTENTIAL: Good  CLINICAL DECISION MAKING: Stable/uncomplicated  EVALUATION COMPLEXITY: Low   GOALS: Goals reviewed with patient? Yes  SHORT TERM GOALS: Target date: 02/11/2023   Will be compliant with appropriate progressive HEP Baseline: Goal status: INITIAL  2.  Pain to have been no more than 2/10 at worst  Baseline:  Goal status: INITIAL  3.  L knee AROM to be equal to that of the right  Baseline:  Goal status: INITIAL  4.  Will be able to hold tandem stance for 10 seconds independently and SLS for 5 seconds independently  Baseline:  Goal status: INITIAL  LONG TERM GOALS: Target date: 03/11/2023    MMT to improve by at least one grade all weak groups  Baseline:  Goal status: INITIAL  2.  Will be able to hold tandem stance for at least 30 seconds and SLS for at least 10 seconds independently to show reduced fall risk  Baseline:  Goal status: INITIAL  3.  Will be able to navigate stairs and uneven surfaces without increase from resting pain level  Baseline:  Goal status: INITIAL  4.  Will be independent with advanced HEP vs independent gym program to  maintain functional gains and prevent return of knee pain  Baseline:  Goal status: INITIAL  5.  FOTO score to be within 5 points of predicted value by time of DC  Baseline:  Goal status: INITIAL     PLAN:  PT FREQUENCY: 1-2x/week  PT DURATION: 8 weeks  PLANNED INTERVENTIONS: Therapeutic exercises, Therapeutic activity, Neuromuscular re-education, Balance training, Gait training, Patient/Family education, Self Care, Joint mobilization, Aquatic Therapy, Dry Needling, Electrical stimulation, Cryotherapy, Moist heat, Taping, Ultrasound, Ionotophoresis 4mg /ml Dexamethasone, Manual therapy, and Re-evaluation  PLAN FOR NEXT SESSION: weekly HEP updates; squatting/hinging practice to work on Nature conservation officer, general focus on strength, knee ROM, balance, flexibility   Ashley Murrain PT, DPT 01/21/2023 11:14 AM

## 2023-01-21 ENCOUNTER — Encounter: Payer: Self-pay | Admitting: Physical Therapy

## 2023-01-21 ENCOUNTER — Ambulatory Visit: Payer: No Typology Code available for payment source | Admitting: Physical Therapy

## 2023-01-21 DIAGNOSIS — G8929 Other chronic pain: Secondary | ICD-10-CM

## 2023-01-21 DIAGNOSIS — M25662 Stiffness of left knee, not elsewhere classified: Secondary | ICD-10-CM | POA: Diagnosis not present

## 2023-01-21 DIAGNOSIS — M25562 Pain in left knee: Secondary | ICD-10-CM

## 2023-01-21 DIAGNOSIS — M6281 Muscle weakness (generalized): Secondary | ICD-10-CM | POA: Diagnosis not present

## 2023-01-23 ENCOUNTER — Telehealth: Payer: Self-pay | Admitting: Family Medicine

## 2023-01-23 NOTE — Telephone Encounter (Signed)
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 She asks if you can write her out of work for a couple of weeks because she says the PT is working but then when she goes to work and does all that walking she feels like it is destroying her progress she is making at PT. 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 If you dont feel that it is necessary to write her out she at least ask to write a letter stating she needs to decrease walking due to her condition. She says it can be emailed to her

## 2023-01-23 NOTE — Telephone Encounter (Signed)
Can you help with this since Vernona Rieger is out?

## 2023-01-27 ENCOUNTER — Encounter: Payer: Self-pay | Admitting: Family Medicine

## 2023-01-27 NOTE — Telephone Encounter (Signed)
Letter sent.

## 2023-01-28 ENCOUNTER — Telehealth: Payer: Self-pay | Admitting: *Deleted

## 2023-01-28 ENCOUNTER — Ambulatory Visit: Payer: No Typology Code available for payment source | Admitting: Rehabilitative and Restorative Service Providers"

## 2023-01-28 ENCOUNTER — Telehealth: Payer: Self-pay

## 2023-01-28 ENCOUNTER — Encounter: Payer: Self-pay | Admitting: Rehabilitative and Restorative Service Providers"

## 2023-01-28 DIAGNOSIS — G8929 Other chronic pain: Secondary | ICD-10-CM

## 2023-01-28 DIAGNOSIS — M25562 Pain in left knee: Secondary | ICD-10-CM

## 2023-01-28 DIAGNOSIS — M6281 Muscle weakness (generalized): Secondary | ICD-10-CM

## 2023-01-28 DIAGNOSIS — M25662 Stiffness of left knee, not elsewhere classified: Secondary | ICD-10-CM

## 2023-01-28 DIAGNOSIS — R2681 Unsteadiness on feet: Secondary | ICD-10-CM

## 2023-01-28 NOTE — Telephone Encounter (Signed)
Pt left a voicemail stating her insurance starts over on 9/1. Insurance is requesting PA for enbrel.

## 2023-01-28 NOTE — Therapy (Signed)
OUTPATIENT PHYSICAL THERAPY TREATMENT NOTE    Patient Name: Kimberly Butler MRN: [id number] DOB:Jan 08, 1956, 67 y.o., female Today's Date: 01/28/2023  END OF SESSION:  PT End of Session - 01/28/23 1056     Visit Number 3    Number of Visits 17    Date for PT Re-Evaluation 03/11/23    Authorization Type [employer] BIND    Authorization Time Period 01/14/23 to 03/11/23    PT Start Time 1055    PT Stop Time 1141    PT Time Calculation (min) 46 min    Activity Tolerance Patient tolerated treatment well;No increased pain    Behavior During Therapy WFL for tasks assessed/performed               Past Medical History:  Diagnosis Date   Allergy    RHINITIS   Cancer (HCC)    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   Epilepsy (HCC)    hasn't had seizure in 20 years (01/15/17)   GERD (gastroesophageal reflux disease)    Hypertension    Obesity    Psoriasis    Psoriatic arthritis (HCC)    Smoker    Varicose veins    Past Surgical History:  Procedure Laterality Date   CATARACT EXTRACTION W/ INTRAOCULAR LENS IMPLANT Bilateral    CESAREAN SECTION     CHOLECYSTECTOMY N/A 01/23/2017   Procedure: LAPAROSCOPIC CHOLECYSTECTOMY;  Surgeon: Violeta Gelinas, MD;  Location: [facility] OR;  Service: General;  Laterality: N/A;   TONSILECTOMY, ADENOIDECTOMY, BILATERAL MYRINGOTOMY AND TUBES     TUBAL LIGATION     Patient Active Problem List   Diagnosis Date Noted   Family history of breast cancer 07/11/2021   History of COVID-19 07/31/2020   Lactose intolerance in adult 11/18/2016   Post-menopausal 08/28/2016   High risk medication use 08/28/2016   History of vitamin D deficiency 08/28/2016   History of gastroesophageal reflux (GERD) 08/28/2016   History of iron deficiency anemia 08/28/2016   Psoriasis 07/07/2014   Seizure disorder (HCC) 01/07/2011   Hypertension 01/07/2011   Psoriatic arthritis (HCC) 01/07/2011   Allergic rhinitis due to pollen 01/07/2011   Obesity (BMI 30-39.9) 01/07/2011    PCP: Ronnald Nian,  MD  REFERRING PROVIDER: Ronnald Nian, MD  REFERRING DIAG: L40.50 (ICD-10-CM) - Psoriatic arthritis (HCC) M25.562 (ICD-10-CM) - Acute pain of left knee  THERAPY DIAG:  Chronic pain of left knee  Muscle weakness (generalized)  Stiffness of left knee, not elsewhere classified  Unsteadiness on feet  Rationale for Evaluation and Treatment: Rehabilitation  ONSET DATE: about a month ago   SUBJECTIVE:   Per eval - I feel like my pain's not normal. My knee started hurting about a month ago, I'm the type of person to "walk it out" so I kept going. Then when I took a 4 hour pain trip it started hurting again.Sitting and standing doesn't hurt, stairs can hurt depending on the day. Feels like knee pops, or a pressure or something. Its slowing me down just a bit. We were in [place] and all the roads are cobblestones, it was really hard for me to get around there. It takes me a minute to get going when I first stand up.   SUBJECTIVE STATEMENT: 01/28/2023 Kimberly Butler notes excellent compliance with her HEP, although she may be doing too much walking for exercises and standing at work is very uncomfortable.  PERTINENT HISTORY: Skin cancer, epilepsy, HTN, obesity, psoriatic arthritis, current smoker PAIN:  Are you having pain? No 0/10 now  but "doesn't feel normal"; when it really hurts "I just take 2 tylenol and roll through"   PRECAUTIONS: Other: hx of epilepsy, no recent seizures   RED FLAGS: None   WEIGHT BEARING RESTRICTIONS: No  FALLS:  Has patient fallen in last 6 months? 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[place]   PLOF: Independent, Independent with basic ADLs, Independent with gait, and Independent with transfers  PATIENT GOALS: stop knee pain, learn what I can do to manage my knee and the muscles strong   NEXT MD VISIT:  Referring PRN   OBJECTIVE: (objective measures completed at initial evaluation unless otherwise dated)     PATIENT SURVEYS:  FOTO 53, predicted 75 in 8 visits   COGNITION: Overall cognitive status: Within functional limits for tasks assessed     SENSATION: Not tested    MUSCLE LENGTH: Hamstrings tight by observation    PALPATION: No areas TTP   LOWER EXTREMITY ROM:  Active ROM Right eval Left eval  Hip flexion    Hip extension    Hip abduction    Hip adduction    Hip internal rotation    Hip external rotation    Knee flexion -5* -10*  Knee extension 127* 106*  Ankle dorsiflexion    Ankle plantarflexion    Ankle inversion    Ankle eversion     (Blank rows = not tested)  LOWER EXTREMITY MMT:  MMT Right eval Left eval  Hip flexion 3 3  Hip extension    Hip abduction 3+ 3  Hip adduction    Hip internal rotation    Hip external rotation    Knee flexion 4 3+  Knee extension 4 3+  Ankle dorsiflexion 4+ 4+  Ankle plantarflexion    Ankle inversion    Ankle eversion     (Blank rows = not tested)    FUNCTIONAL TESTS:  Timed up and go (TUG): 12 seconds no device; not able to hold tandem stance for more than 5-10 seconds with MinA, unable to hold SLS without MinA from PT     TODAY'S TREATMENT:                                                                                                                              [facility] Adult PT Treatment:                                                DATE: 01/28/2023 Recumbent bike Seat 5 for 8 minutes Level 3-7 Seated straight leg raises 3 sets of 5 for 3 seconds Sidelying hip abduction black band above knees, x10 BIL, cues for hip positioning Yoga Bridge 10  x 5 seconds, slow eccentrics  Functional Activities: Double Leg Press 75# 15 reps slow eccentrics Single Leg Press 37# 10 reps slow eccentrics   01/21/23 Therapeutic Exercise: Bridge with red band x10 cues for breath control Hooklying hip flexion red band  x8 BIL  Sidelying hip abduction red band at knees, x10 BIL, cues for hip positioning LAQ x10 red band  Mini squat at counter 2x5 cues for form and posture, comfortable ROM  HEP update + education  Therapeutic Activity: Education/discussion re: symptom behavior, walking throughout day, pacing of activities and activity modification as indicated   Eval  Objective measures, care planning, appropriate education   TherEx  Bridges + ABD into red TB x5 Sidelying hip ABD red TB x5 LAQs red TB x5 HS stretch L LE 1x30 seconds     PATIENT EDUCATION:  Education details: rationale for interventions Person educated: Patient Education method: Explanation, Demonstration, and Handouts Education comprehension: verbalized understanding, returned demonstration, and needs further education  HOME EXERCISE PROGRAM: Access Code: [email or web address] URL: [email or web address] Date: 01/21/2023 Prepared by: Fransisco Hertz  Exercises - Supine Bridge with Resistance Band  - 1-2 x daily - 7 x weekly - 1 sets - 10 reps - 1-2 seconds hold - Sidelying Hip Abduction with Resistance at Thighs  - 1-2 x daily - 7 x weekly - 1 sets - 10 reps - 1 second hold - Sitting Knee Extension with Resistance  - 1-2 x daily - 7 x weekly - 1 sets - 10 reps - 2 seconds  hold - Seated Hamstring Stretch  - 1-2 x daily - 7 x weekly - 1 sets - 3 reps - 30 seconds  hold - Supine March with Resistance Band  - 1-2 x daily - 7 x weekly - 1 sets - 8 reps - Mini Squat with Counter Support  - 1-2 x daily - 7 x weekly - 1 sets - 5 reps  ASSESSMENT:  CLINICAL IMPRESSION: 01/28/2023 Kimberly Butler is not going to have a problem with non-compliance.  She may have problems with overuse/over doing things.  She is very compliant with her HEP but may be doing too much walking and prolonged standing/WB at work, causing unnecessary knee pain.  I encouraged her to focus on non weight-bearing activities like seated straight leg raises, hip abduction and  partial weight-bearing activities like bridging and the stationary bike to avoid overuse.  With continued quadriceps strength emphasis and avoiding overuse, her prognosis to meet long-term goals remains good.  Per eval - Patient is a 67 y.o. F who was seen today for physical therapy evaluation and treatment for L knee pain with history of psoriatic OA. Exam with objective findings as above. 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    OBJECTIVE IMPAIRMENTS: Abnormal gait, decreased activity tolerance, decreased balance, decreased mobility, difficulty walking, decreased ROM, decreased strength, hypomobility, impaired flexibility, obesity, and pain.   ACTIVITY LIMITATIONS: squatting, stairs, transfers, and locomotion level  PARTICIPATION LIMITATIONS: driving, shopping, community activity, occupation, and yard work  PERSONAL FACTORS: Age, Behavior pattern, Education, Fitness, Past/current experiences, Social background, and Time since onset of injury/illness/exacerbation are also affecting patient's functional outcome.   REHAB POTENTIAL: Good  CLINICAL DECISION MAKING: Stable/uncomplicated  EVALUATION COMPLEXITY: Low   GOALS: Goals reviewed with patient? Yes  SHORT TERM GOALS: Target date: 02/11/2023   Will be compliant with appropriate  progressive HEP Baseline: Goal status: MET 01/28/2023  2.  Pain to have been no more than 2/10 at worst  Baseline:  Goal status: INITIAL  3.  L knee AROM to be equal to that of the right  Baseline:  Goal status: INITIAL  4.  Will be able to hold tandem stance for 10 seconds independently and SLS for 5 seconds independently  Baseline:  Goal status: INITIAL    LONG TERM GOALS: Target date: 03/11/2023    MMT to improve by at least one grade all weak groups  Baseline:  Goal status: INITIAL  2.   Will be able to hold tandem stance for at least 30 seconds and SLS for at least 10 seconds independently to show reduced fall risk  Baseline:  Goal status: INITIAL  3.  Will be able to navigate stairs and uneven surfaces without increase from resting pain level  Baseline:  Goal status: INITIAL  4.  Will be independent with advanced HEP vs independent gym program to maintain functional gains and prevent return of knee pain  Baseline:  Goal status: INITIAL  5.  FOTO score to be within 5 points of predicted value by time of DC  Baseline:  Goal status: INITIAL     PLAN:  PT FREQUENCY: 1-2x/week  PT DURATION: 8 weeks  PLANNED INTERVENTIONS: Therapeutic exercises, Therapeutic activity, Neuromuscular re-education, Balance training, Gait training, Patient/Family education, Self Care, Joint mobilization, Aquatic Therapy, Dry Needling, Electrical stimulation, Cryotherapy, Moist heat, Taping, Ultrasound, Ionotophoresis 4mg /ml Dexamethasone, Manual therapy, and Re-evaluation  PLAN FOR NEXT SESSION: weekly HEP updates; Focus on quadriceps strength, knee ROM, balance, flexibility   Cherlyn Cushing PT, MPT 01/28/2023 11:53 AM

## 2023-01-28 NOTE — Telephone Encounter (Signed)
Patient contacted the office stating that her insurance will be making a change as far as medications to [facility]. Patient states she received a letter that she will need a new PA for her Enbrel Sureclick. Patient states this change is happening 02/09/2023. Patient is contacting her insurance to see if her RXBIN,PCN and Group number will change. If it is changing then she will call the office to provide updated information.

## 2023-01-29 NOTE — Telephone Encounter (Signed)
Gearldine Bienenstock, PA-C  Prescribes that medication.  Sent my chart message to contact that office.

## 2023-02-03 ENCOUNTER — Encounter: Payer: No Typology Code available for payment source | Admitting: Physical Therapy

## 2023-02-04 ENCOUNTER — Encounter: Payer: Self-pay | Admitting: Physical Therapy

## 2023-02-04 ENCOUNTER — Ambulatory Visit: Payer: No Typology Code available for payment source | Admitting: Physical Therapy

## 2023-02-04 DIAGNOSIS — G8929 Other chronic pain: Secondary | ICD-10-CM

## 2023-02-04 DIAGNOSIS — M25662 Stiffness of left knee, not elsewhere classified: Secondary | ICD-10-CM | POA: Diagnosis not present

## 2023-02-04 DIAGNOSIS — M6281 Muscle weakness (generalized): Secondary | ICD-10-CM

## 2023-02-04 DIAGNOSIS — M25562 Pain in left knee: Secondary | ICD-10-CM | POA: Diagnosis not present

## 2023-02-04 DIAGNOSIS — R2681 Unsteadiness on feet: Secondary | ICD-10-CM

## 2023-02-04 NOTE — Therapy (Signed)
OUTPATIENT PHYSICAL THERAPY TREATMENT NOTE    Patient Name: Kimberly Butler MRN: [id number] DOB:[birth year], 67 y.o., female Today's Date: 02/04/2023  END OF SESSION:  PT End of Session - 02/04/23 0936     Visit Number 4    Number of Visits 17    Date for PT Re-Evaluation 03/11/23    Authorization Type [employer] BIND    Authorization Time Period 01/14/23 to 03/11/23    PT Start Time 0932    PT Stop Time 1015    PT Time Calculation (min) 43 min    Activity Tolerance Patient tolerated treatment well;No increased pain    Behavior During Therapy WFL for tasks assessed/performed                Past Medical History:  Diagnosis Date   Allergy    RHINITIS   Cancer (HCC)    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   Epilepsy (HCC)    hasn't had seizure in 20 years (01/15/17)   GERD (gastroesophageal reflux disease)    Hypertension    Obesity    Psoriasis    Psoriatic arthritis (HCC)    Smoker    Varicose veins    Past Surgical History:  Procedure Laterality Date   CATARACT EXTRACTION W/ INTRAOCULAR LENS IMPLANT Bilateral    CESAREAN SECTION     CHOLECYSTECTOMY N/A 01/23/2017   Procedure: LAPAROSCOPIC CHOLECYSTECTOMY;  Surgeon: Violeta Gelinas, MD;  Location: [facility] OR;  Service: General;  Laterality: N/A;   TONSILECTOMY, ADENOIDECTOMY, BILATERAL MYRINGOTOMY AND TUBES     TUBAL LIGATION     Patient Active Problem List   Diagnosis Date Noted   Family history of breast cancer 07/11/2021   History of COVID-19 07/31/2020   Lactose intolerance in adult 11/18/2016   Post-menopausal 08/28/2016   High risk medication use 08/28/2016   History of vitamin D deficiency 08/28/2016   History of gastroesophageal reflux (GERD) 08/28/2016   History of iron deficiency anemia 08/28/2016   Psoriasis 07/07/2014   Seizure disorder (HCC) 01/07/2011   Hypertension 01/07/2011   Psoriatic arthritis (HCC) 01/07/2011   Allergic rhinitis due to pollen 01/07/2011   Obesity (BMI 30-39.9) 01/07/2011    PCP: Ronnald Nian, MD  REFERRING PROVIDER: Ronnald Nian, MD  REFERRING DIAG: L40.50 (ICD-10-CM) - Psoriatic arthritis (HCC) M25.562 (ICD-10-CM) - Acute pain of left knee  THERAPY DIAG:  Chronic pain of left knee  Muscle weakness (generalized)  Stiffness of left knee, not elsewhere classified  Unsteadiness on feet  Rationale for Evaluation and Treatment: Rehabilitation  ONSET DATE: about a month ago   SUBJECTIVE:   Per eval - I feel like my pain's not normal. My knee started hurting about a month ago, I'm the type of person to "walk it out" so I kept going. Then when I took a 4 hour pain trip it started hurting again.Sitting and standing doesn't hurt, stairs can hurt depending on the day. Feels like knee pops, or a pressure or something. Its slowing me down just a bit. We were in [place] and all the roads are cobblestones, it was really hard for me to get around there. It takes me a minute to get going when I first stand up.   SUBJECTIVE STATEMENT: 02/04/2023  She is going to gym everyday riding bike for 1 hour and leg press machine double leg & single leg. She is taking Aleve for pain and not using cane.    PERTINENT HISTORY: Skin cancer, epilepsy, HTN, obesity, psoriatic arthritis, current  smoker  PAIN:  Are you having pain? No 0/10 now but "doesn't feel normal"; when it really hurts "I just take 2 tylenol and roll through"   PRECAUTIONS: Other: hx of epilepsy, no recent seizures   RED FLAGS: None   WEIGHT BEARING RESTRICTIONS: No  FALLS:  Has patient fallen in last 6 months? 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[place]   PLOF: Independent, Independent with basic ADLs, Independent with gait, and Independent with transfers  PATIENT GOALS: stop knee pain, learn what I can do to manage my knee and the muscles strong   NEXT MD  VISIT: Referring PRN   OBJECTIVE: (objective measures completed at initial evaluation unless otherwise dated)   PATIENT SURVEYS:  FOTO 53, predicted 75 in 8 visits   COGNITION: Overall cognitive status: Within functional limits for tasks assessed     SENSATION: Not tested  MUSCLE LENGTH: Hamstrings tight by observation  PALPATION: No areas TTP   LOWER EXTREMITY ROM:  Active ROM Right eval Left eval  Hip flexion    Hip extension    Hip abduction    Hip adduction    Hip internal rotation    Hip external rotation    Knee flexion -5* -10*  Knee extension 127* 106*  Ankle dorsiflexion    Ankle plantarflexion    Ankle inversion    Ankle eversion     (Blank rows = not tested)  LOWER EXTREMITY MMT:  MMT Right eval Left eval  Hip flexion 3 3  Hip extension    Hip abduction 3+ 3  Hip adduction    Hip internal rotation    Hip external rotation    Knee flexion 4 3+  Knee extension 4 3+  Ankle dorsiflexion 4+ 4+  Ankle plantarflexion    Ankle inversion    Ankle eversion     (Blank rows = not tested)    FUNCTIONAL TESTS:  Timed up and go (TUG): 12 seconds no device; not able to hold tandem stance for more than 5-10 seconds with MinA, unable to hold SLS without MinA from PT     TODAY'S TREATMENT:                                                                                        DATE: 02/04/2023: Therapeutic Exercise:  Recumbent Bike seat 5 level 3 for 1st min, then level 8 for 7 min.   Leg press BLEs 87# 15 reps and single leg 43# 15 reps 2 sets with BLEs. PT verbal cues on technique.  Seated heel slides alternating LEs as exercise to minimize stiffness with prolonged sitting. Pt verbalized and return demo understanding. Seated ankle alphabet as exercise to minimize stiffness with prolonged sitting. Pt verbalized and return demo understanding. Gastroc stretch step heel depression 30 sec hold 2 reps  Quad stretch standing knee flexion 30 sec hold 2 reps  PT  added above 5 exercises to HEP with demo, verbal & HO cues including rationale. Pt verbalized and return  demo understanding.    TREATMENT:                                                                                                                              [facility] Adult PT Treatment:                                                DATE: 01/28/2023 Recumbent bike Seat 5 for 8 minutes Level 3-7 Seated straight leg raises 3 sets of 5 for 3 seconds Sidelying hip abduction black band above knees, x10 BIL, cues for hip positioning Yoga Bridge 10 x 5 seconds, slow eccentrics  Functional Activities: Double Leg Press 75# 15 reps slow eccentrics Single Leg Press 37# 10 reps slow eccentrics   01/21/23 Therapeutic Exercise: Bridge with red band x10 cues for breath control Hooklying hip flexion red band x8 BIL  Sidelying hip abduction red band at knees, x10 BIL, cues for hip positioning LAQ x10 red band  Mini squat at counter 2x5 cues for form and posture, comfortable ROM  HEP update + education  Therapeutic Activity: Education/discussion re: symptom behavior, walking throughout day, pacing of activities and activity modification as indicated   Eval  Objective measures, care planning, appropriate education   TherEx  Bridges + ABD into red TB x5 Sidelying hip ABD red TB x5 LAQs red TB x5 HS stretch L LE 1x30 seconds     PATIENT EDUCATION:  Education details: rationale for interventions Person educated: Patient Education method: Explanation, Demonstration, and Handouts Education comprehension: verbalized understanding, returned demonstration, and needs further education  HOME EXERCISE PROGRAM: Access Code: [email or web address] URL: [email or web address] Date: 02/04/2023 Prepared by: Vladimir Faster  Exercises - Supine Bridge with Resistance Band  - 1-2 x daily - 7 x weekly - 1 sets - 10 reps - 1-2 seconds hold - Sidelying Hip Abduction with Resistance at Thighs  - 1-2 x  daily - 7 x weekly - 1 sets - 10 reps - 1 second hold - Sitting Knee Extension with Resistance  - 1-2 x daily - 7 x weekly - 1 sets - 10 reps - 2 seconds  hold - Seated Hamstring Stretch  - 1-2 x daily - 7 x weekly - 1 sets - 3 reps - 30 seconds  hold - Supine March with Resistance Band  - 1-2 x daily - 7 x weekly - 1 sets - 8 reps - Mini Squat with Counter Support  - 1-2 x daily - 7 x weekly - 1 sets - 5 reps - Small Range Straight Leg Raise  - 2 x daily - 7 x weekly - 3-5 sets - 5 reps - 3 seconds hold - Gastroc Stretch on Step  - 1 x daily - 7 x weekly - 1 sets - 2-3 reps - 20-30 seconds hold - Standing  Hamstring Stretch with Step  - 1 x daily - 7 x weekly - 1 sets - 2-3 reps - 20-30 seconds hold - Standing Quadriceps Stretch  - 1 x daily - 7 x weekly - 1 sets - 2-3 reps - 20-30 seconds hold - Seated Knee Flexion Extension AROM   - 1 x daily - 5-7 x weekly - 2-3 sets - 10 reps - 2-5 seconds hold - Seated Ankle Alphabet  - 1 x daily - 5-7 x weekly - 1 sets - 1 reps  ASSESSMENT:  CLINICAL IMPRESSION: 02/04/2023 PT educated pt in progressing endurance with walking & bike to minimize large jumps which she verbalized understanding including rationale.  PT added stretches to HEP and some seated exercises for prolonged sitting like on an airplane.  She appears to understand.    Per eval - Patient is a 67 y.o. F who was seen today for physical therapy evaluation and treatment for L knee pain with history of psoriatic OA. Exam with objective findings as above. 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    OBJECTIVE IMPAIRMENTS: Abnormal gait, decreased activity tolerance, decreased balance, decreased mobility, difficulty walking, decreased ROM, decreased strength, hypomobility, impaired flexibility, obesity, and pain.   ACTIVITY LIMITATIONS:  squatting, stairs, transfers, and locomotion level  PARTICIPATION LIMITATIONS: driving, shopping, community activity, occupation, and yard work  PERSONAL FACTORS: Age, Behavior pattern, Education, Fitness, Past/current experiences, Social background, and Time since onset of injury/illness/exacerbation are also affecting patient's functional outcome.   REHAB POTENTIAL: Good  CLINICAL DECISION MAKING: Stable/uncomplicated  EVALUATION COMPLEXITY: Low   GOALS: Goals reviewed with patient? Yes  SHORT TERM GOALS: Target date: 02/11/2023   Will be compliant with appropriate progressive HEP Baseline: Goal status: MET 01/28/2023  2.  Pain to have been no more than 2/10 at worst  Baseline:  Goal status: Ongoing 02/04/2023  3.  L knee AROM to be equal to that of the right  Baseline:  Goal status: Ongoing 02/04/2023  4.  Will be able to hold tandem stance for 10 seconds independently and SLS for 5 seconds independently  Baseline:  Goal status: Ongoing 02/04/2023    LONG TERM GOALS: Target date: 03/11/2023    MMT to improve by at least one grade all weak groups  Baseline:  Goal status: Ongoing 02/04/2023  2.  Will be able to hold tandem stance for at least 30 seconds and SLS for at least 10 seconds independently to show reduced fall risk  Baseline:  Goal status: Ongoing 02/04/2023  3.  Will be able to navigate stairs and uneven surfaces without increase from resting pain level  Baseline:  Goal status: Ongoing 02/04/2023  4.  Will be independent with advanced HEP vs independent gym program to maintain functional gains and prevent return of knee pain  Baseline:  Goal status: Ongoing 02/04/2023  5.  FOTO score to be within 5 points of predicted value by time of DC  Baseline:  Goal status: Ongoing 02/04/2023     PLAN:  PT FREQUENCY: 1-2x/week  PT DURATION: 8 weeks  PLANNED INTERVENTIONS: Therapeutic exercises, Therapeutic activity, Neuromuscular re-education, Balance training,  Gait training, Patient/Family education, Self Care, Joint mobilization, Aquatic Therapy, Dry Needling, Electrical stimulation, Cryotherapy, Moist heat, Taping, Ultrasound, Ionotophoresis 4mg /ml Dexamethasone, Manual therapy, and Re-evaluation  PLAN FOR NEXT SESSION:  weekly HEP updates; add some balance activities  to HEP.  Focus on quadriceps strength, knee ROM, balance, flexibility     Vladimir Faster, PT, DPT 02/04/2023, 1:07 PM

## 2023-02-12 ENCOUNTER — Encounter: Payer: Self-pay | Admitting: Rehabilitative and Restorative Service Providers"

## 2023-02-12 ENCOUNTER — Ambulatory Visit: Payer: No Typology Code available for payment source | Admitting: Rehabilitative and Restorative Service Providers"

## 2023-02-12 DIAGNOSIS — M25562 Pain in left knee: Secondary | ICD-10-CM

## 2023-02-12 DIAGNOSIS — R2681 Unsteadiness on feet: Secondary | ICD-10-CM

## 2023-02-12 DIAGNOSIS — M6281 Muscle weakness (generalized): Secondary | ICD-10-CM | POA: Diagnosis not present

## 2023-02-12 DIAGNOSIS — M25662 Stiffness of left knee, not elsewhere classified: Secondary | ICD-10-CM | POA: Diagnosis not present

## 2023-02-12 DIAGNOSIS — G8929 Other chronic pain: Secondary | ICD-10-CM

## 2023-02-12 NOTE — Therapy (Signed)
OUTPATIENT PHYSICAL THERAPY TREATMENT/PROGRESS NOTE   Progress Note Reporting Period 01/14/2023 to 02/12/2023  See note below for Objective Data and Assessment of Progress/Goals.     Patient Name: Kimberly Butler MRN: [id number] DOB:[birth year], 67 y.o., female Today's Date: 02/12/2023  END OF SESSION:  PT End of Session - 02/12/23 1257     Visit Number 5    Number of Visits 17    Date for PT Re-Evaluation 03/11/23    Authorization Type [employer] BIND    Authorization Time Period 01/14/23 to 03/11/23    PT Start Time 1257    PT Stop Time 1345    PT Time Calculation (min) 48 min    Activity Tolerance Patient tolerated treatment well;No increased pain    Behavior During Therapy WFL for tasks assessed/performed             Past Medical History:  Diagnosis Date   Allergy    RHINITIS   Cancer (HCC)    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   Epilepsy (HCC)    hasn't had seizure in 20 years (01/15/17)   GERD (gastroesophageal reflux disease)    Hypertension    Obesity    Psoriasis    Psoriatic arthritis (HCC)    Smoker    Varicose veins    Past Surgical History:  Procedure Laterality Date   CATARACT EXTRACTION W/ INTRAOCULAR LENS IMPLANT Bilateral    CESAREAN SECTION     CHOLECYSTECTOMY N/A 01/23/2017   Procedure: LAPAROSCOPIC CHOLECYSTECTOMY;  Surgeon: Violeta Gelinas, MD;  Location: [facility] OR;  Service: General;  Laterality: N/A;   TONSILECTOMY, ADENOIDECTOMY, BILATERAL MYRINGOTOMY AND TUBES     TUBAL LIGATION     Patient Active Problem List   Diagnosis Date Noted   Family history of breast cancer 07/11/2021   History of COVID-19 07/31/2020   Lactose intolerance in adult 11/18/2016   Post-menopausal 08/28/2016   High risk medication use 08/28/2016   History of vitamin D deficiency 08/28/2016   History of gastroesophageal reflux (GERD) 08/28/2016   History of iron deficiency anemia 08/28/2016   Psoriasis 07/07/2014   Seizure disorder (HCC) 01/07/2011   Hypertension 01/07/2011   Psoriatic  arthritis (HCC) 01/07/2011   Allergic rhinitis due to pollen 01/07/2011   Obesity (BMI 30-39.9) 01/07/2011    PCP: Ronnald Nian, MD  REFERRING PROVIDER: Ronnald Nian, MD  REFERRING DIAG: L40.50 (ICD-10-CM) - Psoriatic arthritis (HCC) M25.562 (ICD-10-CM) - Acute pain of left knee  THERAPY DIAG:  Chronic pain of left knee  Muscle weakness (generalized)  Stiffness of left knee, not elsewhere classified  Unsteadiness on feet  Rationale for Evaluation and Treatment: Rehabilitation  ONSET DATE: about a month ago   SUBJECTIVE:   Per eval - I feel like my pain's not normal. My knee started hurting about a month ago, I'm the type of person to "walk it out" so I kept going. Then when I took a 4 hour pain trip it started hurting again.Sitting and standing doesn't hurt, stairs can hurt depending on the day. Feels like knee pops, or a pressure or something. Its slowing me down just a bit. We were in [place] and all the roads are cobblestones, it was really hard for me to get around there. It takes me a minute to get going when I first stand up.   SUBJECTIVE STATEMENT: 02/12/2023  Kimberly Butler hasn't taken any Aleve in 3 days.  She is pain-free with functional walking and is biking.  She has not returned to walking  for exercise and reports apprehension with stairs.  PERTINENT HISTORY: Skin cancer, epilepsy, HTN, obesity, psoriatic arthritis, current smoker  PAIN:  Are you having pain? No 0-3/10 this week  PRECAUTIONS: Other: hx of epilepsy, no recent seizures   RED FLAGS: None   WEIGHT BEARING RESTRICTIONS: No  FALLS:  Has patient fallen in last 6 months? 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[place]   PLOF: Independent, Independent with basic ADLs, Independent with gait, and Independent with transfers  PATIENT GOALS: stop  knee pain, learn what I can do to manage my knee and the muscles strong   NEXT MD VISIT: Referring PRN   OBJECTIVE: (objective measures completed at initial evaluation unless otherwise dated)   PATIENT SURVEYS:  02/12/2023   FOTO 51  Eval   FOTO 53, predicted 75 in 8 visits   COGNITION: Overall cognitive status: Within functional limits for tasks assessed     SENSATION: Not tested  MUSCLE LENGTH: Hamstrings tight by observation  PALPATION: No areas TTP   LOWER EXTREMITY ROM:  Active ROM Right eval Left eval 02/12/2023 Left/Right in degrees  Hip flexion     Hip extension     Hip abduction     Hip adduction     Hip internal rotation     Hip external rotation     Knee flexion -5* -10* 128/133  Knee extension 127* 106* Full extension bilateral (working on avoiding hyperextension)  Ankle dorsiflexion     Ankle plantarflexion     Ankle inversion     Ankle eversion      (Blank rows = not tested)  LOWER EXTREMITY MMT:  MMT Right eval Left eval Left/Right in pounds 02/12/2023  Hip flexion 3 3   Hip extension     Hip abduction 3+ 3   Hip adduction     Hip internal rotation     Hip external rotation     Knee flexion 4 3+   Knee extension 4 3+ 65.8/77.1  Ankle dorsiflexion 4+ 4+   Ankle plantarflexion     Ankle inversion     Ankle eversion      (Blank rows = not tested)    FUNCTIONAL TESTS:  Timed up and go (TUG): 12 seconds no device; not able to hold tandem stance for more than 5-10 seconds with MinA, unable to hold SLS without MinA from PT     TODAY'S TREATMENT:                                                                                        DATE: 02/12/2023 Recumbent bike Seat 5 for 5 minutes Level 3-7 Seated straight leg raises 3 sets of 5 for 3 seconds, last set with 1# Standing quadriceps stretch 1 x 20 seconds bilateral Lower heel cords stretch 1 x 20 seconds bilateral Standing quadriceps stretch 1 x 20 seconds bilateral Seated hamstrings stretch  with slight bend in knee 1 x 20 seconds   Functional Activities: Double Leg Press 100# 15  reps slow eccentrics Single Leg Press 50# 10 reps slow eccentrics Step-up and over 8 inch step slow eccentrics 6 x Functional squat test   02/04/2023: Therapeutic Exercise:  Recumbent Bike seat 5 level 3 for 1st min, then level 8 for 7 min.   Leg press BLEs 87# 15 reps and single leg 43# 15 reps 2 sets with BLEs. PT verbal cues on technique.  Seated heel slides alternating LEs as exercise to minimize stiffness with prolonged sitting. Pt verbalized and return demo understanding. Seated ankle alphabet as exercise to minimize stiffness with prolonged sitting. Pt verbalized and return demo understanding. Gastroc stretch step heel depression 30 sec hold 2 reps  Quad stretch standing knee flexion 30 sec hold 2 reps  PT added above 5 exercises to HEP with demo, verbal & HO cues including rationale. Pt verbalized and return demo understanding.    TREATMENT:                                                                                                                              [facility] Adult PT Treatment:                                                DATE: 01/28/2023 Recumbent bike Seat 5 for 8 minutes Level 3-7 Seated straight leg raises 3 sets of 5 for 3 seconds Sidelying hip abduction black band above knees, x10 BIL, cues for hip positioning Yoga Bridge 10 x 5 seconds, slow eccentrics  Functional Activities: Double Leg Press 75# 15 reps slow eccentrics Single Leg Press 37# 10 reps slow eccentrics   PATIENT EDUCATION:  Education details: rationale for interventions Person educated: Patient Education method: Explanation, Demonstration, and Handouts Education comprehension: verbalized understanding, returned demonstration, and needs further education  HOME EXERCISE PROGRAM: Access Code: [email or web address] URL: [email or web address] Date: 02/04/2023 Prepared by: Vladimir Faster  Exercises -  Supine Bridge with Resistance Band  - 1-2 x daily - 7 x weekly - 1 sets - 10 reps - 1-2 seconds hold - Sidelying Hip Abduction with Resistance at Thighs  - 1-2 x daily - 7 x weekly - 1 sets - 10 reps - 1 second hold - Sitting Knee Extension with Resistance  - 1-2 x daily - 7 x weekly - 1 sets - 10 reps - 2 seconds  hold - Seated Hamstring Stretch  - 1-2 x daily - 7 x weekly - 1 sets - 3 reps - 30 seconds  hold - Supine March with Resistance Band  - 1-2 x daily - 7 x weekly - 1 sets - 8 reps - Mini Squat with Counter Support  - 1-2 x daily - 7 x weekly - 1 sets - 5 reps - Small Range Straight Leg Raise  - 2 x daily - 7 x weekly - 3-5 sets -  5 reps - 3 seconds hold - Gastroc Stretch on Step  - 1 x daily - 7 x weekly - 1 sets - 2-3 reps - 20-30 seconds hold - Standing Hamstring Stretch with Step  - 1 x daily - 7 x weekly - 1 sets - 2-3 reps - 20-30 seconds hold - Standing Quadriceps Stretch  - 1 x daily - 7 x weekly - 1 sets - 2-3 reps - 20-30 seconds hold - Seated Knee Flexion Extension AROM   - 1 x daily - 5-7 x weekly - 2-3 sets - 10 reps - 2-5 seconds hold - Seated Ankle Alphabet  - 1 x daily - 5-7 x weekly - 1 sets - 1 reps  ASSESSMENT:  CLINICAL IMPRESSION: 02/12/2023 Jakeria notes better strength, stability and less pain since starting physical therapy.  She would like to be more confident with stairs and reports 1 episode of her knee "giving way" this week which made her uncomfortable.  She is getting more independent with her home and gym exercises but will benefit from the additional supervised PT.  She should be ready for independent rehabilitation by 03/11/2023.  Per eval - Patient is a 67 y.o. F who was seen today for physical therapy evaluation and treatment for L knee pain with history of psoriatic OA. Exam with objective findings as above. 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    OBJECTIVE IMPAIRMENTS: Abnormal gait, decreased activity tolerance, decreased balance, decreased mobility, difficulty walking, decreased ROM, decreased strength, hypomobility, impaired flexibility, obesity, and pain.   ACTIVITY LIMITATIONS: squatting, stairs, transfers, and locomotion level  PARTICIPATION LIMITATIONS: driving, shopping, community activity, occupation, and yard work  PERSONAL FACTORS: Age, Behavior pattern, Education, Fitness, Past/current experiences, Social background, and Time since onset of injury/illness/exacerbation are also affecting patient's functional outcome.   REHAB POTENTIAL: Good  CLINICAL DECISION MAKING: Stable/uncomplicated  EVALUATION COMPLEXITY: Low   GOALS: Goals reviewed with patient? Yes  SHORT TERM GOALS: Target date: 02/11/2023   Will be compliant with appropriate progressive HEP Baseline: Goal status: MET 01/28/2023  2.  Pain to have been no more than 2/10 at worst  Baseline:  Goal status: Partially Met 02/12/2023  3.  L knee AROM to be equal to that of the right  Baseline:  Goal status: Ongoing 02/12/2023  4.  Will be able to hold tandem stance for 10 seconds independently and SLS for 5 seconds independently  Baseline:  Goal status: Ongoing 02/12/2023    LONG TERM GOALS: Target date: 03/11/2023    MMT to improve by at least one grade all weak groups  Baseline:  Goal status: Ongoing 02/12/2023  2.  Will be able to hold tandem stance for at least 30 seconds and SLS for at least 10 seconds independently to show reduced fall risk  Baseline:  Goal status: Ongoing 02/12/2023  3.  Will be able to navigate stairs and uneven surfaces without increase from resting pain level  Baseline:  Goal status: Partially Met 02/12/2023  4.  Will be independent with advanced HEP vs independent gym program to maintain functional gains and prevent return of knee pain  Baseline:  Goal status: Ongoing  02/12/2023  5.  FOTO score to be within 5 points of predicted value by time of DC  Baseline:  Goal status: Ongoing 02/12/2023     PLAN:  PT  FREQUENCY: 1-2x/week  PT DURATION: 4 weeks  PLANNED INTERVENTIONS: Therapeutic exercises, Therapeutic activity, Neuromuscular re-education, Balance training, Gait training, Patient/Family education, Self Care, Joint mobilization, Aquatic Therapy, Dry Needling, Electrical stimulation, Cryotherapy, Moist heat, Taping, Ultrasound, Ionotophoresis 4mg /ml Dexamethasone, Manual therapy, and Re-evaluation  PLAN FOR NEXT SESSION: Quadriceps strengthening, knee stability, stairs, single-leg balance, increase comfort with home and clinic program.   Cherlyn Cushing, PT, MPT 02/12/2023, 2:02 PM

## 2023-02-14 ENCOUNTER — Telehealth: Payer: Self-pay | Admitting: Pharmacist

## 2023-02-14 ENCOUNTER — Other Ambulatory Visit (HOSPITAL_COMMUNITY): Payer: Self-pay

## 2023-02-14 DIAGNOSIS — L4050 Arthropathic psoriasis, unspecified: Secondary | ICD-10-CM

## 2023-02-14 DIAGNOSIS — Z79899 Other long term (current) drug therapy: Secondary | ICD-10-CM

## 2023-02-14 DIAGNOSIS — L409 Psoriasis, unspecified: Secondary | ICD-10-CM

## 2023-02-14 NOTE — Telephone Encounter (Signed)
Patient uploaded new insurance into Media tab on 02/12/2023. She has [employer] as PBM now. Submitted a Prior Authorization request to [employer] for ENBREL [employer] via CoverMyMeds. Will update once we receive a response.  Key: [id number]   Chesley Mires, PharmD, MPH, BCPS, CPP Clinical Pharmacist (Rheumatology and Pulmonology)

## 2023-02-18 ENCOUNTER — Other Ambulatory Visit (HOSPITAL_COMMUNITY): Payer: Self-pay

## 2023-02-18 MED ORDER — ENBREL SURECLICK 50 MG/ML ~~LOC~~ SOAJ: SUBCUTANEOUS | 0 refills | Status: DC

## 2023-02-18 NOTE — Telephone Encounter (Signed)
Received notification from [facility] regarding a prior authorization for ENBREL. Authorization has been APPROVED from 02/17/2023 to 02/14/2024. Approval letter sent to scan center.  Patient must fill through [facility]: [phone]   Authorization # [id number] Phone # [phone]  Rx for Enbrel sent to pharmacy. Called patient to provide with pharmacy phone number and recommended she call pharmacy tomorrow to provide with savings card and schedule shipment.  Chesley Mires, PharmD, MPH, BCPS, CPP Clinical Pharmacist (Rheumatology and Pulmonology)

## 2023-02-20 ENCOUNTER — Encounter: Payer: No Typology Code available for payment source | Admitting: Rehabilitative and Restorative Service Providers"

## 2023-02-24 ENCOUNTER — Encounter: Payer: Self-pay | Admitting: Rehabilitative and Restorative Service Providers"

## 2023-02-24 ENCOUNTER — Ambulatory Visit: Payer: No Typology Code available for payment source | Admitting: Rehabilitative and Restorative Service Providers"

## 2023-02-24 DIAGNOSIS — M25562 Pain in left knee: Secondary | ICD-10-CM | POA: Diagnosis not present

## 2023-02-24 DIAGNOSIS — R2681 Unsteadiness on feet: Secondary | ICD-10-CM

## 2023-02-24 DIAGNOSIS — M6281 Muscle weakness (generalized): Secondary | ICD-10-CM | POA: Diagnosis not present

## 2023-02-24 DIAGNOSIS — M25662 Stiffness of left knee, not elsewhere classified: Secondary | ICD-10-CM

## 2023-02-24 DIAGNOSIS — G8929 Other chronic pain: Secondary | ICD-10-CM

## 2023-02-24 NOTE — Therapy (Signed)
OUTPATIENT PHYSICAL THERAPY TREATMENT/DISCHARGE NOTE   PHYSICAL THERAPY DISCHARGE SUMMARY  Visits from Start of Care: 6  Current functional level related to goals / functional outcomes: See note   Remaining deficits: See note   Education / Equipment: Updated home exercise program  Patient agrees to discharge. Patient goals were met. Patient is being discharged due to being pleased with the current functional level.    Patient Name: Kimberly Butler MRN: [id number] DOB:1955-08-15, 67 y.o., female Today's Date: 02/24/2023  END OF SESSION:  PT End of Session - 02/24/23 1039     Visit Number 6    Number of Visits 17    Date for PT Re-Evaluation 03/11/23    Authorization Type [employer] BIND    Authorization Time Period 01/14/23 to 03/11/23    PT Start Time 1017    PT Stop Time 1102    PT Time Calculation (min) 45 min    Activity Tolerance Patient tolerated treatment well;No increased pain    Behavior During Therapy WFL for tasks assessed/performed              Past Medical History:  Diagnosis Date   Allergy    RHINITIS   Cancer (HCC)    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   Epilepsy (HCC)    hasn't had seizure in 20 years (01/15/17)   GERD (gastroesophageal reflux disease)    Hypertension    Obesity    Psoriasis    Psoriatic arthritis (HCC)    Smoker    Varicose veins    Past Surgical History:  Procedure Laterality Date   CATARACT EXTRACTION W/ INTRAOCULAR LENS IMPLANT Bilateral    CESAREAN SECTION     CHOLECYSTECTOMY N/A 01/23/2017   Procedure: LAPAROSCOPIC CHOLECYSTECTOMY;  Surgeon: Violeta Gelinas, MD;  Location: [facility] OR;  Service: General;  Laterality: N/A;   TONSILECTOMY, ADENOIDECTOMY, BILATERAL MYRINGOTOMY AND TUBES     TUBAL LIGATION     Patient Active Problem List   Diagnosis Date Noted   Family history of breast cancer 07/11/2021   History of COVID-19 07/31/2020   Lactose intolerance in adult 11/18/2016   Post-menopausal 08/28/2016   High risk medication use 08/28/2016    History of vitamin D deficiency 08/28/2016   History of gastroesophageal reflux (GERD) 08/28/2016   History of iron deficiency anemia 08/28/2016   Psoriasis 07/07/2014   Seizure disorder (HCC) 01/07/2011   Hypertension 01/07/2011   Psoriatic arthritis (HCC) 01/07/2011   Allergic rhinitis due to pollen 01/07/2011   Obesity (BMI 30-39.9) 01/07/2011    PCP: Ronnald Nian, MD  REFERRING PROVIDER: Ronnald Nian, MD  REFERRING DIAG: L40.50 (ICD-10-CM) - Psoriatic arthritis (HCC) M25.562 (ICD-10-CM) - Acute pain of left knee  THERAPY DIAG:  Chronic pain of left knee  Muscle weakness (generalized)  Stiffness of left knee, not elsewhere classified  Unsteadiness on feet  Rationale for Evaluation and Treatment: Rehabilitation  ONSET DATE: about a month ago   SUBJECTIVE:   Per eval - I feel like my pain's not normal. My knee started hurting about a month ago, I'm the type of person to "walk it out" so I kept going. Then when I took a 4 hour pain trip it started hurting again.Sitting and standing doesn't hurt, stairs can hurt depending on the day. Feels like knee pops, or a pressure or something. Its slowing me down just a bit. We were in [place] and all the roads are cobblestones, it was really hard for me to get around there. It takes  me a minute to get going when I first stand up.   SUBJECTIVE STATEMENT: 02/24/2023  Kimberly Butler hasn't taken any Aleve in 2 weeks.  She is pain-free with functional walking and is biking.  She walked a 1/4 mile for exercise and reports less apprehension with stairs.  PERTINENT HISTORY: Skin cancer, epilepsy, HTN, obesity, psoriatic arthritis, current smoker  PAIN:  Are you having pain? No 0/10 this week  PRECAUTIONS: Other: hx of epilepsy, no recent seizures   RED FLAGS: None   WEIGHT BEARING RESTRICTIONS: No  FALLS:  Has patient fallen in last 6 months? No  LIVING ENVIRONMENT: Lives with: lives alone Lives in: House/apartment Stairs: 2 STE   Has following equipment at home:  walking poles   OCCUPATION: building homes on mission trips in [place]   PLOF: Independent, Independent with basic ADLs, Independent with gait, and Independent with transfers  PATIENT GOALS: stop knee pain, learn what I can do to manage my knee and the muscles strong   NEXT MD VISIT: Referring PRN   OBJECTIVE: (objective measures completed at initial evaluation unless otherwise dated)   PATIENT SURVEYS:  02/24/2023 FOTO 70  02/12/2023   FOTO 51  Eval   FOTO 53, predicted 75 in 8 visits   COGNITION: Overall cognitive status: Within functional limits for tasks assessed     SENSATION: Not tested  MUSCLE LENGTH: Hamstrings tight by observation  PALPATION: No areas TTP   LOWER EXTREMITY ROM:  Active ROM Right eval Left eval 02/12/2023 Left/Right in degrees  Hip flexion     Hip extension     Hip abduction     Hip adduction     Hip internal rotation     Hip external rotation     Knee flexion -5* -10* 128/133  Knee extension 127* 106* Full extension bilateral (working on avoiding hyperextension)  Ankle dorsiflexion     Ankle plantarflexion     Ankle inversion     Ankle eversion      (Blank rows = not tested)  LOWER EXTREMITY MMT:  MMT Right eval Left eval Left/Right in pounds 02/12/2023   Hip flexion 3 3    Hip extension      Hip abduction 3+ 3    Hip adduction      Hip internal rotation      Hip external rotation      Knee flexion 4 3+    Knee extension 4 3+ 65.8/77.1   Ankle dorsiflexion 4+ 4+    Ankle plantarflexion      Ankle inversion      Ankle eversion       (Blank rows = not tested)    FUNCTIONAL TESTS:  Timed up and go (TUG): 12 seconds no device; not able to hold tandem stance for more than 5-10 seconds with MinA, unable to hold SLS without MinA from PT     TODAY'S TREATMENT:                                                                                        DATE: 02/24/2023 Recumbent bike Seat 5 for  6 minutes Level 3-7 Seated straight  leg raises 3 sets of 5 for 3 seconds, last set with 1.5# Standing quadriceps stretch 1 x 20 seconds bilateral Lower heel cords stretch 1 x 20 seconds bilateral Seated hamstrings stretch with slight bend in knee 1 x 20 seconds   Functional Activities: Double Leg Press 125# 15 reps slow eccentrics Single Leg Press 62# 10 reps slow eccentrics Step-up and over 6 inch step slow eccentrics 10 x Step-down 6 inch step slow eccentrics 10 x   02/12/2023 Recumbent bike Seat 5 for 5 minutes Level 3-7 Seated straight leg raises 3 sets of 5 for 3 seconds, last set with 1# Standing quadriceps stretch 1 x 20 seconds bilateral Lower heel cords stretch 1 x 20 seconds bilateral Standing quadriceps stretch 1 x 20 seconds bilateral Seated hamstrings stretch with slight bend in knee 1 x 20 seconds   Functional Activities: Double Leg Press 100# 15 reps slow eccentrics Single Leg Press 50# 10 reps slow eccentrics Step-up and over 8 inch step slow eccentrics 6 x Functional squat test   02/04/2023: Therapeutic Exercise:  Recumbent Bike seat 5 level 3 for 1st min, then level 8 for 7 min.   Leg press BLEs 87# 15 reps and single leg 43# 15 reps 2 sets with BLEs. PT verbal cues on technique.  Seated heel slides alternating LEs as exercise to minimize stiffness with prolonged sitting. Pt verbalized and return demo understanding. Seated ankle alphabet as exercise to minimize stiffness with prolonged sitting. Pt verbalized and return demo understanding. Gastroc stretch step heel depression 30 sec hold 2 reps  Quad stretch standing knee flexion 30 sec hold 2 reps  PT added above 5 exercises to HEP with demo, verbal & HO cues including rationale. Pt verbalized and return demo understanding.    PATIENT EDUCATION:  Education details: rationale for interventions Person educated: Patient Education method: Explanation, Demonstration, and Handouts Education comprehension:  verbalized understanding, returned demonstration, and needs further education  HOME EXERCISE PROGRAM: Access Code: [email or web address] URL: [email or web address] Date: 02/04/2023 Prepared by: Vladimir Faster  Exercises - Supine Bridge with Resistance Band  - 1-2 x daily - 7 x weekly - 1 sets - 10 reps - 1-2 seconds hold - Sidelying Hip Abduction with Resistance at Thighs  - 1-2 x daily - 7 x weekly - 1 sets - 10 reps - 1 second hold - Sitting Knee Extension with Resistance  - 1-2 x daily - 7 x weekly - 1 sets - 10 reps - 2 seconds  hold - Seated Hamstring Stretch  - 1-2 x daily - 7 x weekly - 1 sets - 3 reps - 30 seconds  hold - Supine March with Resistance Band  - 1-2 x daily - 7 x weekly - 1 sets - 8 reps - Mini Squat with Counter Support  - 1-2 x daily - 7 x weekly - 1 sets - 5 reps - Small Range Straight Leg Raise  - 2 x daily - 7 x weekly - 3-5 sets - 5 reps - 3 seconds hold - Gastroc Stretch on Step  - 1 x daily - 7 x weekly - 1 sets - 2-3 reps - 20-30 seconds hold - Standing Hamstring Stretch with Step  - 1 x daily - 7 x weekly - 1 sets - 2-3 reps - 20-30 seconds hold - Standing Quadriceps Stretch  - 1 x daily - 7 x weekly - 1 sets - 2-3 reps - 20-30 seconds hold - Seated Knee Flexion Extension AROM   - 1  x daily - 5-7 x weekly - 2-3 sets - 10 reps - 2-5 seconds hold - Seated Ankle Alphabet  - 1 x daily - 5-7 x weekly - 1 sets - 1 reps  ASSESSMENT:  CLINICAL IMPRESSION: 02/24/2023 Barbarann notes better strength, stability and less pain since starting physical therapy.  She feels better with stairs and reports no episodes of her knee "giving way" this week.  She is independent with her home and gym exercises and appears ready for independent rehabilitation.  Per eval - Patient is a 67 y.o. F who was seen today for physical therapy evaluation and treatment for L knee pain with history of psoriatic OA. Exam with objective findings as above. Will really benefit from skilled PT services to  address concerns and objective findings, also to maximize level of function and allow her to safely complete upcoming mission trips in the next year.    OBJECTIVE IMPAIRMENTS: Abnormal gait, decreased activity tolerance, decreased balance, decreased mobility, difficulty walking, decreased ROM, decreased strength, hypomobility, impaired flexibility, obesity, and pain.   ACTIVITY LIMITATIONS: squatting, stairs, transfers, and locomotion level  PARTICIPATION LIMITATIONS: driving, shopping, community activity, occupation, and yard work  PERSONAL FACTORS: Age, Behavior pattern, Education, Fitness, Past/current experiences, Social background, and Time since onset of injury/illness/exacerbation are also affecting patient's functional outcome.   REHAB POTENTIAL: Good  CLINICAL DECISION MAKING: Stable/uncomplicated  EVALUATION COMPLEXITY: Low   GOALS: Goals reviewed with patient? Yes  SHORT TERM GOALS: Target date: 02/11/2023   Will be compliant with appropriate progressive HEP Baseline: Goal status: MET 01/28/2023  2.  Pain to have been no more than 2/10 at worst  Baseline:  Goal status: Met 02/24/2023  3.  L knee AROM to be equal to that of the right  Baseline:  Goal status: Ongoing 02/12/2023  4.  Will be able to hold tandem stance for 10 seconds independently and SLS for 5 seconds independently  Baseline:  Goal status: Met 02/24/2023    LONG TERM GOALS: Target date: 03/11/2023    MMT to improve by at least one grade all weak groups  Baseline:  Goal status: Met 02/24/2023  2.  Will be able to hold tandem stance for at least 30 seconds and SLS for at least 10 seconds independently to show reduced fall risk  Baseline:  Goal status: Ongoing 02/12/2023  3.  Will be able to navigate stairs and uneven surfaces without increase from resting pain level  Baseline:  Goal status: Partially Met 02/12/2023  4.  Will be independent with advanced HEP vs independent gym program to maintain  functional gains and prevent return of knee pain  Baseline:  Goal status: Met 02/24/2023  5.  FOTO score to be within 5 points of predicted value by time of DC  Baseline:  Goal status: Met 02/24/2023     PLAN:  PT FREQUENCY: DC  PT DURATION: DC  PLANNED INTERVENTIONS: Therapeutic exercises, Therapeutic activity, Neuromuscular re-education, Balance training, Gait training, Patient/Family education, Self Care, Joint mobilization, Aquatic Therapy, Dry Needling, Electrical stimulation, Cryotherapy, Moist heat, Taping, Ultrasound, Ionotophoresis 4mg /ml Dexamethasone, Manual therapy, and Re-evaluation  PLAN FOR NEXT SESSION: DC  Cherlyn Cushing, PT, MPT 02/24/2023, 6:37 PM

## 2023-03-05 ENCOUNTER — Encounter: Payer: Self-pay | Admitting: *Deleted

## 2023-03-05 ENCOUNTER — Telehealth: Payer: Self-pay | Admitting: *Deleted

## 2023-03-05 ENCOUNTER — Encounter: Payer: No Typology Code available for payment source | Admitting: Rehabilitative and Restorative Service Providers"

## 2023-03-05 NOTE — Telephone Encounter (Signed)
Received a fax from [facility] requesting surgical clearance for patient. Faxed back letter. See letter tab for details.

## 2023-03-06 ENCOUNTER — Encounter: Payer: No Typology Code available for payment source | Admitting: Rehabilitative and Restorative Service Providers"

## 2023-03-12 ENCOUNTER — Encounter: Payer: No Typology Code available for payment source | Admitting: Rehabilitative and Restorative Service Providers"

## 2023-03-13 ENCOUNTER — Encounter: Payer: No Typology Code available for payment source | Admitting: Rehabilitative and Restorative Service Providers"

## 2023-03-14 ENCOUNTER — Other Ambulatory Visit: Payer: Self-pay | Admitting: Rheumatology

## 2023-03-14 DIAGNOSIS — L4050 Arthropathic psoriasis, unspecified: Secondary | ICD-10-CM

## 2023-03-14 DIAGNOSIS — L409 Psoriasis, unspecified: Secondary | ICD-10-CM

## 2023-03-14 DIAGNOSIS — Z79899 Other long term (current) drug therapy: Secondary | ICD-10-CM

## 2023-03-14 NOTE — Telephone Encounter (Signed)
Last Fill: 02/18/2023  Labs: 01/06/2023 CBC WNL, BUN/Creat. Ratio 31  TB Gold: 01/10/2022 Neg    Next Visit: 04/16/2023  Last Visit: 11/14/2022  WG:[id number] arthritis   Current Dose per office note 11/14/2022: Enbrel SureClick 50 mg sq injections every 7 days   Patient to update TB Gold with standing labs at the end of October.   Okay to refill Enbrel?

## 2023-04-04 ENCOUNTER — Other Ambulatory Visit: Payer: Self-pay | Admitting: Physician Assistant

## 2023-04-04 DIAGNOSIS — L4050 Arthropathic psoriasis, unspecified: Secondary | ICD-10-CM

## 2023-04-04 DIAGNOSIS — L409 Psoriasis, unspecified: Secondary | ICD-10-CM

## 2023-04-04 DIAGNOSIS — Z79899 Other long term (current) drug therapy: Secondary | ICD-10-CM

## 2023-04-04 NOTE — Progress Notes (Signed)
Office Visit Note  Patient: Kimberly Butler             Date of Birth: 1955-06-12           MRN: [id number]             PCP: Ronnald Nian, MD Referring: Ronnald Nian, MD Visit Date: 04/16/2023 Occupation: @GUAROCC @  Subjective:  Intermittent right ankle pain  History of Present Illness: Kimberly Butler is a 67 y.o. female with psoriatic arthritis and osteoarthritis.  She returns today after her last visit on November 14, 2022.  Patient states she went on a mission trip and after that she was having pain in her left lower extremity.  She was seen by Dr. Susann Givens and was referred to physical therapy.  She has been going to physical therapy on a regular basis.  She states she had x-rays which were unremarkable.  She continues to have intermittent discomfort in her right ankle.  None of the other joints are painful or swollen.  She has been taking Enbrel 50 mg subcu weekly shots and methotrexate 0.3 mg subcu every other week with folic acid 1 mg p.o. daily without any interruption.  Her left knee joint pain has resolved.  Has not had any recurrence of right trochanteric bursitis.   Activities of Daily Living:  Patient reports morning stiffness for 5  minutes.   Patient Denies nocturnal pain.  Difficulty dressing/grooming: Denies Difficulty climbing stairs: Denies Difficulty getting out of chair: Denies Difficulty using hands for taps, buttons, cutlery, and/or writing: Denies  Review of Systems  Constitutional:  Negative for fatigue.  HENT:  Negative for mouth sores and mouth dryness.   Eyes:  Negative for itching.  Respiratory:  Negative for shortness of breath.   Cardiovascular:  Negative for palpitations.  Gastrointestinal:  Negative for blood in stool, constipation and diarrhea.  Endocrine: Negative for increased urination.  Genitourinary:  Negative for painful urination.  Musculoskeletal:  Positive for joint pain, joint pain and morning stiffness. Negative for myalgias and myalgias.   Skin:  Negative for color change, rash and sensitivity to sunlight.  Allergic/Immunologic: Negative for susceptible to infections.  Neurological:  Negative for weakness.  Hematological:  Negative for swollen glands.  Psychiatric/Behavioral:  Negative for depressed mood and sleep disturbance. The patient is not nervous/anxious.     PMFS History:  Patient Active Problem List   Diagnosis Date Noted   Family history of breast cancer 07/11/2021   History of COVID-19 07/31/2020   Lactose intolerance in adult 11/18/2016   Post-menopausal 08/28/2016   High risk medication use 08/28/2016   History of vitamin D deficiency 08/28/2016   History of gastroesophageal reflux (GERD) 08/28/2016   History of iron deficiency anemia 08/28/2016   Psoriasis 07/07/2014   Seizure disorder (HCC) 01/07/2011   Hypertension 01/07/2011   Psoriatic arthritis (HCC) 01/07/2011   Allergic rhinitis due to pollen 01/07/2011   Obesity (BMI 30-39.9) 01/07/2011    Past Medical History:  Diagnosis Date   Allergy    RHINITIS   Cancer (HCC)    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   Epilepsy (HCC)    hasn't had seizure in 20 years (01/15/17)   GERD (gastroesophageal reflux disease)    Hypertension    Obesity    Psoriasis    Psoriatic arthritis (HCC)    Smoker    Varicose veins     Family History  Problem Relation Age of Onset   Cancer Mother    Heart  disease Mother    Arthritis Father    Kidney failure Father    Dementia Father    Diabetes Father    Cancer Sister    Cancer Brother    Healthy Son    Past Surgical History:  Procedure Laterality Date   CATARACT EXTRACTION W/ INTRAOCULAR LENS IMPLANT Bilateral    CESAREAN SECTION     CHOLECYSTECTOMY N/A 01/23/2017   Procedure: LAPAROSCOPIC CHOLECYSTECTOMY;  Surgeon: Violeta Gelinas, MD;  Location: [facility] OR;  Service: General;  Laterality: N/A;   TONSILECTOMY, ADENOIDECTOMY, BILATERAL MYRINGOTOMY AND TUBES     TUBAL LIGATION     Social History   Social History Narrative    Not on file   Immunization History  Administered Date(s) Administered   DTaP 02/06/1994, 11/15/2004   Fluad Quad(high Dose 65+) 05/23/2022   Influenza Inj Mdck Quad Pf 03/10/2017   Influenza Whole 07/24/2009   Influenza,inj,Quad PF,6+ Mos 03/29/2016, 03/24/2018, 03/22/2019, 03/22/2020   Influenza-Unspecified 03/10/2014, 03/10/2017   PPD Test 10/20/1992   Pneumococcal Conjugate-13 12/18/2007   Pneumococcal Polysaccharide-23 03/31/2014   Tdap 03/29/2016   Zoster Recombinant(Shingrix) 12/15/2018, 03/22/2019     Objective: Vital Signs: BP 126/80 (BP Location: Left Arm, Patient Position: Sitting, Cuff Size: Normal)   Pulse (!) 51   Resp 15   Ht 5\' 7"  (1.702 m)   Wt 204 lb 3.2 oz (92.6 kg)   BMI 31.98 kg/m    Physical Exam Vitals and nursing note reviewed.  Constitutional:      Appearance: She is well-developed.  HENT:     Head: Normocephalic and atraumatic.  Eyes:     Conjunctiva/sclera: Conjunctivae normal.  Cardiovascular:     Rate and Rhythm: Normal rate and regular rhythm.     Heart sounds: Normal heart sounds.  Pulmonary:     Effort: Pulmonary effort is normal.     Breath sounds: Normal breath sounds.  Abdominal:     General: Bowel sounds are normal.     Palpations: Abdomen is soft.  Musculoskeletal:     Cervical back: Normal range of motion.     Right lower leg: Edema present.  Lymphadenopathy:     Cervical: No cervical adenopathy.  Skin:    General: Skin is warm and dry.     Capillary Refill: Capillary refill takes less than 2 seconds.  Neurological:     Mental Status: She is alert and oriented to person, place, and time.  Psychiatric:        Behavior: Behavior normal.      Musculoskeletal Exam: Cervical spine was in good range of motion.  Thoracic kyphosis was noted.  She had no tenderness over thoracic or lumbar spine.  No SI joint tenderness was noted.  Shoulder joints, elbow joints, wrist joints, MCPs PIPs and DIPs with good range of motion with no  synovitis.  Hip joints and knee joints in good range of motion without any warmth swelling or effusion.  She has some edema on her right lower extremity.  There was no tenderness over ankles or MTPs.  There was no plantar fasciitis or Achilles tendinitis.  CDAI Exam: CDAI Score: -- Patient Global: --; Provider Global: -- Swollen: --; Tender: -- Joint Exam 04/16/2023   No joint exam has been documented for this visit   There is currently no information documented on the homunculus. Go to the Rheumatology activity and complete the homunculus joint exam.  Investigation: No additional findings.  Imaging: No results found.  Recent Labs: Lab Results  Component Value  Date   WBC 5.0 01/06/2023   HGB 12.1 01/06/2023   PLT 233 01/06/2023   NA 140 01/06/2023   K 4.2 01/06/2023   CL 102 01/06/2023   CO2 25 01/06/2023   GLUCOSE 84 01/06/2023   BUN 21 01/06/2023   CREATININE 0.68 01/06/2023   BILITOT 0.2 01/06/2023   ALKPHOS 102 01/06/2023   AST 23 01/06/2023   ALT 17 01/06/2023   PROT 6.7 01/06/2023   ALBUMIN 4.0 01/06/2023   CALCIUM 9.5 01/06/2023   GFRAA 85 11/01/2020   QFTBGOLDPLUS NEGATIVE 01/10/2022    Speciality Comments: No specialty comments available.  Procedures:  No procedures performed Allergies: Metoclopramide and Promethazine   Assessment / Plan:     Visit Diagnoses: Psoriatic arthritis (HCC)-patient denies having a flare of psoriatic arthritis since the last visit.  She denies any joint inflammation or swelling.  She has intermittent right lower extremity edema.  No synovitis was noted on the examination today.  There was no plantar fasciitis or Achilles tendinitis.  Patient denies any history of uveitis.  She has been taking Enbrel 50 mg subcu weekly along with methotrexate 0.3 mL subcu every other week without any interruption.  She has been tolerating both medications well.  Psoriasis-she gets occasional psoriasis patches for which she uses topical agents as  needed.  She did not have to use any agents in a while.  High risk medication use - Enbrel SureClick 50 mg sq injections every 7 days, methotrexate 0.3 ml sq injections every other week-due to hair loss (taking nutrafol), and folic acid 1mg . -Labs obtained on January 06, 2023 CBC and CMP were normal.  TB Gold was negative on January 10, 2022.  Will check labs and TB Gold today.  Plan: CBC with Differential/Platelet, COMPLETE METABOLIC PANEL WITH GFR, QuantiFERON-TB Gold Plus.  She will have repeat labs in February and every 3 months.  Information on immunization was placed in the AVS.  She was advised to hold methotrexate and Enbrel if she develops an infection resume after the infection resolves.  Increased risk of nonmelanoma skin cancer within Ro was discussed.  Annual skin examination to screen for skin cancer was advised.  Use of sunscreen and sun protection was discussed.  Primary osteoarthritis of both hands-she has mild PIP and DIP thickening.  No synovitis was noted.  Primary osteoarthritis of both knees-she denies any discomfort in her knee joints today.  No warmth swelling or effusion was noted.  She developed left lower extremity pain after a trip.  She is going to physical therapy which has been helpful.  Trochanteric bursitis, right hip-she has intermittent pain.  She denies any discomfort today.  Vitamin D deficiency-vitamin D was low at 22.5 on July 18, 2022.  Will recheck vitamin D level today.  Essential hypertension  History of gastroesophageal reflux (GERD)  History of iron deficiency anemia  History of epilepsy  Orders: Orders Placed This Encounter  Procedures   CBC with Differential/Platelet   COMPLETE METABOLIC PANEL WITH GFR   QuantiFERON-TB Gold Plus   VITAMIN D 25 Hydroxy (Vit-D Deficiency, Fractures)   No orders of the defined types were placed in this encounter.    Follow-Up Instructions: Return in about 5 months (around 09/14/2023) for Psoriatic arthritis,  Osteoarthritis.   Pollyann Savoy, MD  Note - This record has been created using Animal nutritionist.  Chart creation errors have been sought, but may not always  have been located. Such creation errors do not reflect on  the standard  of medical care.

## 2023-04-04 NOTE — Telephone Encounter (Signed)
Last Fill: 03/14/2023   Labs: 01/06/2023 CBC WNL, BUN/Creat. Ratio 31   TB Gold: 01/10/2022 Neg     Next Visit: 04/16/2023   Last Visit: 11/14/2022   [id number] arthritis    Current Dose per office note 11/14/2022: Enbrel SureClick 50 mg sq injections every 7 days    Patient to update TB Gold with standing labs at upcoming appointment on 04/16/2023    Okay to refill Enbrel?

## 2023-04-16 ENCOUNTER — Ambulatory Visit: Payer: No Typology Code available for payment source | Attending: Rheumatology | Admitting: Rheumatology

## 2023-04-16 ENCOUNTER — Encounter: Payer: Self-pay | Admitting: Rheumatology

## 2023-04-16 VITALS — BP 126/80 | HR 51 | Resp 15 | Ht 67.0 in | Wt 204.2 lb

## 2023-04-16 DIAGNOSIS — E559 Vitamin D deficiency, unspecified: Secondary | ICD-10-CM

## 2023-04-16 DIAGNOSIS — L409 Psoriasis, unspecified: Secondary | ICD-10-CM | POA: Diagnosis not present

## 2023-04-16 DIAGNOSIS — M19042 Primary osteoarthritis, left hand: Secondary | ICD-10-CM

## 2023-04-16 DIAGNOSIS — Z79899 Other long term (current) drug therapy: Secondary | ICD-10-CM

## 2023-04-16 DIAGNOSIS — L4050 Arthropathic psoriasis, unspecified: Secondary | ICD-10-CM

## 2023-04-16 DIAGNOSIS — M19041 Primary osteoarthritis, right hand: Secondary | ICD-10-CM | POA: Diagnosis not present

## 2023-04-16 DIAGNOSIS — I10 Essential (primary) hypertension: Secondary | ICD-10-CM

## 2023-04-16 DIAGNOSIS — Z8669 Personal history of other diseases of the nervous system and sense organs: Secondary | ICD-10-CM

## 2023-04-16 DIAGNOSIS — M7061 Trochanteric bursitis, right hip: Secondary | ICD-10-CM

## 2023-04-16 DIAGNOSIS — M170 Bilateral primary osteoarthritis of knee: Secondary | ICD-10-CM

## 2023-04-16 DIAGNOSIS — Z862 Personal history of diseases of the blood and blood-forming organs and certain disorders involving the immune mechanism: Secondary | ICD-10-CM

## 2023-04-16 DIAGNOSIS — Z8719 Personal history of other diseases of the digestive system: Secondary | ICD-10-CM

## 2023-04-16 NOTE — Patient Instructions (Signed)
Standing Labs We placed an order today for your standing lab work.   Please have your standing labs drawn in February and every 3 months  Please have your labs drawn 2 weeks prior to your appointment so that the provider can discuss your lab results at your appointment, if possible.  Please note that you may see your imaging and lab results in MyChart before we have reviewed them. We will contact you once all results are reviewed. Please allow our office up to 72 hours to thoroughly review all of the results before contacting the office for clarification of your results.  WALK-IN LAB HOURS  Monday through Thursday from 8:00 am -12:30 pm and 1:00 pm-5:00 pm and Friday from 8:00 am-12:00 pm.  Patients with office visits requiring labs will be seen before walk-in labs.  You may encounter longer than normal wait times. Please allow additional time. Wait times may be shorter on  Monday and Thursday afternoons.  We do not book appointments for walk-in labs. We appreciate your patience and understanding with our staff.   Labs are drawn by [facility]. Please bring your co-pay at the time of your lab draw.  You may receive a bill from [facility] for your lab work.  Please note if you are on Hydroxychloroquine and and an order has been placed for a Hydroxychloroquine level,  you will need to have it drawn 4 hours or more after your last dose.  If you wish to have your labs drawn at another location, please call the office 24 hours in advance so we can fax the orders.  The office is located at [address], [place], [place] [address]   If you have any questions regarding directions or hours of operation,  please call [phone].   As a reminder, please drink plenty of water prior to coming for your lab work. Thanks!   Vaccines You are taking a medication(s) that can suppress your immune system.  The following immunizations are recommended: Flu annually Covid-19  RSV Td/Tdap (tetanus,  diphtheria, pertussis) every 10 years Pneumonia (Prevnar 15 then Pneumovax 23 at least 1 year apart.  Alternatively, can take Prevnar 20 without needing additional dose) Shingrix: 2 doses from 4 weeks to 6 months apart  Please check with your PCP to make sure you are up to date.   If you have signs or symptoms of an infection or start antibiotics: First, call your PCP for workup of your infection. Hold your medication through the infection, until you complete your antibiotics, and until symptoms resolve if you take the following: Injectable medication (Actemra, Benlysta, Cimzia, Cosentyx, Enbrel, Humira, Kevzara, Orencia, Remicade, Simponi, Stelara, Taltz, Tremfya) Methotrexate Leflunomide (Arava) Mycophenolate (Cellcept) Harriette Ohara, Olumiant, or Rinvoq  Please get an annual skin examination while you are on Enbrel.  Please use sunscreen and sun protection.

## 2023-04-18 LAB — COMPLETE METABOLIC PANEL WITH GFR
AG Ratio: 1.3 (calc) (ref 1.0–2.5)
ALT: 13 U/L (ref 6–29)
AST: 18 U/L (ref 10–35)
Albumin: 4 g/dL (ref 3.6–5.1)
Alkaline phosphatase (APISO): 91 U/L (ref 37–153)
BUN: 20 mg/dL (ref 7–25)
CO2: 30 mmol/L (ref 20–32)
Calcium: 9.4 mg/dL (ref 8.6–10.4)
Chloride: 104 mmol/L (ref 98–110)
Creat: 0.6 mg/dL (ref 0.50–1.05)
Globulin: 3.2 g/dL (ref 1.9–3.7)
Glucose, Bld: 90 mg/dL (ref 65–99)
Potassium: 4.3 mmol/L (ref 3.5–5.3)
Sodium: 140 mmol/L (ref 135–146)
Total Bilirubin: 0.3 mg/dL (ref 0.2–1.2)
Total Protein: 7.2 g/dL (ref 6.1–8.1)
eGFR: 98 mL/min/{1.73_m2} (ref >=60)

## 2023-04-18 LAB — CBC WITH DIFFERENTIAL/PLATELET
Absolute Lymphocytes: 2475 {cells}/uL (ref 850–3900)
Absolute Monocytes: 546 {cells}/uL (ref 200–950)
Basophils Absolute: 31 {cells}/uL (ref 0–200)
Basophils Relative: 0.6 %
Eosinophils Absolute: 120 {cells}/uL (ref 15–500)
Eosinophils Relative: 2.3 %
HCT: 35.7 % (ref 35.0–45.0)
Hemoglobin: 11.7 g/dL (ref 11.7–15.5)
MCH: 31.3 pg (ref 27.0–33.0)
MCHC: 32.8 g/dL (ref 32.0–36.0)
MCV: 95.5 fL (ref 80.0–100.0)
MPV: 9.9 fL (ref 7.5–12.5)
Monocytes Relative: 10.5 %
Neutro Abs: 2028 {cells}/uL (ref 1500–7800)
Neutrophils Relative %: 39 %
Platelets: 240 10*3/uL (ref 140–400)
RBC: 3.74 10*6/uL — ABNORMAL LOW (ref 3.80–5.10)
RDW: 12.8 % (ref 11.0–15.0)
Total Lymphocyte: 47.6 %
WBC: 5.2 10*3/uL (ref 3.8–10.8)

## 2023-04-18 LAB — QUANTIFERON-TB GOLD PLUS
Mitogen-NIL: 7.67 [IU]/mL
NIL: 0.02 [IU]/mL
QuantiFERON-TB Gold Plus: NEGATIVE
TB1-NIL: 0.03 [IU]/mL
TB2-NIL: 0.14 [IU]/mL

## 2023-04-18 LAB — VITAMIN D 25 HYDROXY (VIT D DEFICIENCY, FRACTURES): Vit D, 25-Hydroxy: 33 ng/mL (ref 30–100)

## 2023-04-20 NOTE — Progress Notes (Signed)
Vitamin D is low normal.  Patient should continue take vitamin D.  TB Gold is negative, CBC normal.

## 2023-04-25 ENCOUNTER — Other Ambulatory Visit: Payer: Self-pay | Admitting: Rheumatology

## 2023-04-25 DIAGNOSIS — L409 Psoriasis, unspecified: Secondary | ICD-10-CM

## 2023-04-25 DIAGNOSIS — L4050 Arthropathic psoriasis, unspecified: Secondary | ICD-10-CM

## 2023-04-25 DIAGNOSIS — Z79899 Other long term (current) drug therapy: Secondary | ICD-10-CM

## 2023-04-25 NOTE — Telephone Encounter (Signed)
Last Fill: 04/04/2023 (30 day supply)  Labs: 04/16/2023 CBC normal. CMP WNL  TB Gold: 04/16/2023 Neg    Next Visit: 09/15/2023  Last Visit: 04/16/2023  UE:[id number] arthritis   Current Dose per office note 04/16/2023: Enbrel SureClick 50 mg sq injections every 7 days   Okay to refill Enbrel?

## 2023-05-25 ENCOUNTER — Other Ambulatory Visit: Payer: Self-pay | Admitting: Physician Assistant

## 2023-05-25 DIAGNOSIS — Z79899 Other long term (current) drug therapy: Secondary | ICD-10-CM

## 2023-05-25 DIAGNOSIS — L4050 Arthropathic psoriasis, unspecified: Secondary | ICD-10-CM

## 2023-05-25 DIAGNOSIS — L409 Psoriasis, unspecified: Secondary | ICD-10-CM

## 2023-05-26 NOTE — Telephone Encounter (Signed)
Last Fill: 11/14/2022  Labs: 04/16/2023 Vitamin D is low normal.  Patient should continue take vitamin D.  TB Gold is negative, CBC normal.   Next Visit: 09/15/2022  Last Visit: 04/16/2023  DX: Psoriatic arthritis   Current Dose per office note 04/16/2023: methotrexate 0.3 ml sq injections every other week   Okay to refill Methotrexate?

## 2023-06-21 ENCOUNTER — Other Ambulatory Visit: Payer: Self-pay | Admitting: Family Medicine

## 2023-06-21 DIAGNOSIS — G40909 Epilepsy, unspecified, not intractable, without status epilepticus: Secondary | ICD-10-CM

## 2023-07-05 ENCOUNTER — Other Ambulatory Visit: Payer: Self-pay | Admitting: Rheumatology

## 2023-07-05 DIAGNOSIS — L4050 Arthropathic psoriasis, unspecified: Secondary | ICD-10-CM

## 2023-07-05 DIAGNOSIS — L409 Psoriasis, unspecified: Secondary | ICD-10-CM

## 2023-07-05 DIAGNOSIS — Z79899 Other long term (current) drug therapy: Secondary | ICD-10-CM

## 2023-07-07 NOTE — Telephone Encounter (Signed)
Last Fill: 04/25/2023  Labs: 04/16/2023 CBC normal.   TB Gold: 04/16/2023 Neg   Next Visit: 09/15/2023  Last Visit: 04/16/2023  OZ:[id number] arthritis   Current Dose per office note 04/16/2023: Enbrel SureClick 50 mg sq injections every 7 days   Okay to refill Enbrel?

## 2023-07-09 ENCOUNTER — Telehealth: Payer: Self-pay | Admitting: Pharmacist

## 2023-07-09 NOTE — Telephone Encounter (Signed)
Received letter from [facility] that patient's Enbrel requires PA. Appears that patient has switched to Medicare  Submitted a Prior Authorization request to [facility] for ENBREL via CoverMyMeds. Will update once we receive a response.  Key: [id number]   May need to apply for patient assistance through [employer] if copay is expensive.  Chesley Mires, PharmD, MPH, BCPS, CPP Clinical Pharmacist (Rheumatology and Pulmonology)

## 2023-07-14 NOTE — Telephone Encounter (Signed)
Patient contacted the office and states she received a letter from [facility] stating she will be given a 30 day supply of Enbrel to give her time to find something else to take because the Enbrel is not covered. Patient states she had called [facility] and was told she would have to pay $2000 and then was told she owed nothing. Patient states she is confused and would like a call back. Please advise.

## 2023-07-15 ENCOUNTER — Other Ambulatory Visit (HOSPITAL_COMMUNITY): Payer: Self-pay

## 2023-07-15 MED ORDER — ENBREL SURECLICK 50 MG/ML ~~LOC~~ SOAJ: SUBCUTANEOUS | 2 refills | Status: DC

## 2023-07-15 NOTE — Telephone Encounter (Addendum)
 Received notification from [employer] regarding a prior authorization for ENBREL . Authorization has been APPROVED from 06/25/2023 until further notice. Approval letter sent to scan center.  Per test claim, copay for 28 days supply is [phone] (before Payment Plan)  Patient can continue to fill through [facility]: [phone]   Authorization # [id number] Phone # [phone]  Per test claim of Enbrel , patient appears to be enrolled into Medicare Prescription payment Plan. Her cost at point of service for pharmacy will be $0 for remainder of year.    Called patient to discuss. Phone went straight to VM. Left VM advising of above and that she should be able to continue filling through [facility].  Sherry Pennant, PharmD, MPH, BCPS, CPP Clinical Pharmacist (Rheumatology and Pulmonology)

## 2023-07-15 NOTE — Addendum Note (Signed)
Addended by: Henriette Combs on: 07/15/2023 01:50 PM   Modules accepted: Orders

## 2023-07-16 ENCOUNTER — Telehealth: Payer: Self-pay | Admitting: Pharmacist

## 2023-07-16 NOTE — Telephone Encounter (Addendum)
 Spoke with patient - she states she'd like to apply for [employer] PAP and will disenroll from [facility] Prescription payment Plan after getting her Enbrel  refill (since she has already paid towards the Payment Plan this month)  She states she will be in the area today and will stop by clinic to complete PAP form for Enbrel . Patient forms placed in file cabinet up front  Prescriber form placed in Dr. Jammie folder for signature  Sherry Pennant, PharmD, MPH, BCPS, CPP Clinical Pharmacist (Rheumatology and Pulmonology)

## 2023-07-16 NOTE — Telephone Encounter (Signed)
 Spoke with patient - she states she'd like to apply for [employer] PAP and will disenroll from [facility] Prescription payment Plan after getting her Enbrel  refill (since she has already paid towards the Payment Plan this month)  She states she will be in the area today and will stop by clinic to complete PAP form for Enbrel   Sherry Pennant, PharmD, MPH, BCPS, CPP Clinical Pharmacist (Rheumatology and Pulmonology)

## 2023-07-17 NOTE — Telephone Encounter (Signed)
 Received signed provider form from Dr. Dolphus as well as patient. However patient did not complete income information.  ATC patient to collect. Unable to reach. Left VM  Sherry Pennant, PharmD, MPH, BCPS, CPP Clinical Pharmacist (Rheumatology and Pulmonology)

## 2023-07-17 NOTE — Telephone Encounter (Signed)
 Called patient again - she provided necessary income information  Submitted Patient Assistance Application to [employer] for ENBREL  along with provider portion, patient portion, PA, medication list, insurance card copy. Will update patient when we receive a response.  Phone #: [phone] Fax #: [phone]

## 2023-07-29 NOTE — Telephone Encounter (Signed)
Received fax from [employer] stating patient must apply for Social Security LIS (Extra Help). Called patient to advise. She will try to apply online  LIS Phone: [phone]  Chesley Mires, PharmD, MPH, BCPS, CPP Clinical Pharmacist (Rheumatology and Pulmonology)

## 2023-07-30 NOTE — Telephone Encounter (Signed)
Patient called about LIS process which she started yesterday. She states she does not feel comfortable providing every detail of her income information to [employer]. She states she would not like to apply for LIS.  I did discuss that there is absolutely no workaround to get Enbrel through [employer] without this step. Advised her that she can think about what decision is best for her. Reviewed again that the max OOP for rx drugs is $2000 but she can also opt into Medicare Payment Plan if needed to help distribute that cost. She would have to call insurance to opt into the [facility] Payment Plan.  She will think about it and call clinic back with decision so that we may document for future refills and close out PAP encounter as needed  Chesley Mires, PharmD, MPH, BCPS, CPP Clinical Pharmacist (Rheumatology and Pulmonology)

## 2023-08-01 ENCOUNTER — Telehealth: Payer: Self-pay | Admitting: *Deleted

## 2023-08-01 NOTE — Telephone Encounter (Signed)
Patient contacted the office to advise she has applied for the extra help through [employer] and is awaiting a response. Patient states she will  update you once she has a response.

## 2023-08-01 NOTE — Telephone Encounter (Signed)
Noted will await response  

## 2023-08-04 ENCOUNTER — Other Ambulatory Visit: Payer: Self-pay | Admitting: *Deleted

## 2023-08-04 DIAGNOSIS — L4050 Arthropathic psoriasis, unspecified: Secondary | ICD-10-CM

## 2023-08-04 DIAGNOSIS — Z79899 Other long term (current) drug therapy: Secondary | ICD-10-CM

## 2023-08-04 DIAGNOSIS — L409 Psoriasis, unspecified: Secondary | ICD-10-CM

## 2023-08-04 MED ORDER — METHOTREXATE SODIUM CHEMO INJECTION (PF) 50 MG/2ML: INTRAMUSCULAR | 2 refills | Status: DC

## 2023-08-04 NOTE — Telephone Encounter (Signed)
 Please remind patient that the labs are due.

## 2023-08-04 NOTE — Telephone Encounter (Signed)
 Patient advised.

## 2023-08-04 NOTE — Addendum Note (Signed)
 Addended by: Henriette Combs on: 08/04/2023 02:31 PM   Modules accepted: Orders

## 2023-08-04 NOTE — Telephone Encounter (Signed)
 Patient contacted the office and request a refill on her MTX to be sent to the [facility] in [place],.  Last Fill: 05/26/2023  Labs: 04/16/2023 Vitamin D is low normal.  Patient should continue take vitamin D.  TB Gold is negative, CBC normal.   Next Visit: 09/15/2023  Last Visit: 04/16/2023  DX: Psoriatic arthritis   Current Dose per office note 04/16/2023: methotrexate 0.3 ml sq injections every other week   Okay to refill Methotrexate?

## 2023-08-05 ENCOUNTER — Encounter: Payer: Self-pay | Admitting: Internal Medicine

## 2023-08-05 ENCOUNTER — Other Ambulatory Visit: Payer: Self-pay | Admitting: *Deleted

## 2023-08-05 DIAGNOSIS — Z79899 Other long term (current) drug therapy: Secondary | ICD-10-CM

## 2023-08-06 ENCOUNTER — Ambulatory Visit: Payer: No Typology Code available for payment source | Admitting: Family Medicine

## 2023-08-06 LAB — COMPLETE METABOLIC PANEL WITH GFR
AG Ratio: 1.3 (calc) (ref 1.0–2.5)
ALT: 13 U/L (ref 6–29)
AST: 21 U/L (ref 10–35)
Albumin: 4 g/dL (ref 3.6–5.1)
Alkaline phosphatase (APISO): 81 U/L (ref 37–153)
BUN: 18 mg/dL (ref 7–25)
CO2: 30 mmol/L (ref 20–32)
Calcium: 9.2 mg/dL (ref 8.6–10.4)
Chloride: 101 mmol/L (ref 98–110)
Creat: 0.84 mg/dL (ref 0.50–1.05)
Globulin: 3.1 g/dL (ref 1.9–3.7)
Glucose, Bld: 78 mg/dL (ref 65–99)
Potassium: 4 mmol/L (ref 3.5–5.3)
Sodium: 138 mmol/L (ref 135–146)
Total Bilirubin: 0.3 mg/dL (ref 0.2–1.2)
Total Protein: 7.1 g/dL (ref 6.1–8.1)
eGFR: 76 mL/min/{1.73_m2} (ref >=60)

## 2023-08-06 LAB — CBC WITH DIFFERENTIAL/PLATELET
Absolute Lymphocytes: 2745 {cells}/uL (ref 850–3900)
Absolute Monocytes: 464 {cells}/uL (ref 200–950)
Basophils Absolute: 31 {cells}/uL (ref 0–200)
Basophils Relative: 0.5 %
Eosinophils Absolute: 183 {cells}/uL (ref 15–500)
Eosinophils Relative: 3 %
HCT: 35 % (ref 35.0–45.0)
Hemoglobin: 11.7 g/dL (ref 11.7–15.5)
MCH: 31.6 pg (ref 27.0–33.0)
MCHC: 33.4 g/dL (ref 32.0–36.0)
MCV: 94.6 fL (ref 80.0–100.0)
MPV: 10 fL (ref 7.5–12.5)
Monocytes Relative: 7.6 %
Neutro Abs: 2678 {cells}/uL (ref 1500–7800)
Neutrophils Relative %: 43.9 %
Platelets: 249 10*3/uL (ref 140–400)
RBC: 3.7 10*6/uL — ABNORMAL LOW (ref 3.80–5.10)
RDW: 12.1 % (ref 11.0–15.0)
Total Lymphocyte: 45 %
WBC: 6.1 10*3/uL (ref 3.8–10.8)

## 2023-08-06 NOTE — Progress Notes (Signed)
 CBC and CMP normal

## 2023-08-07 NOTE — Telephone Encounter (Signed)
 Returned call to patient - she states that she was told my SS LIS that she will be denied. They will be mailing letter to her home within 30 days. She will drop off to clinic once received so that we may fax to [employer]  Chesley Mires, PharmD, MPH, BCPS, CPP Clinical Pharmacist (Rheumatology and Pulmonology)

## 2023-08-19 ENCOUNTER — Ambulatory Visit: Payer: No Typology Code available for payment source | Admitting: Family Medicine

## 2023-09-01 NOTE — Progress Notes (Signed)
 Office Visit Note  Patient: Kimberly Butler             Date of Birth: 1956-01-09           MRN: [id number]             PCP: Ronnald Nian, MD Referring: Ronnald Nian, MD Visit Date: 09/15/2023 Occupation: @GUAROCC @  Subjective:  Medication monitoring   History of Present Illness: Kimberly Butler is a 68 y.o. female with history of psoriatic arthritis and osteoarthritis.  Patient remains on Enbrel SureClick 50 mg sq injections every 7 days, methotrexate 0.3 ml sq injections every other week-due to hair loss (taking nutrafol), and folic acid 1mg  daily.  She is tolerating combination therapy without any side effects and has not had any recent gaps in therapy.  Patient denies any signs or symptoms of a psoriatic arthritis flare recently.  Patient has been trying to go to the gym at least 3 days a week and walking on the days that she is not at the gym for exercise.  Patient denies any joint swelling, morning stiffness, nocturnal pain at this time.  Patient states she has a few small scattered patches of psoriasis on her upper and lower extremities but has not been using any topical agents recently.  She denies any recent or recurrent infections.  She denies any new medical conditions.  Patient has an upcoming trip to [place] with her church in July 2025.  She has been trying to hike on a regular basis to improve her endurance.     Activities of Daily Living:  Patient reports morning stiffness for 0  none .   Patient Denies nocturnal pain.  Difficulty dressing/grooming: Denies Difficulty climbing stairs: Denies Difficulty getting out of chair: Denies Difficulty using hands for taps, buttons, cutlery, and/or writing: Denies  Review of Systems  Constitutional:  Negative for fatigue.  HENT:  Negative for mouth sores and mouth dryness.   Eyes:  Negative for dryness.  Respiratory:  Negative for shortness of breath.   Cardiovascular:  Negative for chest pain and palpitations.   Gastrointestinal:  Negative for blood in stool, constipation and diarrhea.  Endocrine: Negative for increased urination.  Genitourinary:  Negative for involuntary urination.  Musculoskeletal:  Negative for joint pain, gait problem, joint pain, joint swelling, myalgias, muscle weakness, morning stiffness, muscle tenderness and myalgias.  Skin:  Negative for color change, rash, hair loss and sensitivity to sunlight.  Allergic/Immunologic: Negative for susceptible to infections.  Neurological:  Negative for dizziness and headaches.  Hematological:  Negative for swollen glands.  Psychiatric/Behavioral:  Negative for depressed mood and sleep disturbance. The patient is not nervous/anxious.     PMFS History:  Patient Active Problem List   Diagnosis Date Noted   Family history of breast cancer 07/11/2021   History of COVID-19 07/31/2020   Lactose intolerance in adult 11/18/2016   Post-menopausal 08/28/2016   High risk medication use 08/28/2016   History of vitamin D deficiency 08/28/2016   History of gastroesophageal reflux (GERD) 08/28/2016   History of iron deficiency anemia 08/28/2016   Psoriasis 07/07/2014   Seizure disorder (HCC) 01/07/2011   Hypertension 01/07/2011   Psoriatic arthritis (HCC) 01/07/2011   Allergic rhinitis due to pollen 01/07/2011   Obesity (BMI 30-39.9) 01/07/2011    Past Medical History:  Diagnosis Date   Allergy    RHINITIS   Cancer (HCC)    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   Epilepsy (HCC)    hasn't had  seizure in 20 years (01/15/17)   GERD (gastroesophageal reflux disease)    Hypertension    Obesity    Psoriasis    Psoriatic arthritis (HCC)    Smoker    Varicose veins     Family History  Problem Relation Age of Onset   Cancer Mother    Heart disease Mother    Arthritis Father    Kidney failure Father    Dementia Father    Diabetes Father    Cancer Sister    Cancer Brother    Healthy Son    Past Surgical History:  Procedure Laterality Date   CATARACT  EXTRACTION W/ INTRAOCULAR LENS IMPLANT Bilateral    CESAREAN SECTION     CHOLECYSTECTOMY N/A 01/23/2017   Procedure: LAPAROSCOPIC CHOLECYSTECTOMY;  Surgeon: Violeta Gelinas, MD;  Location: [facility] OR;  Service: General;  Laterality: N/A;   TONSILECTOMY, ADENOIDECTOMY, BILATERAL MYRINGOTOMY AND TUBES     TUBAL LIGATION     Social History   Social History Narrative   Not on file   Immunization History  Administered Date(s) Administered   DTaP 02/06/1994, 11/15/2004   Fluad Quad(high Dose 65+) 05/23/2022   Influenza Inj Mdck Quad Pf 03/10/2017   Influenza Whole 07/24/2009   Influenza,inj,Quad PF,6+ Mos 03/29/2016, 03/24/2018, 03/22/2019, 03/22/2020   Influenza-Unspecified 03/10/2014, 03/10/2017   PNEUMOCOCCAL CONJUGATE-20 09/09/2023   PPD Test 10/20/1992   Pneumococcal Conjugate-13 12/18/2007   Pneumococcal Polysaccharide-23 03/31/2014   Tdap 03/29/2016   Zoster Recombinant(Shingrix) 12/15/2018, 03/22/2019     Objective: Vital Signs: BP 123/79 (BP Location: Left Arm, Patient Position: Sitting, Cuff Size: Normal)   Pulse (!) 57   Resp 15   Ht 5\' 7"  (1.702 m)   Wt 204 lb (92.5 kg)   BMI 31.95 kg/m    Physical Exam Vitals and nursing note reviewed.  Constitutional:      Appearance: She is well-developed.  HENT:     Head: Normocephalic and atraumatic.  Eyes:     Conjunctiva/sclera: Conjunctivae normal.  Cardiovascular:     Rate and Rhythm: Normal rate and regular rhythm.     Heart sounds: Normal heart sounds.  Pulmonary:     Effort: Pulmonary effort is normal.     Breath sounds: Normal breath sounds.  Abdominal:     General: Bowel sounds are normal.     Palpations: Abdomen is soft.  Musculoskeletal:     Cervical back: Normal range of motion.     Right lower leg: Edema present.  Skin:    General: Skin is warm and dry.     Capillary Refill: Capillary refill takes less than 2 seconds.  Neurological:     Mental Status: She is alert and oriented to person, place, and time.   Psychiatric:        Behavior: Behavior normal.      Musculoskeletal Exam: C-spine has good range of motion.  Thoracic kyphosis noted.  No midline spinal tenderness.  No SI joint tenderness.  Shoulder joints, elbow joints, wrist joints MCPs, PIPs, DIPs have good range of motion with no synovitis.  Complete fist formation bilaterally.  Hip joints have good range of motion with no groin pain.  Knee joints have good range of motion no warmth or effusion.  Edema noted in the right lower extremity.  No evidence of Achilles tendinitis or plantar fasciitis.  CDAI Exam: CDAI Score: -- Patient Global: --; Provider Global: -- Swollen: --; Tender: -- Joint Exam 09/15/2023   No joint exam has been documented for this  visit   There is currently no information documented on the homunculus. Go to the Rheumatology activity and complete the homunculus joint exam.  Investigation: No additional findings.  Imaging: No results found.  Recent Labs: Lab Results  Component Value Date   WBC 6.1 08/05/2023   HGB 11.7 08/05/2023   PLT 249 08/05/2023   NA 138 08/05/2023   K 4.0 08/05/2023   CL 101 08/05/2023   CO2 30 08/05/2023   GLUCOSE 78 08/05/2023   BUN 18 08/05/2023   CREATININE 0.84 08/05/2023   BILITOT 0.3 08/05/2023   ALKPHOS 102 01/06/2023   AST 21 08/05/2023   ALT 13 08/05/2023   PROT 7.1 08/05/2023   ALBUMIN 4.0 01/06/2023   CALCIUM 9.2 08/05/2023   GFRAA 85 11/01/2020   QFTBGOLDPLUS NEGATIVE 04/16/2023    Speciality Comments: No specialty comments available.  Procedures:  No procedures performed Allergies: Metoclopramide and Promethazine  . Assessment / Plan:     Visit Diagnoses: Psoriatic arthritis (HCC): She has no synovitis or dactylitis on examination today.  No evidence of Achilles tendinitis or plantar fasciitis.  No SI joint tenderness upon palpation.  She has not been experiencing any morning stiffness, nocturnal pain, or difficulty with ADLs.  Patient remains on Enbrel  50 mg sq injections once weekly, methotrexate 0.3 mL sq injections every other week, and folic acid 1 mg daily.  She is tolerating combination therapy without any side effects and has not had any recent gaps in therapy.  No recent or recurrent infections.  Patient has been going to the gym at least 3 days a week riding his stationary bike as well as walking for exercise.  No medication changes will be made at this time.  She was advised to notify us if she develops any signs or symptoms of a flare.  She will follow-up in the office in 5 months or sooner if needed.   Psoriasis: She has a few small scattered patches of psoriasis on her upper and lower extremities.  She has not needed to use any topical agents for symptomatic relief.  No medication changes will be made at this time.  High risk medication use - Enbrel SureClick 50 mg sq injections every 7 days, methotrexate 0.3 ml sq injections every other week-due to hair loss (taking nutrafol), and folic acid 1mg  daily.  CBC and CMP updated on 08/05/23.  Her next lab work will be due in May and every 3 months.  TB gold negative on 04/16/23.  Discussed the importance of holding enbrel and methotrexate if she develops signs or symptoms of an infection and to resume once the infection has completely cleared.   Primary osteoarthritis of both hands: She has PIP and DIP thickening consistent with osteoarthritis of both hands.  No active inflammation noted on exam.  Primary osteoarthritis of both knees: She has good range of motion of both knee joints on examination today.  No warmth or effusion noted.  She has been riding a stationary bike, walking, and hiking for exercise.  Other medical conditions are listed as follows:  Trochanteric bursitis, right hip: Not currently symptomatic.  Vitamin D deficiency: She is taking vitamin D 5000 units daily.  Essential hypertension: Blood pressure 123/79 today in the office.  History of gastroesophageal reflux  (GERD)  History of iron deficiency anemia  History of epilepsy  Orders: No orders of the defined types were placed in this encounter.  No orders of the defined types were placed in this encounter.  Follow-Up Instructions: Return in 5 months (on 02/15/2024) for Psoriatic arthritis, Osteoarthritis.   Gearldine Bienenstock, PA-C  Note - This record has been created using Dragon software.  Chart creation errors have been sought, but may not always  have been located. Such creation errors do not reflect on  the standard of medical care.

## 2023-09-03 ENCOUNTER — Telehealth: Payer: Self-pay | Admitting: *Deleted

## 2023-09-03 NOTE — Telephone Encounter (Signed)
 Patient contacted the office stating she had copies of a from she needs to email. Patient would like to know what email you would like for her to send them to. Please advsie patient at [phone].

## 2023-09-05 NOTE — Telephone Encounter (Signed)
 Faxed LIS denial letter to [employer] (patient sent pictures via MyChart)

## 2023-09-09 ENCOUNTER — Ambulatory Visit: Payer: No Typology Code available for payment source | Admitting: Family Medicine

## 2023-09-09 ENCOUNTER — Encounter: Payer: Self-pay | Admitting: Family Medicine

## 2023-09-09 VITALS — BP 130/80 | HR 48 | Ht 65.0 in | Wt 205.6 lb

## 2023-09-09 DIAGNOSIS — Z8639 Personal history of other endocrine, nutritional and metabolic disease: Secondary | ICD-10-CM

## 2023-09-09 DIAGNOSIS — J301 Allergic rhinitis due to pollen: Secondary | ICD-10-CM

## 2023-09-09 DIAGNOSIS — Z0000 Encounter for general adult medical examination without abnormal findings: Secondary | ICD-10-CM

## 2023-09-09 DIAGNOSIS — Z79899 Other long term (current) drug therapy: Secondary | ICD-10-CM | POA: Diagnosis not present

## 2023-09-09 DIAGNOSIS — G40909 Epilepsy, unspecified, not intractable, without status epilepticus: Secondary | ICD-10-CM

## 2023-09-09 DIAGNOSIS — Z13220 Encounter for screening for lipoid disorders: Secondary | ICD-10-CM

## 2023-09-09 DIAGNOSIS — L4050 Arthropathic psoriasis, unspecified: Secondary | ICD-10-CM | POA: Diagnosis not present

## 2023-09-09 DIAGNOSIS — I10 Essential (primary) hypertension: Secondary | ICD-10-CM

## 2023-09-09 DIAGNOSIS — E739 Lactose intolerance, unspecified: Secondary | ICD-10-CM

## 2023-09-09 DIAGNOSIS — Z23 Encounter for immunization: Secondary | ICD-10-CM

## 2023-09-09 DIAGNOSIS — Z8719 Personal history of other diseases of the digestive system: Secondary | ICD-10-CM

## 2023-09-09 DIAGNOSIS — Z862 Personal history of diseases of the blood and blood-forming organs and certain disorders involving the immune mechanism: Secondary | ICD-10-CM

## 2023-09-09 DIAGNOSIS — E669 Obesity, unspecified: Secondary | ICD-10-CM

## 2023-09-09 DIAGNOSIS — Z803 Family history of malignant neoplasm of breast: Secondary | ICD-10-CM

## 2023-09-09 MED ORDER — PHENYTOIN SODIUM EXTENDED 100 MG PO CAPS: ORAL_CAPSULE | ORAL | 3 refills | Status: AC

## 2023-09-09 MED ORDER — LOSARTAN POTASSIUM-HCTZ 50-12.5 MG PO TABS: 1.0000 | ORAL_TABLET | Freq: Every day | ORAL | 3 refills | Status: AC

## 2023-09-09 NOTE — Progress Notes (Signed)
 Kimberly Butler is a 68 y.o. female who presents for welcome to Medicare and follow-up on chronic medical conditions.  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  She and her son are about to travel and plan to do more travel next year.  She follows up with rheumatology and presently is on methotrexate as well as Enbrel and folate.  She has a seizure disorder and has been very stable on Dilantin for several years.  Her allergies are under good control.  She does have a mammogram set up for later this year.  Her reflux is not causing any difficulty.  She continues on losartan/HCTZ without difficulty.  She does have a history of lactose intolerance.  She does not smoke or drink.  Immunizations and Health Maintenance Immunization History  Administered Date(s) Administered   DTaP 02/06/1994, 11/15/2004   Fluad Quad(high Dose 65+) 05/23/2022   Influenza Inj Mdck Quad Pf 03/10/2017   Influenza Whole 07/24/2009   Influenza,inj,Quad PF,6+ Mos 03/29/2016, 03/24/2018, 03/22/2019, 03/22/2020   Influenza-Unspecified 03/10/2014, 03/10/2017   PPD Test 10/20/1992   Pneumococcal Conjugate-13 12/18/2007   Pneumococcal Polysaccharide-23 03/31/2014   Tdap 03/29/2016   Zoster Recombinant(Shingrix) 12/15/2018, 03/22/2019   There are no preventive care reminders to display for this patient.   Last Pap smear:2022 Last mammogram:01/2023  Last colonoscopy:2010 Cologuard in 2023 Last DEXA:2017 Dentist:2 weeks ago Ophtho:03/2023 Exercise:7 days a week, goes to gym 3 days a week and hikes the other days  Other doctors caring for patient include: GI-Dr. Elnoria Howard Rheum-Dr.Deveshwar Dentist-Dr.[facility]   Advanced directives: Does Patient Have a Medical Advance Directive?: No Would patient like information on creating a medical advance directive?: Yes (MAU/Ambulatory/Procedural Areas - Information  given)No.  She plans to work with her lawyer to get an advanced directive.  Depression screen:  See questionnaire below.     09/09/2023    2:03 PM 07/18/2022    9:27 AM 07/11/2021    8:48 AM 07/31/2020   10:35 AM 12/15/2018    9:15 AM  Depression screen PHQ 2/9  Decreased Interest 0 0 0 0 0  Down, Depressed, Hopeless 0 0 0 0 0  PHQ - 2 Score 0 0 0 0 0    Fall Risk Screen: see questionnaire below.    09/09/2023    2:03 PM 07/09/2022    1:26 PM 07/11/2021    8:48 AM 07/31/2020   10:34 AM 07/07/2017   10:33 AM  Fall Risk   Falls in the past year? 0 0 0 0 No  Number falls in past yr: 0 0 0 0   Injury with Fall? 0 0 0 0   Risk for fall due to : No Fall Risks No Fall Risks No Fall Risks No Fall Risks   Follow up Falls evaluation completed Falls evaluation completed Falls evaluation completed Falls evaluation completed     ADL screen:  See questionnaire below Functional Status Survey: Is the patient deaf or have difficulty hearing?: No Does the patient have difficulty seeing, even when wearing glasses/contacts?: No Does the patient have difficulty concentrating, remembering, or making decisions?: No Does the patient have difficulty walking or climbing stairs?: No Does the patient have difficulty dressing or bathing?: No Does the patient have difficulty doing errands alone such as visiting a doctor's office or shopping?: No   Review of Systems Constitutional: -, -unexpected weight  change, -anorexia, -fatigue Allergy: -sneezing, -itching, -congestion Dermatology: denies changing moles, rash, lumps ENT: -runny nose, -ear pain, -sore throat,  Cardiology:  -chest pain, -palpitations, -orthopnea, Respiratory: -cough, -shortness of breath, -dyspnea on exertion, -wheezing,  Gastroenterology: -abdominal pain, -nausea, -vomiting, -diarrhea, -constipation, -dysphagia Hematology: -bleeding or bruising problems Musculoskeletal: -arthralgias, -myalgias, -joint swelling, -back pain, - Ophthalmology:  -vision changes,  Urology: -dysuria, -difficulty urinating,  -urinary frequency, -urgency, incontinence Neurology: -, -numbness, , -memory loss, -falls, -dizziness    PHYSICAL EXAM:    General Appearance: Alert, cooperative, no distress, appears stated age Head: Normocephalic, without obvious abnormality, atraumatic Eyes: PERRL, conjunctiva/corneas clear, EOM's intact,  Ears: Normal TM's and external ear canals Nose: Nares normal, mucosa normal, no drainage or sinus tenderness Throat: Lips, mucosa, and tongue normal; teeth and gums normal Neck: Supple, no lymphadenopathy;  thyroid:  no enlargement/tenderness/nodules; no carotid bruit or JVD Lungs: Clear to auscultation bilaterally without wheezes, rales or ronchi; respirations unlabored Heart: Regular rate and rhythm, S1 and S2 normal, no murmur, rubor gallop Abdomen: Soft, non-tender, nondistended, normoactive bowel sounds,  no masses, no hepatosplenomegaly Skin:  Skin color, texture, turgor normal, no rashes or lesions Lymph nodes: Cervical, supraclavicular, and axillary nodes normal Neurologic:  CNII-XII intact, normal strength, sensation and gait; reflexes 2+ and symmetric throughout Psych: Normal mood, affect, hygiene and grooming.  ASSESSMENT/PLAN: Psoriatic arthritis (HCC)  Seasonal allergic rhinitis due to pollen  High risk medication use  History of gastroesophageal reflux (GERD)  History of iron deficiency anemia  Primary hypertension - Plan: losartan-hydrochlorothiazide (HYZAAR) 50-12.5 MG tablet  Lactose intolerance in adult  Obesity (BMI 30-39.9)  Seizure disorder (HCC) - Plan: phenytoin (DILANTIN) 100 MG ER capsule, Phenytoin level, free and total, CANCELED: Phenytoin level, free and total  Need for pneumococcal 20-valent conjugate vaccination - Plan: Pneumococcal conjugate vaccine 20-valent (Prevnar 20)  Screening for lipid disorders - Plan: Lipid Panel, CANCELED: Lipid panel  Family history of breast  cancer    Discussed Immunization recommendations   Colonoscopy recommendations reviewed.  Encouraged her to remain physically busy and take care of herself intellectually as well as psychologically.  Recheck here in 1 year.   Medicare Attestation I have personally reviewed: The patient's medical and social history Their use of alcohol, tobacco or illicit drugs Their current medications and supplements The patient's functional ability including ADLs,fall risks, home safety risks, cognitive, and hearing and visual impairment Diet and physical activities Evidence for depression or mood disorders  The patient's weight, height, and BMI have been recorded in the chart.  I have made referrals, counseling, and provided education to the patient based on review of the above and I have provided the patient with a written personalized care plan for preventive services.     Sharlot Gowda, MD   09/09/2023

## 2023-09-09 NOTE — Patient Instructions (Signed)
  Ms. Gladwell , Thank you for taking time to come for your Medicare Wellness Visit. I appreciate your ongoing commitment to your health goals. Please review the following plan we discussed and let me know if I can assist you in the future.   These are the goals we discussed:  Goals   None     This is a list of the screening recommended for you and due dates:  Health Maintenance  Topic Date Due   COVID-19 Vaccine (1) 09/25/2023*   Mammogram  01/09/2024   Medicare Annual Wellness Visit  09/08/2024   Cologuard (Stool DNA test)  01/07/2025   DTaP/Tdap/Td vaccine (4 - Td or Tdap) 03/29/2026   DEXA scan (bone density measurement)  Completed   Hepatitis C Screening  Completed   Zoster (Shingles) Vaccine  Completed   HPV Vaccine  Aged Out   Pneumonia Vaccine  Discontinued   Colon Cancer Screening  Discontinued  *Topic was postponed. The date shown is not the original due date.

## 2023-09-11 LAB — PHENYTOIN LEVEL, FREE AND TOTAL: Phenytoin, Serum: 3.3 ug/mL — ABNORMAL LOW (ref 10.0–20.0)

## 2023-09-14 ENCOUNTER — Other Ambulatory Visit: Payer: Self-pay | Admitting: Rheumatology

## 2023-09-14 DIAGNOSIS — Z79899 Other long term (current) drug therapy: Secondary | ICD-10-CM

## 2023-09-14 DIAGNOSIS — L4050 Arthropathic psoriasis, unspecified: Secondary | ICD-10-CM

## 2023-09-14 DIAGNOSIS — L409 Psoriasis, unspecified: Secondary | ICD-10-CM

## 2023-09-15 ENCOUNTER — Encounter: Payer: Self-pay | Admitting: Physician Assistant

## 2023-09-15 ENCOUNTER — Telehealth: Payer: Self-pay

## 2023-09-15 ENCOUNTER — Ambulatory Visit: Payer: No Typology Code available for payment source | Attending: Physician Assistant | Admitting: Physician Assistant

## 2023-09-15 VITALS — BP 123/79 | HR 57 | Resp 15 | Ht 67.0 in | Wt 204.0 lb

## 2023-09-15 DIAGNOSIS — Z8719 Personal history of other diseases of the digestive system: Secondary | ICD-10-CM

## 2023-09-15 DIAGNOSIS — M19041 Primary osteoarthritis, right hand: Secondary | ICD-10-CM | POA: Diagnosis not present

## 2023-09-15 DIAGNOSIS — E559 Vitamin D deficiency, unspecified: Secondary | ICD-10-CM

## 2023-09-15 DIAGNOSIS — L4050 Arthropathic psoriasis, unspecified: Secondary | ICD-10-CM

## 2023-09-15 DIAGNOSIS — I10 Essential (primary) hypertension: Secondary | ICD-10-CM | POA: Diagnosis present

## 2023-09-15 DIAGNOSIS — M170 Bilateral primary osteoarthritis of knee: Secondary | ICD-10-CM

## 2023-09-15 DIAGNOSIS — M19042 Primary osteoarthritis, left hand: Secondary | ICD-10-CM | POA: Diagnosis present

## 2023-09-15 DIAGNOSIS — M7061 Trochanteric bursitis, right hip: Secondary | ICD-10-CM | POA: Diagnosis present

## 2023-09-15 DIAGNOSIS — Z862 Personal history of diseases of the blood and blood-forming organs and certain disorders involving the immune mechanism: Secondary | ICD-10-CM | POA: Diagnosis present

## 2023-09-15 DIAGNOSIS — Z8669 Personal history of other diseases of the nervous system and sense organs: Secondary | ICD-10-CM | POA: Diagnosis present

## 2023-09-15 DIAGNOSIS — Z79899 Other long term (current) drug therapy: Secondary | ICD-10-CM

## 2023-09-15 DIAGNOSIS — L409 Psoriasis, unspecified: Secondary | ICD-10-CM

## 2023-09-15 NOTE — Telephone Encounter (Signed)
 Patient contacted the office and states she was advised by her pharmacy that her Enbrel refill was not approved. Advised the patient her Enbrel was filled on 07/15/2023 and is not due to be filled again until the beginning of May. Advised the patient the prescription was probably denied because it was sent to [place] early. Patient verbalized understanding.

## 2023-09-15 NOTE — Patient Instructions (Addendum)
 Standing Labs We placed an order today for your standing lab work.   Please have your standing labs drawn at end of may and every 3 months   Please have your labs drawn 2 weeks prior to your appointment so that the provider can discuss your lab results at your appointment, if possible.  Please note that you may see your imaging and lab results in MyChart before we have reviewed them. We will contact you once all results are reviewed. Please allow our office up to 72 hours to thoroughly review all of the results before contacting the office for clarification of your results.  WALK-IN LAB HOURS  Monday through Thursday from 8:00 am -12:30 pm and 1:00 pm-5:00 pm and Friday from 8:00 am-12:00 pm.  Patients with office visits requiring labs will be seen before walk-in labs.  You may encounter longer than normal wait times. Please allow additional time. Wait times may be shorter on  Monday and Thursday afternoons.  We do not book appointments for walk-in labs. We appreciate your patience and understanding with our staff.   Labs are drawn by [facility]. Please bring your co-pay at the time of your lab draw.  You may receive a bill from [facility] for your lab work.  Please note if you are on Hydroxychloroquine and and an order has been placed for a Hydroxychloroquine level,  you will need to have it drawn 4 hours or more after your last dose.  If you wish to have your labs drawn at another location, please call the office 24 hours in advance so we can fax the orders.  The office is located at [address], [address], [place], [place] [address]   If you have any questions regarding directions or hours of operation,  please call [phone].   As a reminder, please drink plenty of water prior to coming for your lab work. Thanks!

## 2023-09-15 NOTE — Addendum Note (Signed)
 Addended by: Geroge Baseman C on: 09/15/2023 11:58 AM   Modules accepted: Orders

## 2023-09-24 ENCOUNTER — Other Ambulatory Visit: Payer: Self-pay | Admitting: Rheumatology

## 2023-09-24 DIAGNOSIS — L409 Psoriasis, unspecified: Secondary | ICD-10-CM

## 2023-09-24 DIAGNOSIS — Z79899 Other long term (current) drug therapy: Secondary | ICD-10-CM

## 2023-09-24 DIAGNOSIS — L4050 Arthropathic psoriasis, unspecified: Secondary | ICD-10-CM

## 2023-09-24 NOTE — Telephone Encounter (Signed)
 Last Fill: 07/15/2023  Labs: 08/05/2023 CBC and CMP normal.   TB Gold: 04/16/2023 Neg   Next Visit: 02/17/2024  Last Visit: 09/15/2023  DX: Psoriatic arthritis    Current Dose per office note 09/15/2023: Enbrel SureClick 50 mg sq injections every 7 days   Okay to refill Enbrel?

## 2023-10-14 NOTE — Telephone Encounter (Signed)
 Called [employer] for update on Enbrel  Patient Assistance Application program. Per representative the document that was sent previously was a predetermination notice. Their program requires a final denial letter from [facility] [employer] in order to re-open the case.   Spoke with patient to discuss this. She states that she received an official denial letter about a week ago. She agreed to send a copy of this document later this afternoon.   [employer] Phone #: [phone] Fax #: [phone]  Kimberly Butler, PharmD [facility] PGY-1

## 2023-10-16 NOTE — Telephone Encounter (Signed)
 Received LIS denial from patient via email. Forwarded to [employer] for review. Also forwarded to [employer] for retention  Geraldene Kleine, PharmD, MPH, BCPS, CPP Clinical Pharmacist (Rheumatology and Pulmonology)

## 2023-10-23 NOTE — Telephone Encounter (Signed)
 Spoke with [employer] to discuss status of Enbrel  PAP application. Per representative her application has been closed since February due to the need for the LIS application denial to move forward.   Case was reopened and representative confirmed that they did receive the denial letter. They will re-review case and inform patient and clinic with final decision.   [employer] Phone #: [phone] Fax #: [phone]  Kimberly Butler, PharmD [facility] PGY-1

## 2023-10-27 NOTE — Telephone Encounter (Signed)
 Spoke with [employer] Safety Application to check status of ENBREL  PAP application. Per representative they are still awaiting a final decision. They do have a prescription on file, ready to go out if application is approved. Representative stated that a final decision should be reached by the of this week.   [employer] Phone #: [phone] Fax #: [phone]  Tolu Shyrl Obi, PharmD [facility] PGY1

## 2023-10-29 NOTE — Telephone Encounter (Signed)
 Received a fax from  [employer] regarding an approval for ENBREL  patient assistance from 10/28/2023 to 06/09/2024. Approval letter sent to scan center.  Phone #: [phone] Fax #: [phone]  Patient notified via myChart of approval  Geraldene Kleine, PharmD, MPH, BCPS, CPP Clinical Pharmacist (Rheumatology and Pulmonology)

## 2023-12-09 ENCOUNTER — Other Ambulatory Visit: Payer: Self-pay | Admitting: *Deleted

## 2023-12-09 DIAGNOSIS — Z79899 Other long term (current) drug therapy: Secondary | ICD-10-CM

## 2023-12-09 LAB — CBC WITH DIFFERENTIAL/PLATELET
Absolute Lymphocytes: 2313 {cells}/uL (ref 850–3900)
Absolute Monocytes: 560 {cells}/uL (ref 200–950)
Basophils Absolute: 39 {cells}/uL (ref 0–200)
Basophils Relative: 0.7 %
Eosinophils Absolute: 140 {cells}/uL (ref 15–500)
Eosinophils Relative: 2.5 %
HCT: 36.1 % (ref 35.0–45.0)
Hemoglobin: 11.9 g/dL (ref 11.7–15.5)
MCH: 31.6 pg (ref 27.0–33.0)
MCHC: 33 g/dL (ref 32.0–36.0)
MCV: 95.8 fL (ref 80.0–100.0)
MPV: 9.7 fL (ref 7.5–12.5)
Monocytes Relative: 10 %
Neutro Abs: 2548 {cells}/uL (ref 1500–7800)
Neutrophils Relative %: 45.5 %
Platelets: 245 10*3/uL (ref 140–400)
RBC: 3.77 10*6/uL — ABNORMAL LOW (ref 3.80–5.10)
RDW: 12.5 % (ref 11.0–15.0)
Total Lymphocyte: 41.3 %
WBC: 5.6 10*3/uL (ref 3.8–10.8)

## 2023-12-09 LAB — COMPREHENSIVE METABOLIC PANEL WITH GFR
AG Ratio: 1.6 (calc) (ref 1.0–2.5)
ALT: 15 U/L (ref 6–29)
AST: 22 U/L (ref 10–35)
Albumin: 4 g/dL (ref 3.6–5.1)
Alkaline phosphatase (APISO): 68 U/L (ref 37–153)
BUN: 19 mg/dL (ref 7–25)
CO2: 30 mmol/L (ref 20–32)
Calcium: 9.4 mg/dL (ref 8.6–10.4)
Chloride: 105 mmol/L (ref 98–110)
Creat: 0.61 mg/dL (ref 0.50–1.05)
Globulin: 2.5 g/dL (ref 1.9–3.7)
Glucose, Bld: 87 mg/dL (ref 65–99)
Potassium: 4.4 mmol/L (ref 3.5–5.3)
Sodium: 140 mmol/L (ref 135–146)
Total Bilirubin: 0.3 mg/dL (ref 0.2–1.2)
Total Protein: 6.5 g/dL (ref 6.1–8.1)
eGFR: 97 mL/min/{1.73_m2} (ref >=60)

## 2023-12-10 ENCOUNTER — Ambulatory Visit: Payer: Self-pay | Admitting: Physician Assistant

## 2023-12-10 NOTE — Progress Notes (Signed)
 CMP WNL RBC count remains slightly low but has improved. Rest of CBC WNL.

## 2023-12-22 ENCOUNTER — Other Ambulatory Visit: Payer: Self-pay

## 2023-12-22 DIAGNOSIS — L409 Psoriasis, unspecified: Secondary | ICD-10-CM

## 2023-12-22 DIAGNOSIS — L4050 Arthropathic psoriasis, unspecified: Secondary | ICD-10-CM

## 2023-12-22 DIAGNOSIS — Z79899 Other long term (current) drug therapy: Secondary | ICD-10-CM

## 2023-12-22 NOTE — Telephone Encounter (Signed)
 Last Fill: 08/04/2023  Labs: 12/09/2023 CMP WNL RBC count remains slightly low but has improved. Rest of CBC WNL.  Next Visit: 02/17/2024  Last Visit: 09/15/2023  DX: Psoriatic arthritis   Current Dose per office note 09/15/2023: methotrexate  0.3 ml sq injections every other week   Okay to refill Methotrexate ?

## 2023-12-22 NOTE — Telephone Encounter (Signed)
 Last Fill: 09/24/2023  Labs: 12/09/2023 CMP WNL RBC count remains slightly low but has improved. Rest of CBC WNL.  TB Gold: 04/15/2024 TB Gold Negative    Next Visit: 02/17/2024  Last Visit: 09/15/2023  DX: Psoriatic arthritis   Current Dose per office note 09/15/2023: Enbrel  SureClick 50 mg sq injections every 7 days   Okay to refill Enbrel ?

## 2023-12-23 MED ORDER — METHOTREXATE SODIUM CHEMO INJECTION (PF) 50 MG/2ML: INTRAMUSCULAR | 0 refills | Status: DC

## 2023-12-23 MED ORDER — ENBREL SURECLICK 50 MG/ML ~~LOC~~ SOAJ: SUBCUTANEOUS | 0 refills | Status: DC

## 2024-01-05 ENCOUNTER — Telehealth: Payer: Self-pay | Admitting: *Deleted

## 2024-01-05 NOTE — Telephone Encounter (Signed)
 Patient called stating she needs prior auth for methotrexate . Thank you.

## 2024-01-08 NOTE — Telephone Encounter (Signed)
 Submitted a Prior Authorization request to [facility] for Methotrexate  via CoverMyMeds. Will update once we receive a response.

## 2024-01-13 NOTE — Telephone Encounter (Signed)
 Received notification from [employer] regarding a prior authorization for Methotrexate . Authorization has been APPROVED from 12/30/2023 to further notice.    Authorization # [id number] Phone # [phone]

## 2024-01-15 LAB — HM MAMMOGRAPHY

## 2024-02-03 NOTE — Progress Notes (Unsigned)
 Office Visit Note  Patient: Kimberly Butler             Date of Birth: 07/29/1955           MRN: [id number]             PCP: Joyce Norleen BROCKS, MD Referring: Joyce Norleen BROCKS, MD Visit Date: 02/17/2024 Occupation: @GUAROCC @  Subjective:  Medication monitoring   History of Present Illness: Kimberly Butler is a 68 y.o. female with history of psoriatic arthritis and osteoarthritis.  Patient remains on Enbrel  SureClick 50 mg sq injections every 7 days, methotrexate  0.3 ml sq injections every other week-due to hair loss (taking nutrafol), and folic acid  1mg  daily.  She is tolerating combination therapy without any side effects and has not had any recent gaps in therapy.  Patient states that she recently had a flare of psoriasis with patches on both forearms.  Patient would like to discuss possibly increasing the dose of methotrexate  back to weekly dosing.  She plans on also scheduling a visit with dermatology. She denies any increased joint pain or joint swelling.  She denies any morning stiffness, nocturnal pain, or difficulty performing ADLs.    Activities of Daily Living:  Patient reports morning stiffness for 0 minute.   Patient Denies nocturnal pain.  Difficulty dressing/grooming: Denies Difficulty climbing stairs: Denies Difficulty getting out of chair: Denies Difficulty using hands for taps, buttons, cutlery, and/or writing: Reports  Review of Systems  Constitutional:  Negative for fatigue.  HENT:  Negative for mouth sores and mouth dryness.   Eyes:  Negative for dryness.  Respiratory:  Negative for shortness of breath.   Cardiovascular:  Negative for chest pain and palpitations.  Gastrointestinal:  Negative for blood in stool, constipation and diarrhea.  Endocrine: Negative for increased urination.  Genitourinary:  Negative for involuntary urination.  Musculoskeletal:  Positive for joint pain, joint pain, joint swelling, myalgias and myalgias. Negative for gait problem, muscle  weakness, morning stiffness and muscle tenderness.  Skin:  Positive for rash and sensitivity to sunlight. Negative for color change and hair loss.  Allergic/Immunologic: Negative for susceptible to infections.  Neurological:  Negative for dizziness and headaches.  Hematological:  Negative for swollen glands.  Psychiatric/Behavioral:  Negative for depressed mood and sleep disturbance. The patient is not nervous/anxious.     PMFS History:  Patient Active Problem List   Diagnosis Date Noted   Family history of breast cancer 07/11/2021   History of COVID-19 07/31/2020   Lactose intolerance in adult 11/18/2016   Post-menopausal 08/28/2016   High risk medication use 08/28/2016   History of vitamin D  deficiency 08/28/2016   History of gastroesophageal reflux (GERD) 08/28/2016   History of iron deficiency anemia 08/28/2016   Psoriasis 07/07/2014   Seizure disorder (HCC) 01/07/2011   Hypertension 01/07/2011   Psoriatic arthritis (HCC) 01/07/2011   Allergic rhinitis due to pollen 01/07/2011   Obesity (BMI 30-39.9) 01/07/2011    Past Medical History:  Diagnosis Date   Allergy     RHINITIS   Cancer (HCC)    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   Epilepsy (HCC)    hasn't had seizure in 20 years (01/15/17)   GERD (gastroesophageal reflux disease)    Hypertension    Obesity    Psoriasis    Psoriatic arthritis (HCC)    Smoker    Varicose veins     Family History  Problem Relation Age of Onset   Cancer Mother    Heart disease Mother  Arthritis Father    Kidney failure Father    Dementia Father    Diabetes Father    Cancer Sister    Cancer Brother    Healthy Son    Past Surgical History:  Procedure Laterality Date   CATARACT EXTRACTION W/ INTRAOCULAR LENS IMPLANT Bilateral    CESAREAN SECTION     CHOLECYSTECTOMY N/A 01/23/2017   Procedure: LAPAROSCOPIC CHOLECYSTECTOMY;  Surgeon: Sebastian Moles, MD;  Location: [facility] OR;  Service: General;  Laterality: N/A;   TONSILECTOMY, ADENOIDECTOMY, BILATERAL  MYRINGOTOMY AND TUBES     TUBAL LIGATION     Social History   Social History Narrative   Not on file   Immunization History  Administered Date(s) Administered   DTaP 02/06/1994, 11/15/2004   Fluad Quad(high Dose 65+) 05/23/2022   Influenza Inj Mdck Quad Pf 03/10/2017   Influenza Whole 07/24/2009   Influenza,inj,Quad PF,6+ Mos 03/29/2016, 03/24/2018, 03/22/2019, 03/22/2020   Influenza-Unspecified 03/10/2014, 03/10/2017   PNEUMOCOCCAL CONJUGATE-20 09/09/2023   PPD Test 10/20/1992   Pneumococcal Conjugate-13 12/18/2007   Pneumococcal Polysaccharide-23 03/31/2014   Tdap 03/29/2016   Zoster Recombinant(Shingrix ) 12/15/2018, 03/22/2019     Objective: Vital Signs: BP 137/82 (BP Location: Left Arm, Patient Position: Sitting, Cuff Size: Normal)   Pulse (!) 55   Resp 14   Ht 5' 6 (1.676 m)   Wt 200 lb (90.7 kg)   BMI 32.28 kg/m    Physical Exam Vitals and nursing note reviewed.  Constitutional:      Appearance: She is well-developed.  HENT:     Head: Normocephalic and atraumatic.  Eyes:     Conjunctiva/sclera: Conjunctivae normal.  Cardiovascular:     Rate and Rhythm: Normal rate and regular rhythm.     Heart sounds: Normal heart sounds.  Pulmonary:     Effort: Pulmonary effort is normal.     Breath sounds: Normal breath sounds.  Abdominal:     General: Bowel sounds are normal.     Palpations: Abdomen is soft.  Musculoskeletal:     Cervical back: Normal range of motion.  Lymphadenopathy:     Cervical: No cervical adenopathy.  Skin:    General: Skin is warm and dry.     Capillary Refill: Capillary refill takes less than 2 seconds.  Neurological:     Mental Status: She is alert and oriented to person, place, and time.  Psychiatric:        Behavior: Behavior normal.      Musculoskeletal Exam: C-spine has good ROM. Thoracic kyphosis noted. lumbar spine have good range of motion.  No midline spinal tenderness.  No SI joint tenderness.  Shoulder joints, elbow joints,  wrist joints, MCPs, PIPs, DIPs have good range of motion with no synovitis.  Complete fist formation bilaterally.  Hip joints have good range of motion with no groin pain.  Knee joints have good range of motion no warmth or effusion.  Ankle joints have good range of motion no tenderness or joint swelling.  No evidence of Achilles tendinitis or plantar fasciitis.   CDAI Exam: CDAI Score: -- Patient Global: --; Provider Global: -- Swollen: --; Tender: -- Joint Exam 02/17/2024   No joint exam has been documented for this visit   There is currently no information documented on the homunculus. Go to the Rheumatology activity and complete the homunculus joint exam.  Investigation: No additional findings.  Imaging: No results found.  Recent Labs: Lab Results  Component Value Date   WBC 5.6 12/09/2023   HGB 11.9 12/09/2023  PLT 245 12/09/2023   NA 140 12/09/2023   K 4.4 12/09/2023   CL 105 12/09/2023   CO2 30 12/09/2023   GLUCOSE 87 12/09/2023   BUN 19 12/09/2023   CREATININE 0.61 12/09/2023   BILITOT 0.3 12/09/2023   ALKPHOS 102 01/06/2023   AST 22 12/09/2023   ALT 15 12/09/2023   PROT 6.5 12/09/2023   ALBUMIN 4.0 01/06/2023   CALCIUM 9.4 12/09/2023   GFRAA 85 11/01/2020   QFTBGOLDPLUS NEGATIVE 04/16/2023    Speciality Comments: No specialty comments available.  Procedures:  No procedures performed Allergies: Metoclopramide  and Promethazine      Assessment / Plan:     Visit Diagnoses: Psoriatic arthritis (HCC): She has no synovitis or dactylitis on examination today.  No evidence of Achilles tendinitis or plantar fasciitis.  No SI joint tenderness upon palpation.  She has been trying to remain active hiking and walking for exercise.  Overall her symptoms of inflammatory arthritis have been well-controlled on Enbrel  50 mg sq injections once weekly and methotrexate  0.3 ml sq injections once every other week.  According to the patient she had a recent flare of  psoriasis--recommended evaluation by dermatology prior to increasing the frequency of methotrexate  to once weekly.  She will remain on Enbrel  50 mg subcu days injections once weekly.  She was advised to notify us  if she develops any new or worsening symptoms. She will follow up in 5 months or sooner if needed.   Psoriasis: Under the care of dermatology.  According to the patient she had a recent flare of psoriasis involving both forearms.  Discussed that she has skin changes on the forearms consistent with sun damage--advised patient to schedule a follow-up visit with dermatology for further evaluation prior to increasing the dosing of methotrexate . Discussed the increased risk for skin cancer in patients on TNF inhibitor such as Enbrel .  She will continue to require at least annual skin cancer screening.  High risk medication use - Enbrel  SureClick 50 mg sq injections every 7 days, methotrexate  0.3 ml sq injections every week, folic acid  1mg  daily. Plan to increase the frequency of methotrexate  dosing to weekly dosing.  CBC and CMP updated on 12/09/23. Her next lab work will be due in October and every 3 months.   TB gold negative on 04/16/23. No recent or recurrent infections. Discussed the importance of holding enbrel  and methotrexate  if she develops signs or symptoms of an infection and to resume once the infection ddhas completely cleared.   Primary osteoarthritis of both hands: PIP and DIP thickening consistent with osteoarthritis of both hands.  No synovitis noted.  Primary osteoarthritis of both knees: She has good range of motion of both knee joints on examination today.  No warmth or effusion noted.  She has been walking and hiking on a regular basis for exercise.  Trochanteric bursitis, right hip: Not currently symptomatic.  Vitamin D  deficiency: She is taking vitamin D  5000 units daily.  Other medical conditions are listed as follows:  Essential hypertension: Blood pressure was 137/82  today in the office.  History of gastroesophageal reflux (GERD)  History of iron deficiency anemia  History of epilepsy  Orders: No orders of the defined types were placed in this encounter.  No orders of the defined types were placed in this encounter.    Follow-Up Instructions: Return in about 5 months (around 07/19/2024) for Psoriatic arthritis, Osteoarthritis.   Waddell CHRISTELLA Craze, PA-C  Note - This record has been created using Dragon software.  Chart creation errors have been sought, but may not always  have been located. Such creation errors do not reflect on  the standard of medical care.

## 2024-02-17 ENCOUNTER — Ambulatory Visit: Attending: Physician Assistant | Admitting: Physician Assistant

## 2024-02-17 ENCOUNTER — Other Ambulatory Visit: Payer: Self-pay

## 2024-02-17 ENCOUNTER — Encounter: Payer: Self-pay | Admitting: Physician Assistant

## 2024-02-17 VITALS — BP 137/82 | HR 55 | Resp 14 | Ht 66.0 in | Wt 200.0 lb

## 2024-02-17 DIAGNOSIS — Z79899 Other long term (current) drug therapy: Secondary | ICD-10-CM | POA: Diagnosis not present

## 2024-02-17 DIAGNOSIS — M170 Bilateral primary osteoarthritis of knee: Secondary | ICD-10-CM | POA: Diagnosis present

## 2024-02-17 DIAGNOSIS — Z862 Personal history of diseases of the blood and blood-forming organs and certain disorders involving the immune mechanism: Secondary | ICD-10-CM

## 2024-02-17 DIAGNOSIS — M7061 Trochanteric bursitis, right hip: Secondary | ICD-10-CM | POA: Diagnosis present

## 2024-02-17 DIAGNOSIS — L4050 Arthropathic psoriasis, unspecified: Secondary | ICD-10-CM

## 2024-02-17 DIAGNOSIS — Z8669 Personal history of other diseases of the nervous system and sense organs: Secondary | ICD-10-CM

## 2024-02-17 DIAGNOSIS — M19042 Primary osteoarthritis, left hand: Secondary | ICD-10-CM

## 2024-02-17 DIAGNOSIS — Z8719 Personal history of other diseases of the digestive system: Secondary | ICD-10-CM

## 2024-02-17 DIAGNOSIS — E559 Vitamin D deficiency, unspecified: Secondary | ICD-10-CM | POA: Diagnosis present

## 2024-02-17 DIAGNOSIS — I10 Essential (primary) hypertension: Secondary | ICD-10-CM

## 2024-02-17 DIAGNOSIS — L409 Psoriasis, unspecified: Secondary | ICD-10-CM

## 2024-02-17 DIAGNOSIS — M19041 Primary osteoarthritis, right hand: Secondary | ICD-10-CM

## 2024-02-17 MED ORDER — METHOTREXATE SODIUM CHEMO INJECTION (PF) 50 MG/2ML: INTRAMUSCULAR | 2 refills | Status: DC

## 2024-02-17 NOTE — Patient Instructions (Signed)
 Standing Labs We placed an order today for your standing lab work.   Please have your standing labs drawn in October and every 3 months  Please have your labs drawn 2 weeks prior to your appointment so that the provider can discuss your lab results at your appointment, if possible.  Please note that you Paulino see your imaging and lab results in MyChart before we have reviewed them. We will contact you once all results are reviewed. Please allow our office up to 72 hours to thoroughly review all of the results before contacting the office for clarification of your results.  WALK-IN LAB HOURS  Monday through Thursday from 8:00 am -12:30 pm and 1:00 pm-4:30 pm and Friday from 8:00 am-12:00 pm.  Patients with office visits requiring labs will be seen before walk-in labs.  You Sando encounter longer than normal wait times. Please allow additional time. Wait times Hillery be shorter on  Monday and Thursday afternoons.  We do not book appointments for walk-in labs. We appreciate your patience and understanding with our staff.   Labs are drawn by [facility]. Please bring your co-pay at the time of your lab draw.  You [place]ure[place] receive a bill from [facility] for your lab work.  Please note if you are on Hydroxychloroquine and and an order has been placed for a Hydroxychloroquine level,  you will need to have it drawn 4 hours or more after your last dose.  If you wish to have your labs drawn at another location, please call the office 24 hours in advance so we can fax the orders.  The office is located at [address], [address], [place], [place] [address]   If you have any questions regarding directions or hours of operation,  please call [phone].   As a reminder, please drink plenty of water prior to coming for your lab work. Thanks!

## 2024-02-17 NOTE — Progress Notes (Unsigned)
 Please review and sign no print.

## 2024-02-17 NOTE — Progress Notes (Signed)
 [employer] PAP provided to patient at [facility]. Advised that [employer] may have update in forms in October which would deem this application ineligible. She plans to call [employer] and if no planned application update, she will complete and return the application provided today to the office  Sherry Pennant, PharmD, MPH, BCPS, CPP Clinical Pharmacist [facility])

## 2024-03-10 ENCOUNTER — Other Ambulatory Visit: Payer: Self-pay

## 2024-03-10 DIAGNOSIS — L409 Psoriasis, unspecified: Secondary | ICD-10-CM

## 2024-03-10 DIAGNOSIS — L4050 Arthropathic psoriasis, unspecified: Secondary | ICD-10-CM

## 2024-03-10 DIAGNOSIS — Z79899 Other long term (current) drug therapy: Secondary | ICD-10-CM

## 2024-03-10 MED ORDER — ENBREL SURECLICK 50 MG/ML ~~LOC~~ SOAJ: SUBCUTANEOUS | 0 refills | Status: DC

## 2024-03-10 NOTE — Telephone Encounter (Signed)
 Patient contacted the office to request a refill of Enbrel  be sent to [employer].   Last Fill: 12/23/2023  Labs: 12/09/2023 CMP WNL RBC count remains slightly low but has improved. Rest of CBC WNL.  TB Gold: 04/16/2023  TB Gold is negative   Next Visit: 07/21/2024  Last Visit: 02/17/2024  DX: Psoriatic arthritis (HCC)   Current Dose per office note 02/17/2024: Enbrel  SureClick 50 mg sq injections every 7 days   Okay to refill Enbrel ?

## 2024-03-11 ENCOUNTER — Other Ambulatory Visit: Payer: Self-pay

## 2024-03-16 ENCOUNTER — Telehealth: Payer: Self-pay | Admitting: *Deleted

## 2024-03-16 NOTE — Telephone Encounter (Signed)
 Patient contacted the office stating she has reached out to [employer] to set up shipment of her Enbrel  and they advised her they do not have a prescription. Patient advised the prescription was sent on March 10, 2024. Patient advised I would reach out to the pharmacy and follow up.   Reached out to [facility] and spoke with Dempsey. Dempsey advised they have received the prescription. Myles Dempsey patient had reached out to [employer] and they advised her they do not have the prescription. He advised they would be sending it over and patient could call to set up shipment in an hour. Called patient and advised.

## 2024-03-25 ENCOUNTER — Telehealth: Payer: Self-pay | Admitting: *Deleted

## 2024-03-25 NOTE — Telephone Encounter (Signed)
 Patient contacted the office and left message stating she received her refill for Enbrel  and only received a 30 day supply. Patient would like to know why. Patient states she only has 2 pens left.  Returned call to the patient and advised her that she is due to update labs. Patient states she will come Monday to update her labs. Patient advised once labs are updated we can send in a 90 day supply. Patient expressed understanding.

## 2024-03-29 ENCOUNTER — Other Ambulatory Visit: Payer: Self-pay | Admitting: *Deleted

## 2024-03-29 DIAGNOSIS — Z111 Encounter for screening for respiratory tuberculosis: Secondary | ICD-10-CM

## 2024-03-29 DIAGNOSIS — Z79899 Other long term (current) drug therapy: Secondary | ICD-10-CM

## 2024-03-29 DIAGNOSIS — Z9225 Personal history of immunosupression therapy: Secondary | ICD-10-CM

## 2024-03-30 ENCOUNTER — Other Ambulatory Visit: Payer: Self-pay

## 2024-03-30 ENCOUNTER — Ambulatory Visit: Payer: Self-pay | Admitting: Physician Assistant

## 2024-03-30 DIAGNOSIS — L4050 Arthropathic psoriasis, unspecified: Secondary | ICD-10-CM

## 2024-03-30 DIAGNOSIS — L409 Psoriasis, unspecified: Secondary | ICD-10-CM

## 2024-03-30 DIAGNOSIS — Z79899 Other long term (current) drug therapy: Secondary | ICD-10-CM

## 2024-03-30 MED ORDER — ENBREL SURECLICK 50 MG/ML ~~LOC~~ SOAJ: SUBCUTANEOUS | 0 refills | Status: AC

## 2024-03-30 NOTE — Telephone Encounter (Signed)
 Last Fill: 03/10/2024  Labs: 03/29/2024 CBC and CMP WNL   TB Gold: 03/29/2024 pending   04/16/2023  negative   Next Visit: 07/21/2024  Last Visit: 02/17/2024  IK:Ednmpjupr arthritis   Current Dose per office note on 02/17/2024: Enbrel  SureClick 50 mg sq injections every 7 days   Okay to refill Enbrel ?

## 2024-03-30 NOTE — Progress Notes (Signed)
 CBC and CMP WNL

## 2024-03-31 LAB — CBC WITH DIFFERENTIAL/PLATELET
Absolute Lymphocytes: 2616 {cells}/uL (ref 850–3900)
Absolute Monocytes: 632 {cells}/uL (ref 200–950)
Basophils Absolute: 37 {cells}/uL (ref 0–200)
Basophils Relative: 0.6 %
Eosinophils Absolute: 149 {cells}/uL (ref 15–500)
Eosinophils Relative: 2.4 %
HCT: 36.8 % (ref 35.0–45.0)
Hemoglobin: 12.5 g/dL (ref 11.7–15.5)
MCH: 32.2 pg (ref 27.0–33.0)
MCHC: 34 g/dL (ref 32.0–36.0)
MCV: 94.8 fL (ref 80.0–100.0)
MPV: 10.1 fL (ref 7.5–12.5)
Monocytes Relative: 10.2 %
Neutro Abs: 2765 {cells}/uL (ref 1500–7800)
Neutrophils Relative %: 44.6 %
Platelets: 245 Thousand/uL (ref 140–400)
RBC: 3.88 Million/uL (ref 3.80–5.10)
RDW: 12.5 % (ref 11.0–15.0)
Total Lymphocyte: 42.2 %
WBC: 6.2 Thousand/uL (ref 3.8–10.8)

## 2024-03-31 LAB — COMPREHENSIVE METABOLIC PANEL WITH GFR
AG Ratio: 1.5 (calc) (ref 1.0–2.5)
ALT: 17 U/L (ref 6–29)
AST: 22 U/L (ref 10–35)
Albumin: 4.3 g/dL (ref 3.6–5.1)
Alkaline phosphatase (APISO): 79 U/L (ref 37–153)
BUN: 16 mg/dL (ref 7–25)
CO2: 28 mmol/L (ref 20–32)
Calcium: 9.4 mg/dL (ref 8.6–10.4)
Chloride: 103 mmol/L (ref 98–110)
Creat: 0.68 mg/dL (ref 0.50–1.05)
Globulin: 2.8 g/dL (ref 1.9–3.7)
Glucose, Bld: 93 mg/dL (ref 65–99)
Potassium: 4 mmol/L (ref 3.5–5.3)
Sodium: 140 mmol/L (ref 135–146)
Total Bilirubin: 0.2 mg/dL (ref 0.2–1.2)
Total Protein: 7.1 g/dL (ref 6.1–8.1)
eGFR: 95 mL/min/1.73m2 (ref >=60)

## 2024-03-31 LAB — QUANTIFERON-TB GOLD PLUS
Mitogen-NIL: >10 [IU]/mL
NIL: 0.04 [IU]/mL
QuantiFERON-TB Gold Plus: NEGATIVE
TB1-NIL: <0 [IU]/mL
TB2-NIL: <0 [IU]/mL

## 2024-03-31 NOTE — Progress Notes (Signed)
 TB gold negative

## 2024-04-08 ENCOUNTER — Ambulatory Visit: Payer: Self-pay

## 2024-04-08 NOTE — Telephone Encounter (Signed)
 FYI Only or Action Required?: FYI only for provider: appointment scheduled on 04/09/2024 at 9:15 AM.  Patient was last seen in primary care on 09/09/2023 by Joyce Norleen BROCKS, MD.  Called Nurse Triage reporting Leg Pain.  Symptoms began 2 days ago.  Interventions attempted: Rest, hydration, or home remedies.  Symptoms are: unchanged.  Triage Disposition: See Physician Within 24 Hours  Patient/caregiver understands and will follow disposition?: Yes  Copied from CRM #[id number]. Topic: Clinical - Red Word Triage >> Apr 08, 2024 11:42 AM Kevelyn M wrote: Vericose veins is red, swollen and tender to the touch started 2 days ago. Right leg. Reason for Disposition  Localized pain, redness or hard lump along vein  Answer Assessment - Initial Assessment Questions Patient reports leg is red, swollen and tender to the touch. Patient endorses varicose veins in that leg.   1. ONSET: When did the pain start?      Started two days ago 2. LOCATION: Where is the pain located?      Right leg 3. PAIN: How bad is the pain?    (Scale 1-10; or mild, moderate, severe)     moderate 4. WORK OR EXERCISE: Has there been any recent work or exercise that involved this part of the body?      no 5. CAUSE: What do you think is causing the leg pain?     Varicose veins 6. OTHER SYMPTOMS: Do you have any other symptoms? (e.g., chest pain, back pain, breathing difficulty, swelling, rash, fever, numbness, weakness)     Swelling to the leg  Protocols used: Leg Pain-A-AH

## 2024-04-09 ENCOUNTER — Ambulatory Visit (INDEPENDENT_AMBULATORY_CARE_PROVIDER_SITE_OTHER): Admitting: Medical

## 2024-04-09 VITALS — BP 120/64 | HR 62 | Temp 97.2°F | Wt 202.0 lb

## 2024-04-09 DIAGNOSIS — M79604 Pain in right leg: Secondary | ICD-10-CM

## 2024-04-09 DIAGNOSIS — L4050 Arthropathic psoriasis, unspecified: Secondary | ICD-10-CM | POA: Diagnosis not present

## 2024-04-09 DIAGNOSIS — I8391 Asymptomatic varicose veins of right lower extremity: Secondary | ICD-10-CM

## 2024-04-09 DIAGNOSIS — M7989 Other specified soft tissue disorders: Secondary | ICD-10-CM | POA: Diagnosis not present

## 2024-04-09 DIAGNOSIS — I10 Essential (primary) hypertension: Secondary | ICD-10-CM | POA: Diagnosis not present

## 2024-04-09 NOTE — Progress Notes (Signed)
 Subjective:  Kimberly Butler is a 68 y.o. female who presents for Chief Complaint  Patient presents with   Acute Visit    Leg pain- red, tender to touch, feels a knot there. Swelling. Been there for 4 days     Kimberly Butler is a 68 year old female with varicose veins who presents with right leg tenderness.  She has been experiencing tenderness in her right leg, specifically in an area that feels 'knotty' and rigid, for about a week. The swelling in her leg reduces overnight, but the tenderness in the specific spot remains. No recent long travel, surgeries, or injuries that could have contributed to her symptoms.  Not on any hormone therapy  She has a history of varicose veins and has recently started wearing support hose again to manage her symptoms. She has not attempted any other treatments such as heat application. Her current medications include Enbrel  and methotrexate  for psoriatic arthritis. She is allergic to Reglan  and promethazine .  She maintains an active lifestyle, walking daily and attending the gym three times a week. She does not smoke and is not on any hormonal treatments. No trouble breathing.  No other aggravating or relieving factors.    No other c/o.   Past Medical History:  Diagnosis Date   Allergy     RHINITIS   Cancer (HCC)    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   Epilepsy (HCC)    hasn't had seizure in 20 years (01/15/17)   GERD (gastroesophageal reflux disease)    Hypertension    Obesity    Psoriasis    Psoriatic arthritis (HCC)    Smoker    Varicose veins    Current Outpatient Medications on File Prior to Visit  Medication Sig Dispense Refill   calcium carbonate (TUMS - DOSED IN MG ELEMENTAL CALCIUM) 500 MG chewable tablet Chew 1-2 tablets by mouth as needed for indigestion or heartburn (depends on indigestion if takes 1-2 tablets).     cholecalciferol (VITAMIN D ) 1000 units tablet Take 5,000 Units by mouth daily.     etanercept  (ENBREL  SURECLICK) 50 MG/ML injection INJECT  1 PEN SUBCUTANEOUSLY  WEEKLY 12 mL 0   folic acid  (FOLVITE ) 1 MG tablet TAKE 1 TABLET BY MOUTH EVERY DAY 90 tablet 0   hydrocortisone 2.5 % cream as needed.     losartan -hydrochlorothiazide  (HYZAAR) 50-12.5 MG tablet Take 1 tablet by mouth daily. 90 tablet 3   Methotrexate  Sodium (METHOTREXATE , PF,) 50 MG/2ML injection INJECT 0.3ML UNDER THE SKIN WEEKLY (DISCARD REMAINDER OF VIAL AFTER EACH DOSE) 8 mL 2   Multiple Vitamins-Minerals (MULTIVITAMIN WITH MINERALS) tablet Take 1 tablet by mouth daily.     phenytoin  (DILANTIN ) 100 MG ER capsule TAKE 2 CAPSULES BY MOUTH 2 TIMES DAILY. 360 capsule 3   Turmeric 500 MG CAPS Take 500 mg by mouth every other day.     No current facility-administered medications on file prior to visit.    The following portions of the patient's history were reviewed and updated as appropriate: allergies, current medications, past family history, past medical history, past social history, past surgical history and problem list.  ROS Otherwise as in subjective above    Objective: BP 120/64   Pulse 62   Temp (!) 97.2 F (36.2 C)   Wt 202 lb (91.6 kg)   SpO2 98%   BMI 32.60 kg/m   General appearance: alert, no distress, well developed, well nourished Neck: supple, no lymphadenopathy, no thyromegaly, no masses, no JVD Heart: RRR,  normal S1, S2, no murmurs Lungs: CTA bilaterally, no wheezes, rhonchi, or rales Pulses: 2+ radial pulses, 2+ pedal pulses, normal cap refill Ext: Moderate varicose veins of the right lower leg compared to the left, compression hose in place, right posterior medial calf with swelling asymmetric compared to left lower leg, right posterior medial calf with an area of palpable density, tendnerss, slight warmth, slight erythema -homans Left lower leg unremarkable   Assessment: Encounter Diagnoses  Name Primary?   Pain of right lower extremity Yes   Leg swelling    Psoriatic arthritis (HCC)    Primary hypertension    Varicose veins of  right lower extremity, unspecified whether complicated      Plan: Right lower extremity superficial thrombophlebitis and varicose veins with pain Likely superficial thrombophlebitis , but need to rule out DVT  - Order ultrasound of the right lower extremity to rule out deep vein thrombosis or urgent visit with [facility] today if possible. - Advise use of compression hose. - Recommend warm compresses and leg elevation for 4-5 days if ultrasound confirms superficial thrombophlebitis. - Instruct to take four baby aspirins daily for a week if superficial thrombophlebitis is confirmed.  Psoriatic arthritis On Enbrel  and methotrexate .  On high risk medication in general.   Necie was seen today for acute visit.  Diagnoses and all orders for this visit:  Pain of right lower extremity -     Ambulatory referral to Vascular Surgery  Leg swelling -     Ambulatory referral to Vascular Surgery  Psoriatic arthritis (HCC)  Primary hypertension  Varicose veins of right lower extremity, unspecified whether complicated -     Ambulatory referral to Vascular Surgery    Follow up: pending eval

## 2024-04-09 NOTE — Progress Notes (Signed)
 I was able to get patient scheduled for today at 12pm

## 2024-04-13 ENCOUNTER — Ambulatory Visit: Payer: Self-pay | Admitting: Family Medicine

## 2024-04-28 ENCOUNTER — Telehealth: Payer: Self-pay | Admitting: Pharmacist

## 2024-04-28 NOTE — Telephone Encounter (Signed)
 Patient will need to reapply for Enbrel  patient assistance. New applicaton will be released on Monday  Jillaine Waren, PharmD, MPH, BCPS, CPP Clinical Pharmacist [facility])

## 2024-05-19 ENCOUNTER — Other Ambulatory Visit: Payer: Self-pay | Admitting: Rheumatology

## 2024-05-19 DIAGNOSIS — Z79899 Other long term (current) drug therapy: Secondary | ICD-10-CM

## 2024-05-19 DIAGNOSIS — L4050 Arthropathic psoriasis, unspecified: Secondary | ICD-10-CM

## 2024-05-19 DIAGNOSIS — L409 Psoriasis, unspecified: Secondary | ICD-10-CM

## 2024-05-19 NOTE — Telephone Encounter (Signed)
 Last Fill: 02/17/2024  Labs: 03/29/2024 CBC and CMP WNL   Next Visit: 07/21/2024  Last Visit: 02/17/2024  DX: Psoriatic arthritis   Current Dose per office note on 02/17/2024: methotrexate  0.3 ml sq injections every week   Okay to refill Methotrexate ?

## 2024-05-20 NOTE — Telephone Encounter (Signed)
 Submitted a Prior Authorization renewal request to [facility] for ENBREL  via CoverMyMeds. Will update once we receive a response.  Key: [id number]  Per response: Prior Authorization Not Required

## 2024-05-25 NOTE — Telephone Encounter (Signed)
 Submitted Patient Assistance RENEWAL Application to [employer] for ENBREL  with patient portion, provider portion, insurance card copy, prior authorization approval, and current medication list. Will update patient when we receive a response.  Phone #: [phone] Fax #: [phone]

## 2024-06-08 NOTE — Telephone Encounter (Signed)
 Per automated system at [employer], all 2026 applications will be processed in 2026 (determination will be made no sooner than 2026)

## 2024-06-29 NOTE — Progress Notes (Unsigned)
 "  Office Visit Note  Patient: Kimberly Butler             Date of Birth: [birth year]           MRN: [id number]             PCP: Joyce Norleen BROCKS, MD Referring: Joyce Norleen BROCKS, MD Visit Date: 07/01/2024 Occupation: Data Unavailable  Subjective:  No chief complaint on file.   History of Present Illness: Kimberly Butler is a 69 y.o. female ***     Activities of Daily Living:  Patient reports morning stiffness for *** {minute/hour:19697}.   Patient {ACTIONS;DENIES/REPORTS:21021675::Denies} nocturnal pain.  Difficulty dressing/grooming: {ACTIONS;DENIES/REPORTS:21021675::Denies} Difficulty climbing stairs: {ACTIONS;DENIES/REPORTS:21021675::Denies} Difficulty getting out of chair: {ACTIONS;DENIES/REPORTS:21021675::Denies} Difficulty using hands for taps, buttons, cutlery, and/or writing: {ACTIONS;DENIES/REPORTS:21021675::Denies}  No Rheumatology ROS completed.   PMFS History:  Patient Active Problem List   Diagnosis Date Noted   Family history of breast cancer 07/11/2021   History of COVID-19 07/31/2020   Lactose intolerance in adult 11/18/2016   Post-menopausal 08/28/2016   High risk medication use 08/28/2016   History of vitamin D  deficiency 08/28/2016   History of gastroesophageal reflux (GERD) 08/28/2016   History of iron deficiency anemia 08/28/2016   Psoriasis 07/07/2014   Seizure disorder (HCC) 01/07/2011   Hypertension 01/07/2011   Psoriatic arthritis (HCC) 01/07/2011   Allergic rhinitis due to pollen 01/07/2011   Obesity (BMI 30-39.9) 01/07/2011    Past Medical History:  Diagnosis Date   Allergy     RHINITIS   Cancer (HCC)    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   Epilepsy (HCC)    hasn't had seizure in 20 years (01/15/17)   GERD (gastroesophageal reflux disease)    Hypertension    Obesity    Psoriasis    Psoriatic arthritis (HCC)    Smoker    Varicose veins     Family History  Problem Relation Age of Onset   Cancer Mother    Heart disease Mother    Arthritis  Father    Kidney failure Father    Dementia Father    Diabetes Father    Cancer Sister    Cancer Brother    Healthy Son    Past Surgical History:  Procedure Laterality Date   CATARACT EXTRACTION W/ INTRAOCULAR LENS IMPLANT Bilateral    CESAREAN SECTION     CHOLECYSTECTOMY N/A 01/23/2017   Procedure: LAPAROSCOPIC CHOLECYSTECTOMY;  Surgeon: Sebastian Moles, MD;  Location: [facility] OR;  Service: General;  Laterality: N/A;   TONSILECTOMY, ADENOIDECTOMY, BILATERAL MYRINGOTOMY AND TUBES     TUBAL LIGATION     Social History[1] Social History   Social History Narrative   Not on file     Immunization History  Administered Date(s) Administered   DTaP 02/06/1994, 11/15/2004   Fluad Quad(high Dose 65+) 05/23/2022   Influenza Inj Mdck Quad Pf 03/10/2017   Influenza Whole 07/24/2009   Influenza,inj,Quad PF,6+ Mos 03/29/2016, 03/24/2018, 03/22/2019, 03/22/2020   Influenza-Unspecified 03/10/2014, 03/10/2017   PNEUMOCOCCAL CONJUGATE-20 09/09/2023   PPD Test 10/20/1992   Pneumococcal Conjugate-13 12/18/2007   Pneumococcal Polysaccharide-23 03/31/2014   Tdap 03/29/2016   Zoster Recombinant(Shingrix ) 12/15/2018, 03/22/2019     Objective: Vital Signs: There were no vitals taken for this visit.   Physical Exam   Musculoskeletal Exam: ***  CDAI Exam: CDAI Score: -- Patient Global: --; Provider Global: -- Swollen: --; Tender: -- Joint Exam 07/01/2024   No joint exam has been documented for this visit   There is currently no information  documented on the homunculus. Go to the Rheumatology activity and complete the homunculus joint exam.  Investigation: No additional findings.  Imaging: No results found.  Recent Labs: Lab Results  Component Value Date   WBC 6.2 03/29/2024   HGB 12.5 03/29/2024   PLT 245 03/29/2024   NA 140 03/29/2024   K 4.0 03/29/2024   CL 103 03/29/2024   CO2 28 03/29/2024   GLUCOSE 93 03/29/2024   BUN 16 03/29/2024   CREATININE 0.68 03/29/2024    BILITOT 0.2 03/29/2024   ALKPHOS 102 01/06/2023   AST 22 03/29/2024   ALT 17 03/29/2024   PROT 7.1 03/29/2024   ALBUMIN 4.0 01/06/2023   CALCIUM 9.4 03/29/2024   GFRAA 85 11/01/2020   QFTBGOLDPLUS NEGATIVE 03/29/2024    Speciality Comments: No specialty comments available.  Procedures:  No procedures performed Allergies: Metoclopramide  and Promethazine    Assessment / Plan:     Visit Diagnoses: No diagnosis found.  Orders: No orders of the defined types were placed in this encounter.  No orders of the defined types were placed in this encounter.   Face-to-face time spent with patient was *** minutes. Greater than 50% of time was spent in counseling and coordination of care.  Follow-Up Instructions: No follow-ups on file.   Daved JAYSON Gavel, CMA  Note - This record has been created using Animal nutritionist.  Chart creation errors have been sought, but may not always  have been located. Such creation errors do not reflect on  the standard of medical care.    [1]  Social History Tobacco Use   Smoking status: Former    Current packs/day: 0.00    Average packs/day: 2.0 packs/day for 5.0 years (10.0 ttl pk-yrs)    Types: Cigarettes    Start date: 02/24/1988    Quit date: 02/23/1993    Years since quitting: 31.3    Passive exposure: Past   Smokeless tobacco: Never  Vaping Use   Vaping status: Never Used  Substance Use Topics   Alcohol use: No   Drug use: Never   "

## 2024-07-01 ENCOUNTER — Ambulatory Visit: Attending: Rheumatology | Admitting: Rheumatology

## 2024-07-01 ENCOUNTER — Encounter: Payer: Self-pay | Admitting: Rheumatology

## 2024-07-01 VITALS — BP 132/83 | HR 60 | Temp 97.8°F | Resp 16 | Ht 67.0 in | Wt 207.2 lb

## 2024-07-01 DIAGNOSIS — L409 Psoriasis, unspecified: Secondary | ICD-10-CM | POA: Diagnosis not present

## 2024-07-01 DIAGNOSIS — I10 Essential (primary) hypertension: Secondary | ICD-10-CM | POA: Diagnosis not present

## 2024-07-01 DIAGNOSIS — M170 Bilateral primary osteoarthritis of knee: Secondary | ICD-10-CM | POA: Diagnosis not present

## 2024-07-01 DIAGNOSIS — M7918 Myalgia, other site: Secondary | ICD-10-CM | POA: Diagnosis not present

## 2024-07-01 DIAGNOSIS — L4050 Arthropathic psoriasis, unspecified: Secondary | ICD-10-CM | POA: Insufficient documentation

## 2024-07-01 DIAGNOSIS — M542 Cervicalgia: Secondary | ICD-10-CM | POA: Insufficient documentation

## 2024-07-01 DIAGNOSIS — Z131 Encounter for screening for diabetes mellitus: Secondary | ICD-10-CM | POA: Diagnosis not present

## 2024-07-01 DIAGNOSIS — M62838 Other muscle spasm: Secondary | ICD-10-CM

## 2024-07-01 DIAGNOSIS — M19042 Primary osteoarthritis, left hand: Secondary | ICD-10-CM | POA: Insufficient documentation

## 2024-07-01 DIAGNOSIS — E559 Vitamin D deficiency, unspecified: Secondary | ICD-10-CM | POA: Insufficient documentation

## 2024-07-01 DIAGNOSIS — Z8719 Personal history of other diseases of the digestive system: Secondary | ICD-10-CM | POA: Insufficient documentation

## 2024-07-01 DIAGNOSIS — M19041 Primary osteoarthritis, right hand: Secondary | ICD-10-CM | POA: Diagnosis not present

## 2024-07-01 DIAGNOSIS — Z862 Personal history of diseases of the blood and blood-forming organs and certain disorders involving the immune mechanism: Secondary | ICD-10-CM | POA: Diagnosis not present

## 2024-07-01 DIAGNOSIS — Z8669 Personal history of other diseases of the nervous system and sense organs: Secondary | ICD-10-CM | POA: Insufficient documentation

## 2024-07-01 DIAGNOSIS — M7061 Trochanteric bursitis, right hip: Secondary | ICD-10-CM

## 2024-07-01 DIAGNOSIS — Z79899 Other long term (current) drug therapy: Secondary | ICD-10-CM | POA: Insufficient documentation

## 2024-07-01 MED ORDER — LIDOCAINE HCL 1 % IJ SOLN
0.5000 mL | INTRAMUSCULAR | Status: AC | PRN
  Administered 2024-07-01: .5 mL

## 2024-07-01 MED ORDER — TRIAMCINOLONE ACETONIDE 40 MG/ML IJ SUSP
20.0000 mg | INTRAMUSCULAR | Status: AC | PRN
  Administered 2024-07-01: 20 mg via INTRAMUSCULAR

## 2024-07-01 NOTE — Patient Instructions (Addendum)
 Standing Labs We placed an order today for your standing lab work.   Please have your standing labs drawn in April and every 3 months  Please have your labs drawn 2 weeks prior to your appointment so that the provider can discuss your lab results at your appointment, if possible.  Please note that you may see your imaging and lab results in MyChart before we have reviewed them. We will contact you once all results are reviewed. Please allow our office up to 72 hours to thoroughly review all of the results before contacting the office for clarification of your results.  WALK-IN LAB HOURS  Monday through Thursday from 8:00 am - 4:30 pm and Friday from 8:00 am-12:00 pm.  Patients with office visits requiring labs will be seen before walk-in labs.  You may encounter longer than normal wait times. Please allow additional time. Wait times may be shorter on  Monday and Thursday afternoons.  We do not book appointments for walk-in labs. We appreciate your patience and understanding with our staff.   Labs are drawn by [facility]. Please bring your co-pay at the time of your lab draw.  You may receive a bill from [facility] for your lab work.  Please note if you are on Hydroxychloroquine and and an order has been placed for a Hydroxychloroquine level,  you will need to have it drawn 4 hours or more after your last dose.  If you wish to have your labs drawn at another location, please call the office 24 hours in advance so we can fax the orders.  The office is located at [address], [place], [place] [address]   If you have any questions regarding directions or hours of operation,  please call [phone].   As a reminder, please drink plenty of water prior to coming for your lab work. Thanks!   Vaccines You are taking a medication(s) that can suppress your immune system.  The following immunizations are recommended: Flu annually Covid-19  RSV Td/Tdap (tetanus, diphtheria, pertussis)  every 10 years Pneumonia (Prevnar 15 then Pneumovax 23 at least 1 year apart.  Alternatively, can take Prevnar 20 without needing additional dose) Shingrix : 2 doses from 4 weeks to 6 months apart  Please check with your PCP to make sure you are up to date.   If you have signs or symptoms of an infection or start antibiotics: First, call your PCP for workup of your infection. Hold your medication through the infection, until you complete your antibiotics, and until symptoms resolve if you take the following: Injectable medication (Actemra, Benlysta, Cimzia, Cosentyx, Enbrel , Humira, Kevzara, Orencia, Remicade, Simponi, Stelara, Taltz, Tremfya) Methotrexate  Leflunomide (Arava) Mycophenolate (Cellcept) Earma, Olumiant, or Rinvoq  Please get an annual skin examination to screen for skin cancer while you are on Enbrel .  Please use sunscreen and sun protection.  Cervical Strain and Sprain Rehab Ask your health care provider which exercises are safe for you. Do exercises exactly as told by your health care provider and adjust them as directed. It is normal to feel mild stretching, pulling, tightness, or discomfort as you do these exercises. Stop right away if you feel sudden pain or your pain gets worse. Do not begin these exercises until told by your health care provider. Stretching and range-of-motion exercises Cervical side bending  Using good posture, sit on a stable chair or stand up. Without moving your shoulders, slowly tilt your left / right ear to your shoulder until you feel a stretch in  the neck muscles on the opposite side. You should be looking straight ahead. Hold for __________ seconds. Repeat with the other side of your neck. Repeat __________ times. Complete this exercise __________ times a day. Cervical rotation  Using good posture, sit on a stable chair or stand up. Slowly turn your head to the side as if you are looking over your left / right shoulder. Keep your eyes  level with the ground. Stop when you feel a stretch along the side and the back of your neck. Hold for __________ seconds. Repeat this by turning to your other side. Repeat __________ times. Complete this exercise __________ times a day. Thoracic extension and pectoral stretch  Roll a towel or a small blanket so it is about 4 inches (10 cm) in diameter. Lie down on your back on a firm surface. Put the towel in the middle of your back across your spine. It should not be under your shoulder blades. Put your hands behind your head and let your elbows fall out to your sides. Hold for __________ seconds. Repeat __________ times. Complete this exercise __________ times a day. Strengthening exercises Upper cervical flexion  Lie on your back with a thin pillow behind your head or a small, rolled-up towel under your neck. Gently tuck your chin toward your chest and nod your head down to look toward your feet. Do not lift your head off the pillow. Hold for __________ seconds. Release the tension slowly. Relax your neck muscles completely before you repeat this exercise. Repeat __________ times. Complete this exercise __________ times a day. Cervical extension  Stand about 6 inches (15 cm) away from a wall, with your back facing the wall. Place a soft object, about 6-8 inches (15-20 cm) in diameter, between the back of your head and the wall. A soft object could be a small pillow, a ball, or a folded towel. Gently tilt your head back and press into the soft object. Keep your jaw and forehead relaxed. Hold for __________ seconds. Release the tension slowly. Relax your neck muscles completely before you repeat this exercise. Repeat __________ times. Complete this exercise __________ times a day. Posture and body mechanics Body mechanics refer to the movements and positions of your body while you do your daily activities. Posture is part of body mechanics. Good posture and healthy body mechanics can  help to relieve stress in your body's tissues and joints. Good posture means that your spine is in its natural S-curve position (your spine is neutral), your shoulders are pulled back slightly, and your head is not tipped forward. The following are general guidelines for using improved posture and body mechanics in your everyday activities. Sitting  When sitting, keep your spine neutral and keep your feet flat on the floor. Use a footrest, if needed, and keep your thighs parallel to the floor. Avoid rounding your shoulders. Avoid tilting your head forward. When working at a desk or a computer, keep your desk at a height where your hands are slightly lower than your elbows. Slide your chair under your desk so you are close enough to maintain good posture. When working at a computer, place your monitor at a height where you are looking straight ahead and you do not have to tilt your head forward or downward to look at the screen. Standing  When standing, keep your spine neutral and keep your feet about hip-width apart. Keep a slight bend in your knees. Your ears, shoulders, and hips should line up. When  you do a task in which you stand in one place for a long time, place one foot up on a stable object that is 2-4 inches (5-10 cm) high, such as a footstool. This helps keep your spine neutral. Resting When lying down and resting, avoid positions that are most painful for you. Try to support your neck in a neutral position. You can use a contour pillow or a small rolled-up towel. Your pillow should support your neck but not push on it. This information is not intended to replace advice given to you by your health care provider. Make sure you discuss any questions you have with your health care provider. Document Revised: 09/30/2022 Document Reviewed: 12/17/2021 Elsevier Patient Education  2024 [employer].

## 2024-07-02 ENCOUNTER — Ambulatory Visit: Payer: Self-pay | Admitting: Rheumatology

## 2024-07-02 LAB — COMPREHENSIVE METABOLIC PANEL WITH GFR
AG Ratio: 1.4 (calc) (ref 1.0–2.5)
ALT: 16 U/L (ref 6–29)
AST: 20 U/L (ref 10–35)
Albumin: 4.2 g/dL (ref 3.6–5.1)
Alkaline phosphatase (APISO): 97 U/L (ref 37–153)
BUN: 19 mg/dL (ref 7–25)
CO2: 31 mmol/L (ref 20–32)
Calcium: 10.2 mg/dL (ref 8.6–10.4)
Chloride: 102 mmol/L (ref 98–110)
Creat: 0.6 mg/dL (ref 0.50–1.05)
Globulin: 3.1 g/dL (ref 1.9–3.7)
Glucose, Bld: 86 mg/dL (ref 65–99)
Potassium: 4.4 mmol/L (ref 3.5–5.3)
Sodium: 140 mmol/L (ref 135–146)
Total Bilirubin: 0.2 mg/dL (ref 0.2–1.2)
Total Protein: 7.3 g/dL (ref 6.1–8.1)
eGFR: 98 mL/min/1.73m2

## 2024-07-02 LAB — CBC WITH DIFFERENTIAL/PLATELET
Absolute Lymphocytes: 3241 {cells}/uL (ref 850–3900)
Absolute Monocytes: 595 {cells}/uL (ref 200–950)
Basophils Absolute: 42 {cells}/uL (ref 0–200)
Basophils Relative: 0.6 %
Eosinophils Absolute: 189 {cells}/uL (ref 15–500)
Eosinophils Relative: 2.7 %
HCT: 36.5 % (ref 35.9–46.0)
Hemoglobin: 12.2 g/dL (ref 11.7–15.5)
MCH: 31.6 pg (ref 27.0–33.0)
MCHC: 33.4 g/dL (ref 31.6–35.4)
MCV: 94.6 fL (ref 81.4–101.7)
MPV: 10.2 fL (ref 7.5–12.5)
Monocytes Relative: 8.5 %
Neutro Abs: 2933 {cells}/uL (ref 1500–7800)
Neutrophils Relative %: 41.9 %
Platelets: 271 Thousand/uL (ref 140–400)
RBC: 3.86 Million/uL (ref 3.80–5.10)
RDW: 12 % (ref 11.0–15.0)
Total Lymphocyte: 46.3 %
WBC: 7 Thousand/uL (ref 3.8–10.8)

## 2024-07-02 LAB — HEMOGLOBIN A1C
Hgb A1c MFr Bld: 5.2 %
Mean Plasma Glucose: 103 mg/dL
eAG (mmol/L): 5.7 mmol/L

## 2024-07-12 NOTE — Telephone Encounter (Signed)
 Per [email or web address], application for Enbrel  PAP has been received by [employer]. Undergoing review

## 2024-07-21 ENCOUNTER — Ambulatory Visit: Admitting: Rheumatology

## 2024-09-13 ENCOUNTER — Ambulatory Visit: Payer: Self-pay | Admitting: Family Medicine

## 2024-11-30 ENCOUNTER — Ambulatory Visit: Admitting: Rheumatology
